# Patient Record
Sex: Male | Born: 1955
Health system: Southern US, Community
[De-identification: ages and names within clinical notes are randomized; demographics above are authoritative.]

## PROBLEM LIST (undated history)

## (undated) DIAGNOSIS — Z87442 Personal history of urinary calculi: Secondary | ICD-10-CM

## (undated) DIAGNOSIS — Z860100 Personal history of colon polyps, unspecified: Secondary | ICD-10-CM

## (undated) DIAGNOSIS — K219 Gastro-esophageal reflux disease without esophagitis: Secondary | ICD-10-CM

## (undated) DIAGNOSIS — F419 Anxiety disorder, unspecified: Secondary | ICD-10-CM

## (undated) DIAGNOSIS — N200 Calculus of kidney: Secondary | ICD-10-CM

## (undated) DIAGNOSIS — E785 Hyperlipidemia, unspecified: Secondary | ICD-10-CM

## (undated) DIAGNOSIS — C801 Malignant (primary) neoplasm, unspecified: Secondary | ICD-10-CM

## (undated) DIAGNOSIS — Z85828 Personal history of other malignant neoplasm of skin: Secondary | ICD-10-CM

## (undated) DIAGNOSIS — I4510 Unspecified right bundle-branch block: Secondary | ICD-10-CM

## (undated) DIAGNOSIS — F32A Depression, unspecified: Secondary | ICD-10-CM

## (undated) DIAGNOSIS — M1990 Unspecified osteoarthritis, unspecified site: Secondary | ICD-10-CM

## (undated) HISTORY — DX: Malignant (primary) neoplasm, unspecified: C80.1

## (undated) HISTORY — DX: Hyperlipidemia, unspecified: E78.5

## (undated) HISTORY — PX: SHOULDER SURGERY: SHX246

## (undated) HISTORY — PX: COLONOSCOPY: SHX174

## (undated) HISTORY — PX: CARDIOVASCULAR STRESS TEST: SHX262

## (undated) HISTORY — DX: Calculus of kidney: N20.0

## (undated) HISTORY — DX: Depression, unspecified: F32.A

## (undated) HISTORY — DX: Personal history of other malignant neoplasm of skin: Z85.828

## (undated) HISTORY — PX: TONSILLECTOMY AND ADENOIDECTOMY: SUR1326

## (undated) HISTORY — DX: Personal history of colonic polyps: Z86.010

## (undated) HISTORY — DX: Gastro-esophageal reflux disease without esophagitis: K21.9

## (undated) HISTORY — DX: Personal history of colon polyps, unspecified: Z86.0100

## (undated) HISTORY — PX: UPPER GASTROINTESTINAL ENDOSCOPY: SHX188

---

## 1998-10-05 ENCOUNTER — Encounter (HOSPITAL_COMMUNITY): Admission: RE | Admit: 1998-10-05 | Discharge: 1999-01-03 | Payer: Self-pay

## 2005-02-28 ENCOUNTER — Ambulatory Visit: Payer: Self-pay | Admitting: Internal Medicine

## 2005-06-27 ENCOUNTER — Ambulatory Visit: Payer: Self-pay | Admitting: Internal Medicine

## 2005-07-04 ENCOUNTER — Ambulatory Visit: Payer: Self-pay | Admitting: Internal Medicine

## 2005-07-11 ENCOUNTER — Ambulatory Visit: Payer: Self-pay | Admitting: Internal Medicine

## 2005-07-25 ENCOUNTER — Ambulatory Visit: Payer: Self-pay | Admitting: Internal Medicine

## 2005-09-07 ENCOUNTER — Ambulatory Visit: Payer: Self-pay | Admitting: Internal Medicine

## 2005-09-22 ENCOUNTER — Ambulatory Visit: Payer: Self-pay | Admitting: Internal Medicine

## 2005-11-17 ENCOUNTER — Ambulatory Visit: Payer: Self-pay | Admitting: Internal Medicine

## 2005-12-01 ENCOUNTER — Ambulatory Visit: Payer: Self-pay | Admitting: Internal Medicine

## 2006-01-16 ENCOUNTER — Ambulatory Visit: Payer: Self-pay | Admitting: Internal Medicine

## 2006-02-23 ENCOUNTER — Ambulatory Visit: Payer: Self-pay | Admitting: Internal Medicine

## 2006-06-06 ENCOUNTER — Ambulatory Visit: Payer: Self-pay | Admitting: Internal Medicine

## 2006-07-02 ENCOUNTER — Ambulatory Visit: Payer: Self-pay | Admitting: Internal Medicine

## 2006-08-14 DIAGNOSIS — E785 Hyperlipidemia, unspecified: Secondary | ICD-10-CM | POA: Insufficient documentation

## 2006-08-14 DIAGNOSIS — R1013 Epigastric pain: Secondary | ICD-10-CM

## 2006-08-14 DIAGNOSIS — K3189 Other diseases of stomach and duodenum: Secondary | ICD-10-CM

## 2006-10-05 ENCOUNTER — Ambulatory Visit: Payer: Self-pay | Admitting: Internal Medicine

## 2007-03-19 ENCOUNTER — Telehealth (INDEPENDENT_AMBULATORY_CARE_PROVIDER_SITE_OTHER): Payer: Self-pay | Admitting: *Deleted

## 2007-04-01 ENCOUNTER — Ambulatory Visit: Payer: Self-pay | Admitting: Internal Medicine

## 2007-04-02 ENCOUNTER — Encounter (INDEPENDENT_AMBULATORY_CARE_PROVIDER_SITE_OTHER): Payer: Self-pay | Admitting: *Deleted

## 2007-04-02 LAB — CONVERTED CEMR LAB
ALT: 21 units/L (ref 0–53)
Triglycerides: 58 mg/dL (ref 0–149)

## 2007-04-09 ENCOUNTER — Ambulatory Visit: Payer: Self-pay | Admitting: Internal Medicine

## 2007-04-09 LAB — CONVERTED CEMR LAB: HDL goal, serum: 40 mg/dL

## 2007-08-27 ENCOUNTER — Encounter: Payer: Self-pay | Admitting: Internal Medicine

## 2007-09-03 ENCOUNTER — Ambulatory Visit: Payer: Self-pay | Admitting: Internal Medicine

## 2007-09-03 DIAGNOSIS — R9431 Abnormal electrocardiogram [ECG] [EKG]: Secondary | ICD-10-CM

## 2007-09-04 ENCOUNTER — Telehealth: Payer: Self-pay | Admitting: Internal Medicine

## 2007-09-05 ENCOUNTER — Encounter (INDEPENDENT_AMBULATORY_CARE_PROVIDER_SITE_OTHER): Payer: Self-pay | Admitting: *Deleted

## 2007-09-06 ENCOUNTER — Encounter: Payer: Self-pay | Admitting: Internal Medicine

## 2007-09-06 ENCOUNTER — Ambulatory Visit: Payer: Self-pay

## 2007-09-09 ENCOUNTER — Telehealth (INDEPENDENT_AMBULATORY_CARE_PROVIDER_SITE_OTHER): Payer: Self-pay | Admitting: *Deleted

## 2007-09-10 ENCOUNTER — Encounter (INDEPENDENT_AMBULATORY_CARE_PROVIDER_SITE_OTHER): Payer: Self-pay | Admitting: *Deleted

## 2007-09-11 ENCOUNTER — Ambulatory Visit: Payer: Self-pay | Admitting: Gastroenterology

## 2007-09-11 ENCOUNTER — Ambulatory Visit: Payer: Self-pay | Admitting: Internal Medicine

## 2007-09-11 ENCOUNTER — Encounter (INDEPENDENT_AMBULATORY_CARE_PROVIDER_SITE_OTHER): Payer: Self-pay | Admitting: *Deleted

## 2007-09-12 ENCOUNTER — Ambulatory Visit: Payer: Self-pay | Admitting: Cardiovascular Disease

## 2007-09-17 ENCOUNTER — Encounter: Payer: Self-pay | Admitting: Internal Medicine

## 2007-09-17 ENCOUNTER — Ambulatory Visit: Payer: Self-pay | Admitting: Internal Medicine

## 2007-09-23 ENCOUNTER — Encounter: Payer: Self-pay | Admitting: Cardiovascular Disease

## 2007-09-23 ENCOUNTER — Ambulatory Visit: Payer: Self-pay

## 2007-10-08 ENCOUNTER — Ambulatory Visit: Payer: Self-pay | Admitting: Cardiovascular Disease

## 2007-10-23 ENCOUNTER — Ambulatory Visit: Payer: Self-pay | Admitting: Internal Medicine

## 2007-10-23 DIAGNOSIS — Z87442 Personal history of urinary calculi: Secondary | ICD-10-CM | POA: Insufficient documentation

## 2007-10-23 LAB — CONVERTED CEMR LAB
Nitrite: NEGATIVE
Protein, U semiquant: NEGATIVE
Urobilinogen, UA: NEGATIVE

## 2007-10-24 ENCOUNTER — Encounter: Payer: Self-pay | Admitting: Internal Medicine

## 2007-10-28 ENCOUNTER — Encounter (INDEPENDENT_AMBULATORY_CARE_PROVIDER_SITE_OTHER): Payer: Self-pay | Admitting: *Deleted

## 2007-11-21 ENCOUNTER — Ambulatory Visit: Payer: Self-pay | Admitting: Internal Medicine

## 2007-11-21 LAB — CONVERTED CEMR LAB
ALT: 27 units/L (ref 0–53)
AST: 24 units/L (ref 0–37)
LDL Cholesterol: 87 mg/dL (ref 0–99)
Total CHOL/HDL Ratio: 2.5
VLDL: 10 mg/dL (ref 0–40)

## 2007-11-26 ENCOUNTER — Encounter: Payer: Self-pay | Admitting: Internal Medicine

## 2007-11-28 ENCOUNTER — Encounter (INDEPENDENT_AMBULATORY_CARE_PROVIDER_SITE_OTHER): Payer: Self-pay | Admitting: *Deleted

## 2007-12-09 HISTORY — PX: EXTRACORPOREAL SHOCK WAVE LITHOTRIPSY: SHX1557

## 2007-12-10 ENCOUNTER — Emergency Department (HOSPITAL_COMMUNITY): Admission: EM | Admit: 2007-12-10 | Discharge: 2007-12-10 | Payer: Self-pay | Admitting: Emergency Medicine

## 2007-12-10 ENCOUNTER — Telehealth: Payer: Self-pay | Admitting: Internal Medicine

## 2007-12-12 ENCOUNTER — Encounter: Payer: Self-pay | Admitting: Internal Medicine

## 2007-12-13 ENCOUNTER — Telehealth: Payer: Self-pay | Admitting: Internal Medicine

## 2007-12-13 ENCOUNTER — Telehealth (INDEPENDENT_AMBULATORY_CARE_PROVIDER_SITE_OTHER): Payer: Self-pay | Admitting: *Deleted

## 2007-12-16 ENCOUNTER — Ambulatory Visit (HOSPITAL_COMMUNITY): Admission: RE | Admit: 2007-12-16 | Discharge: 2007-12-16 | Payer: Self-pay | Admitting: Urology

## 2007-12-17 ENCOUNTER — Telehealth (INDEPENDENT_AMBULATORY_CARE_PROVIDER_SITE_OTHER): Payer: Self-pay | Admitting: *Deleted

## 2007-12-17 ENCOUNTER — Encounter: Payer: Self-pay | Admitting: Internal Medicine

## 2007-12-17 DIAGNOSIS — K7689 Other specified diseases of liver: Secondary | ICD-10-CM

## 2007-12-18 ENCOUNTER — Telehealth: Payer: Self-pay | Admitting: Internal Medicine

## 2007-12-19 ENCOUNTER — Encounter: Payer: Self-pay | Admitting: Internal Medicine

## 2007-12-20 ENCOUNTER — Telehealth: Payer: Self-pay | Admitting: Internal Medicine

## 2007-12-24 ENCOUNTER — Telehealth (INDEPENDENT_AMBULATORY_CARE_PROVIDER_SITE_OTHER): Payer: Self-pay | Admitting: *Deleted

## 2008-01-06 ENCOUNTER — Ambulatory Visit: Payer: Self-pay | Admitting: Cardiovascular Disease

## 2008-01-14 DIAGNOSIS — I4510 Unspecified right bundle-branch block: Secondary | ICD-10-CM | POA: Insufficient documentation

## 2008-01-14 DIAGNOSIS — K219 Gastro-esophageal reflux disease without esophagitis: Secondary | ICD-10-CM

## 2008-01-15 ENCOUNTER — Ambulatory Visit: Payer: Self-pay | Admitting: Internal Medicine

## 2008-01-29 ENCOUNTER — Telehealth (INDEPENDENT_AMBULATORY_CARE_PROVIDER_SITE_OTHER): Payer: Self-pay | Admitting: *Deleted

## 2008-02-20 ENCOUNTER — Telehealth: Payer: Self-pay | Admitting: Internal Medicine

## 2008-02-21 ENCOUNTER — Encounter: Payer: Self-pay | Admitting: Internal Medicine

## 2008-03-27 ENCOUNTER — Ambulatory Visit: Payer: Self-pay | Admitting: *Deleted

## 2008-03-27 LAB — CONVERTED CEMR LAB
Bilirubin Urine: NEGATIVE
Glucose, Urine, Semiquant: NEGATIVE
Nitrite: NEGATIVE
Protein, U semiquant: NEGATIVE
Specific Gravity, Urine: 1.015
pH: 6

## 2008-03-28 ENCOUNTER — Encounter (INDEPENDENT_AMBULATORY_CARE_PROVIDER_SITE_OTHER): Payer: Self-pay | Admitting: *Deleted

## 2008-04-04 ENCOUNTER — Ambulatory Visit (HOSPITAL_COMMUNITY): Admission: EM | Admit: 2008-04-04 | Discharge: 2008-04-04 | Payer: Self-pay | Admitting: Emergency Medicine

## 2008-04-04 HISTORY — PX: OTHER SURGICAL HISTORY: SHX169

## 2008-04-06 ENCOUNTER — Telehealth (INDEPENDENT_AMBULATORY_CARE_PROVIDER_SITE_OTHER): Payer: Self-pay | Admitting: *Deleted

## 2008-06-26 ENCOUNTER — Ambulatory Visit: Payer: Self-pay | Admitting: Internal Medicine

## 2008-06-30 ENCOUNTER — Telehealth (INDEPENDENT_AMBULATORY_CARE_PROVIDER_SITE_OTHER): Payer: Self-pay | Admitting: *Deleted

## 2008-07-13 ENCOUNTER — Encounter: Payer: Self-pay | Admitting: Cardiovascular Disease

## 2008-07-13 ENCOUNTER — Ambulatory Visit: Payer: Self-pay | Admitting: Cardiovascular Disease

## 2008-07-22 ENCOUNTER — Telehealth (INDEPENDENT_AMBULATORY_CARE_PROVIDER_SITE_OTHER): Payer: Self-pay | Admitting: *Deleted

## 2008-08-28 ENCOUNTER — Ambulatory Visit: Payer: Self-pay | Admitting: Internal Medicine

## 2008-08-28 DIAGNOSIS — Z86010 Personal history of colonic polyps: Secondary | ICD-10-CM

## 2008-08-28 LAB — CONVERTED CEMR LAB: LDL Goal: 100 mg/dL

## 2008-09-13 LAB — CONVERTED CEMR LAB
Alkaline Phosphatase: 58 units/L (ref 39–117)
Bilirubin, Direct: 0.1 mg/dL (ref 0.0–0.3)
Eosinophils Absolute: 0.1 10*3/uL (ref 0.0–0.7)
GFR calc Af Amer: 101 mL/min
GFR calc non Af Amer: 83 mL/min
HCT: 46.4 % (ref 39.0–52.0)
HDL: 70.7 mg/dL (ref >39.0)
Monocytes Absolute: 0.5 10*3/uL (ref 0.1–1.0)
Monocytes Relative: 8 % (ref 3.0–12.0)
Platelets: 192 10*3/uL (ref 150–400)
Potassium: 4.7 meq/L (ref 3.5–5.1)
RDW: 12.6 % (ref 11.5–14.6)
Sodium: 141 meq/L (ref 135–145)
Total CHOL/HDL Ratio: 2.4
Triglycerides: 57 mg/dL (ref 0–149)
VLDL: 11 mg/dL (ref 0–40)

## 2008-09-14 ENCOUNTER — Encounter (INDEPENDENT_AMBULATORY_CARE_PROVIDER_SITE_OTHER): Payer: Self-pay | Admitting: *Deleted

## 2008-11-08 IMAGING — CT CT PELVIS W/O CM
2 of 4 series · 17 of 46 positions shown, 19 images · non-contrast
Comparison: 12/10/2007

CT ABDOMEN

CLINICAL DATA: Left flank pain.  History of renal calculi.

CT ABDOMEN AND PELVIS WITHOUT CONTRAST
TECHNIQUE: Multidetector CT imaging of the abdomen and pelvis was
performed following the standard
protocol without intravenous contrast.

[Series 2: stone_wo 5.0 b40f st · axial · 0.71mm/px · z∈[-486,-74]mm · 14 of 113 slices shown, 16 images]
[im 5/113  soft-tissue]
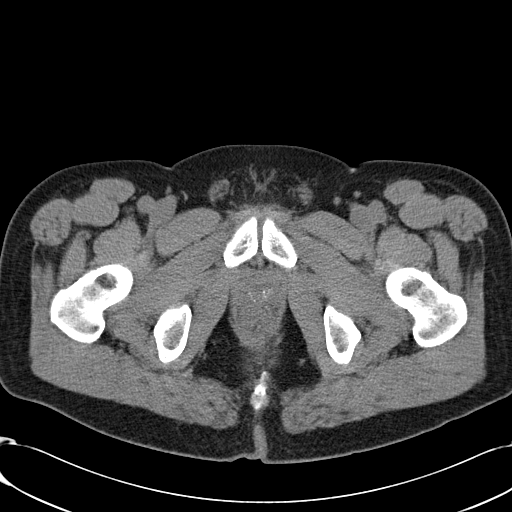
[im 5/113  bone]
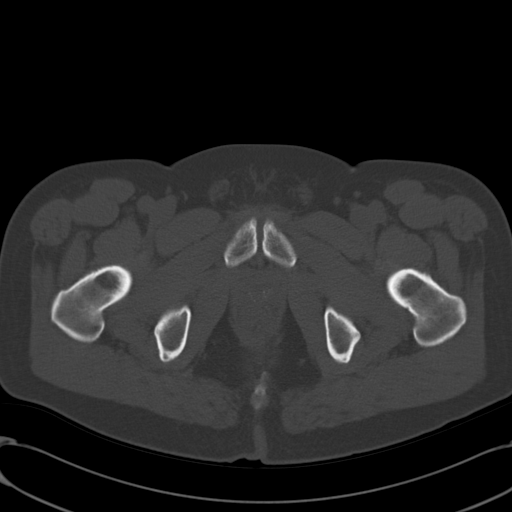
[im 15/113  soft-tissue]
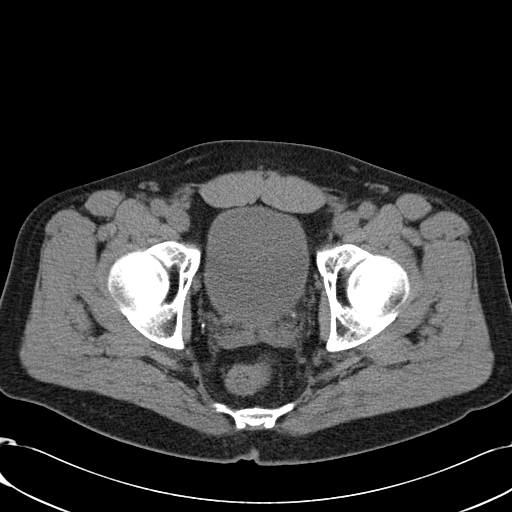
[im 24/113  soft-tissue]
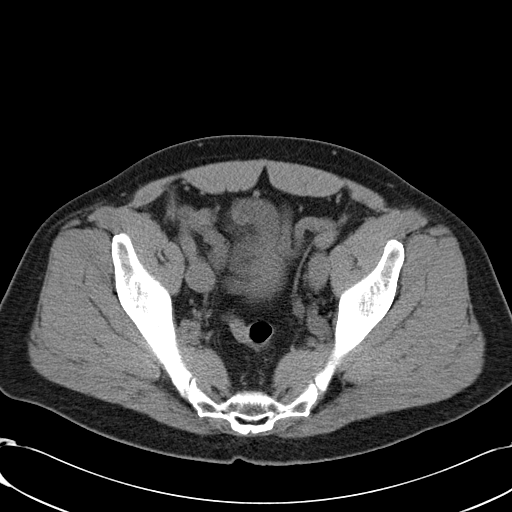
[im 29/113  soft-tissue]
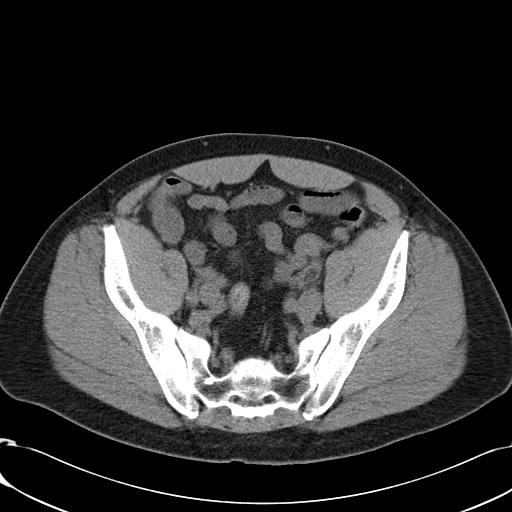
[im 38/113  soft-tissue]
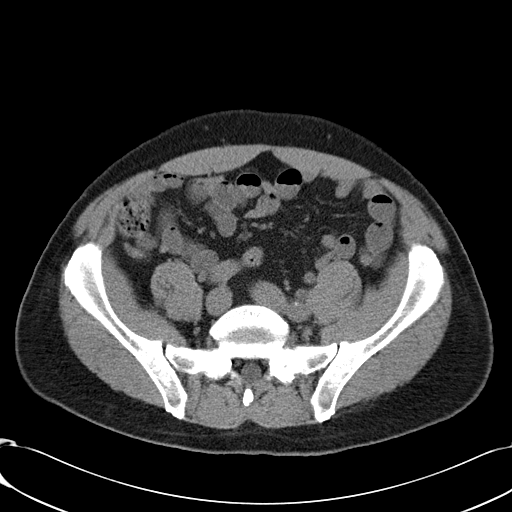
[im 47/113  soft-tissue]
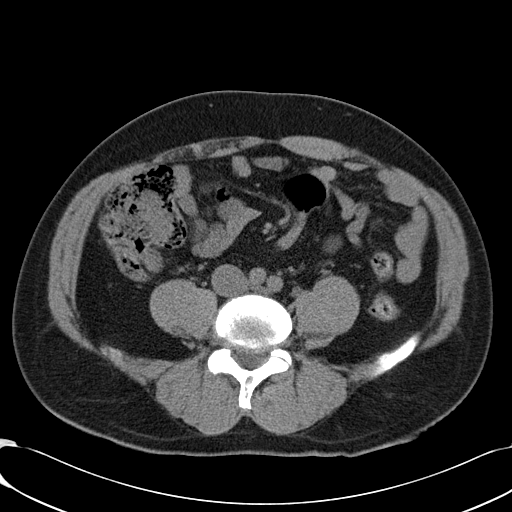
[im 52/113  soft-tissue]
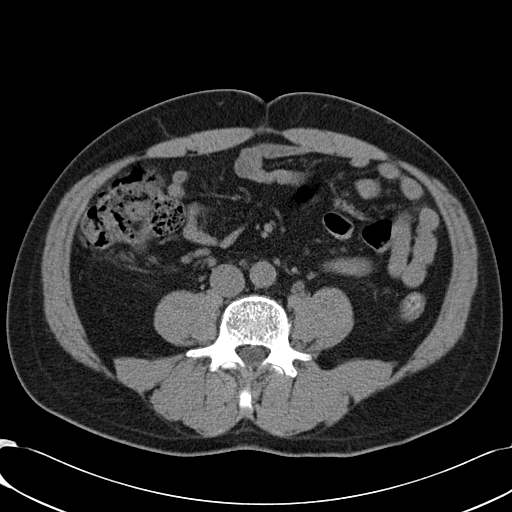
[im 61/113  soft-tissue]
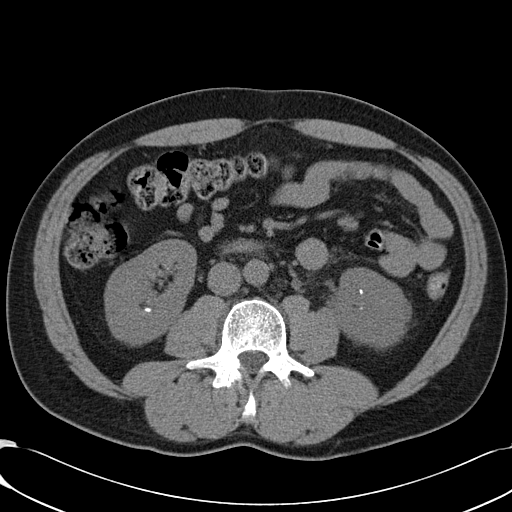
[im 66/113  soft-tissue]
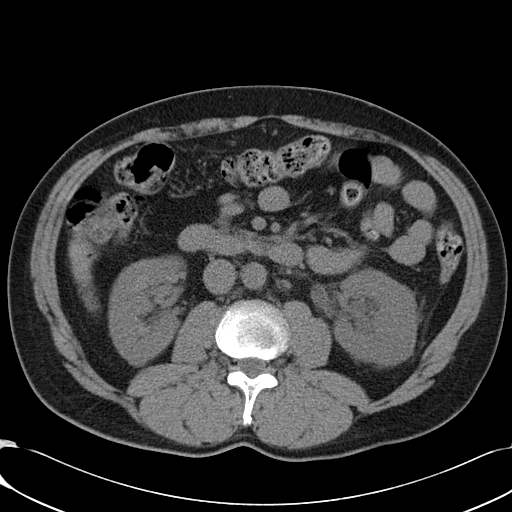
[im 66/113  bone]
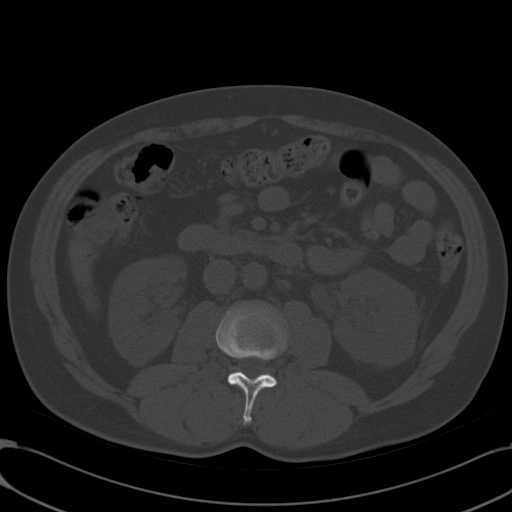
[im 75/113  soft-tissue]
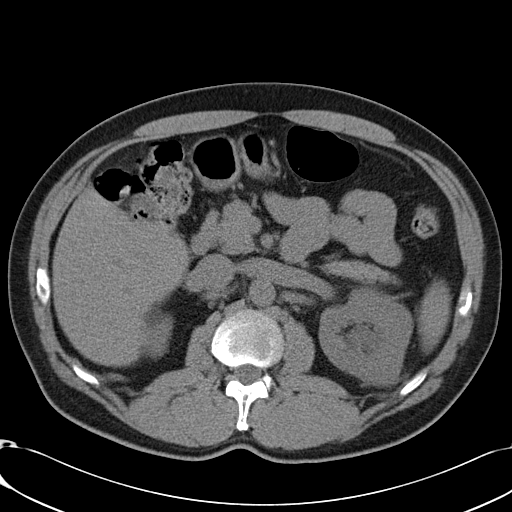
[im 85/113  soft-tissue]
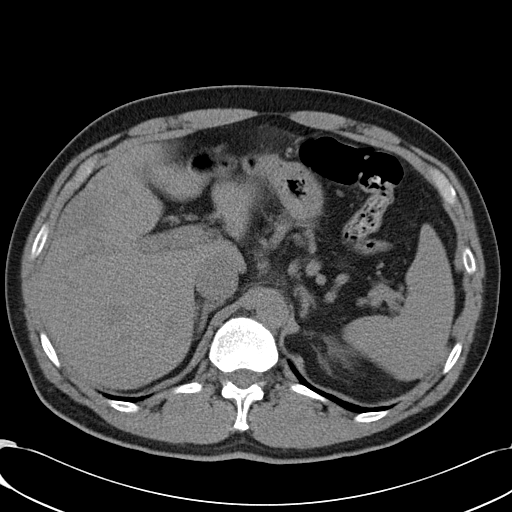
[im 89/113  soft-tissue]
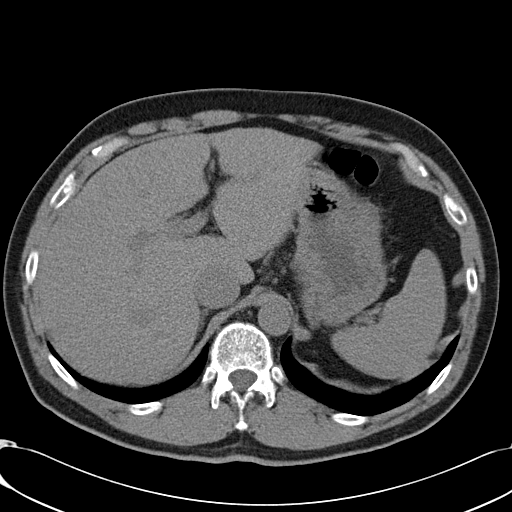
[im 99/113  soft-tissue]
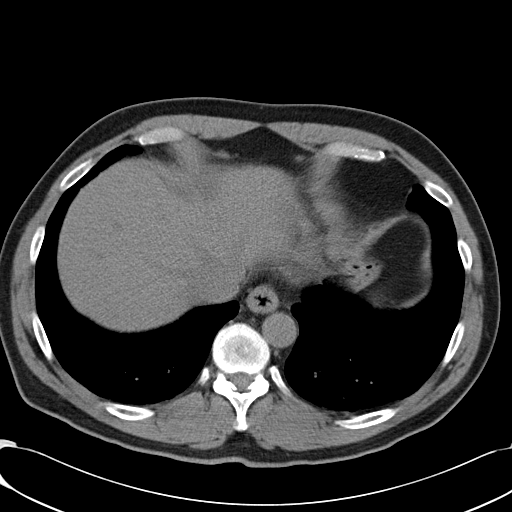
[im 108/113  soft-tissue]
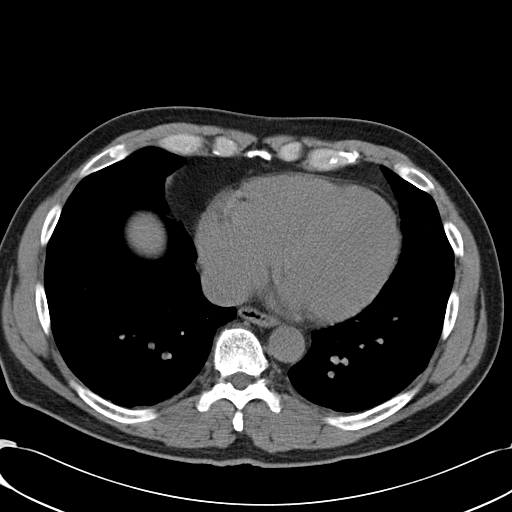

[Series 602: coronal abdomen · coronal · 0.92mm/px · 3 of 120 slices shown]
[im 40/120  soft-tissue]
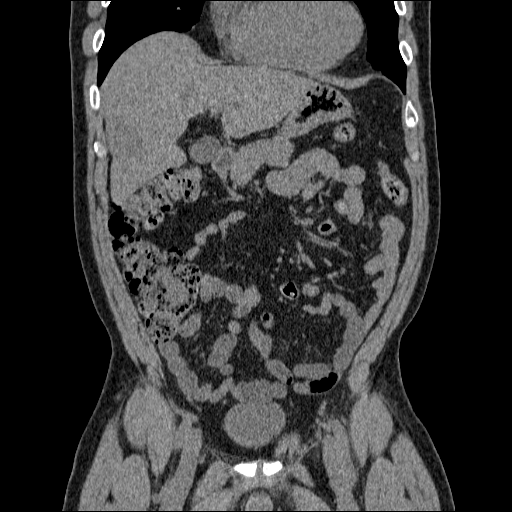
[im 53/120  soft-tissue]
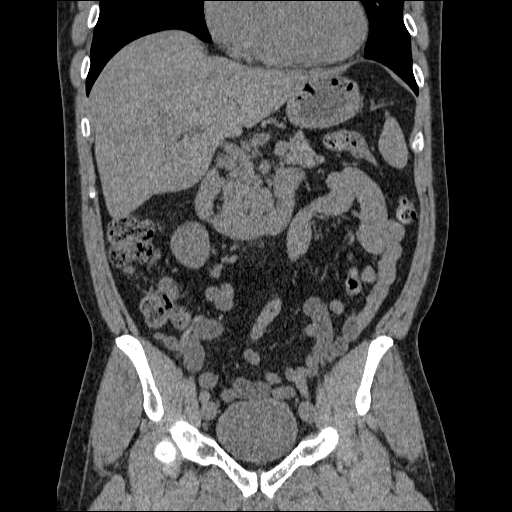
[im 67/120  soft-tissue]
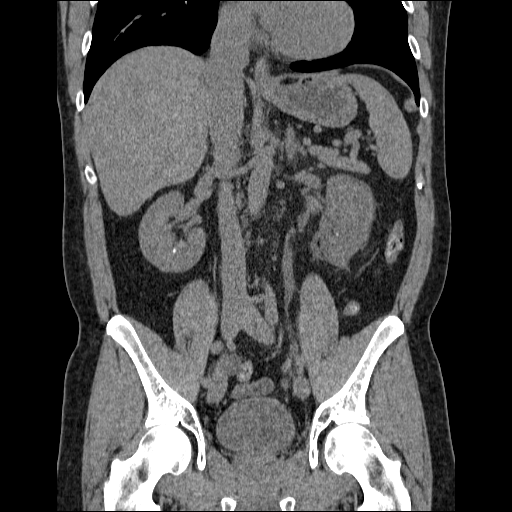

[17 of 46 positions shown; findings below may reference images not displayed]

FINDINGS: There is left hydronephrosis with two tiny stones in the
lower pole of the left kidney with some slight left perinephric
stranding.  Two small stones are in the lower pole of the right
kidney, unchanged.  The left ureter is dilated into the pelvis.

There is a 10 mm lesion in the anterior aspect of the left lobe of
the liver and there is a 3.6 cm low density lesion in the anterior
aspect of the right lobe of the liver.  These are unchanged since
the prior exam. They remain indeterminate in etiology.  Ultrasound
may characterize these lesions.

The spleen, pancreas, and adrenal glands are normal.  No
adenopathy.
IMPRESSION: 1. Bilateral renal calculi.  Left hydronephrosis with dilatation of
the ureter into the pelvis.
2.  Indeterminate liver lesions, stable since the prior exam.
Ultrasound on an elective basis may help to further characterize.

CT PELVIS
FINDINGS: There is a 3.2 mm stone at the left ureteral vesicle
junction obstructing the distal left ureter.  This calcification is
visible on the scout radiograph.

No other significant abnormality of the pelvis.  No diverticular
disease.  Calcifications seen in the prostate gland.
IMPRESSION: 3.2 mm stone obstructing the distal left ureter at the level of the
left ureteral vesicle junction.  The stone is visible on the scout
image.

## 2008-11-08 IMAGING — CR DG CHEST 1V PORT
1 series · 1 of 1 positions shown · non-contrast
Comparison: 04/04/2008,

CLINICAL DATA: 51-year-old male left flank pain

PORTABLE CHEST - 1 VIEW

[view not recorded]
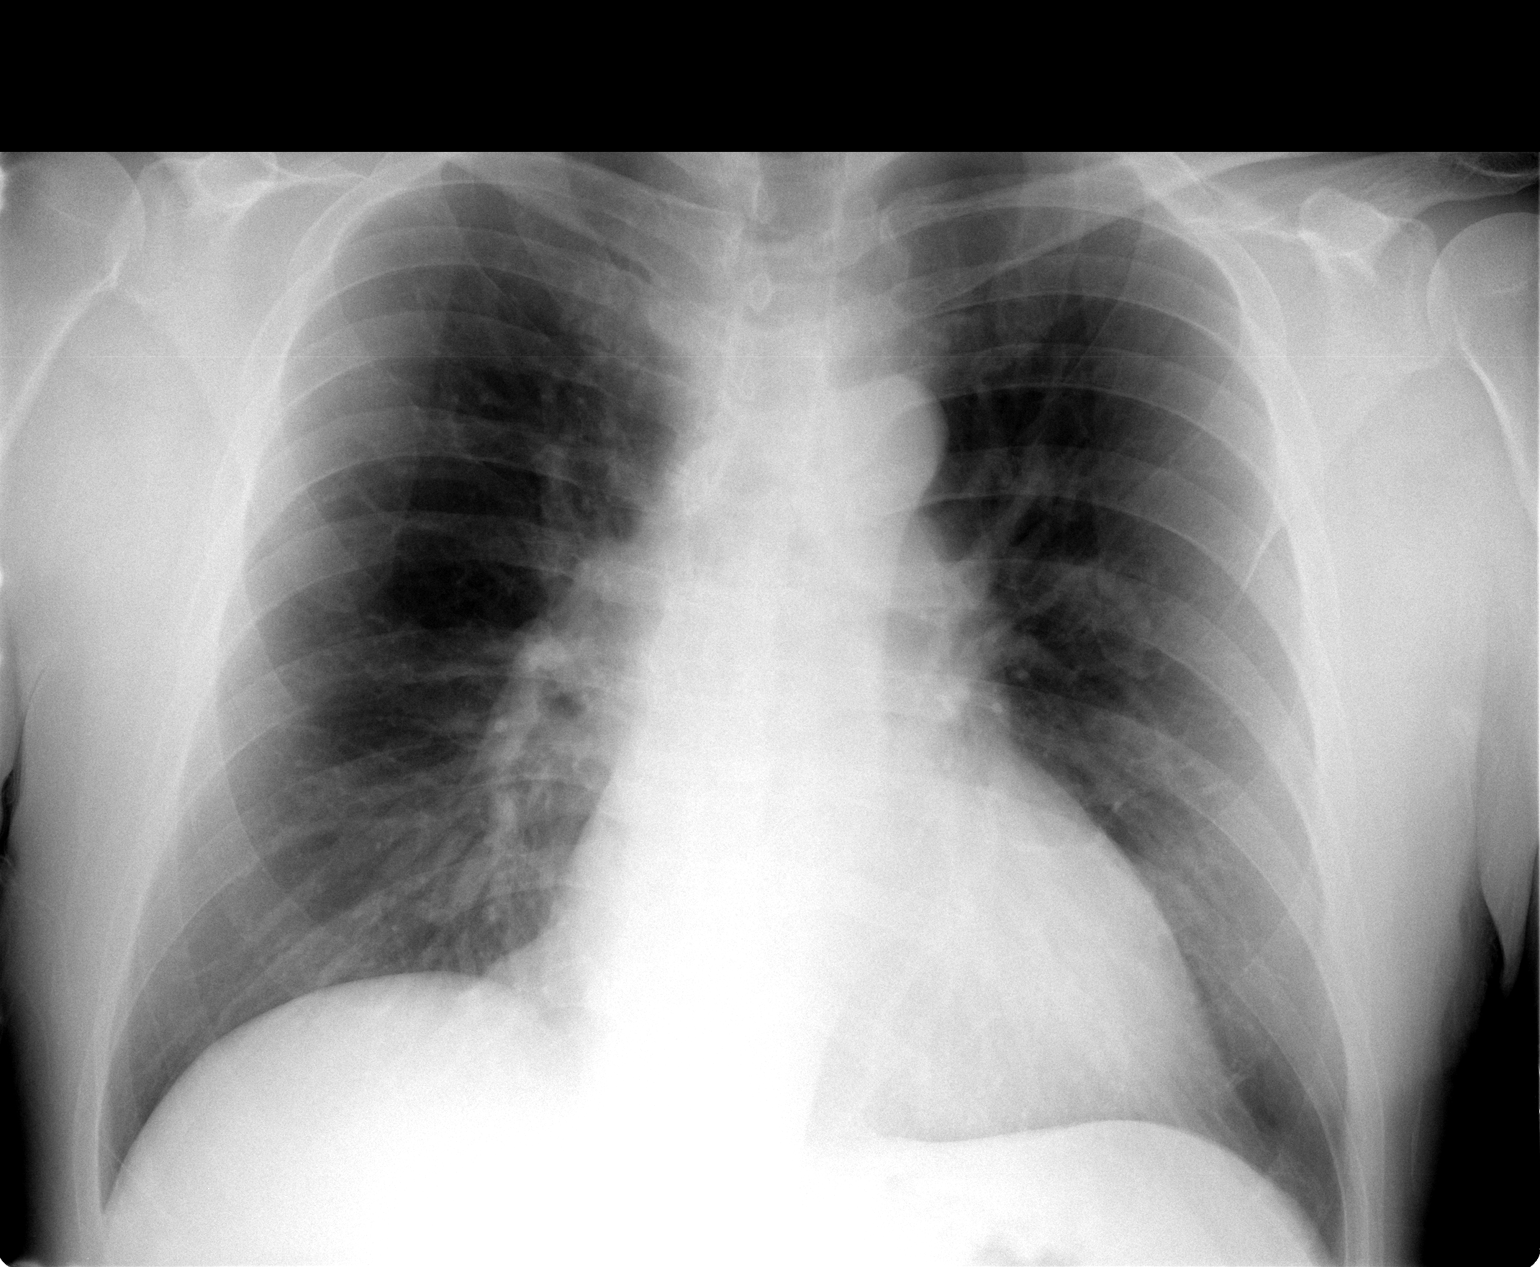

[1 of 1 positions shown; findings below may reference images not displayed]

FINDINGS: Prominent heart size and vascular markings related to
portable AP technique.  No focal pneumonia, edema, effusion or
pneumothorax.  Trachea is midline.
IMPRESSION: No acute finding.

## 2009-01-26 ENCOUNTER — Telehealth (INDEPENDENT_AMBULATORY_CARE_PROVIDER_SITE_OTHER): Payer: Self-pay | Admitting: *Deleted

## 2009-01-27 ENCOUNTER — Telehealth (INDEPENDENT_AMBULATORY_CARE_PROVIDER_SITE_OTHER): Payer: Self-pay | Admitting: *Deleted

## 2009-02-26 ENCOUNTER — Encounter (INDEPENDENT_AMBULATORY_CARE_PROVIDER_SITE_OTHER): Payer: Self-pay | Admitting: *Deleted

## 2009-04-18 ENCOUNTER — Emergency Department (HOSPITAL_BASED_OUTPATIENT_CLINIC_OR_DEPARTMENT_OTHER): Admission: EM | Admit: 2009-04-18 | Discharge: 2009-04-18 | Payer: Self-pay | Admitting: Emergency Medicine

## 2009-08-19 ENCOUNTER — Ambulatory Visit: Payer: Self-pay | Admitting: Internal Medicine

## 2009-08-30 ENCOUNTER — Telehealth: Payer: Self-pay | Admitting: Internal Medicine

## 2009-11-26 ENCOUNTER — Ambulatory Visit: Payer: Self-pay | Admitting: Internal Medicine

## 2009-11-29 LAB — CONVERTED CEMR LAB
Basophils Relative: 0.5 % (ref 0.0–3.0)
Eosinophils Relative: 3.9 % (ref 0.0–5.0)
HCT: 43.3 % (ref 39.0–52.0)
Hemoglobin: 14.5 g/dL (ref 13.0–17.0)
Lymphs Abs: 1.5 10*3/uL (ref 0.7–4.0)
MCV: 90.4 fL (ref 78.0–100.0)
Monocytes Absolute: 0.8 10*3/uL (ref 0.1–1.0)
RBC: 4.78 M/uL (ref 4.22–5.81)
WBC: 6.9 10*3/uL (ref 4.5–10.5)

## 2010-05-05 ENCOUNTER — Ambulatory Visit: Payer: Self-pay | Admitting: Internal Medicine

## 2010-05-05 ENCOUNTER — Encounter: Payer: Self-pay | Admitting: Internal Medicine

## 2010-05-05 DIAGNOSIS — R7309 Other abnormal glucose: Secondary | ICD-10-CM

## 2010-05-05 DIAGNOSIS — M545 Low back pain: Secondary | ICD-10-CM | POA: Insufficient documentation

## 2010-05-05 LAB — CONVERTED CEMR LAB
Bilirubin Urine: NEGATIVE
Protein, U semiquant: NEGATIVE
Urobilinogen, UA: 0.2
WBC Urine, dipstick: NEGATIVE

## 2010-05-06 ENCOUNTER — Telehealth (INDEPENDENT_AMBULATORY_CARE_PROVIDER_SITE_OTHER): Payer: Self-pay | Admitting: *Deleted

## 2010-05-06 LAB — CONVERTED CEMR LAB: Hgb A1c MFr Bld: 5.4 % (ref 4.6–6.5)

## 2010-05-11 ENCOUNTER — Ambulatory Visit: Payer: Self-pay | Admitting: Internal Medicine

## 2010-05-17 ENCOUNTER — Telehealth (INDEPENDENT_AMBULATORY_CARE_PROVIDER_SITE_OTHER): Payer: Self-pay | Admitting: *Deleted

## 2010-05-25 ENCOUNTER — Encounter (INDEPENDENT_AMBULATORY_CARE_PROVIDER_SITE_OTHER): Payer: Self-pay | Admitting: *Deleted

## 2010-06-16 ENCOUNTER — Ambulatory Visit: Payer: Self-pay | Admitting: Internal Medicine

## 2010-06-17 ENCOUNTER — Encounter (INDEPENDENT_AMBULATORY_CARE_PROVIDER_SITE_OTHER): Payer: Self-pay

## 2010-06-19 LAB — CONVERTED CEMR LAB
ALT: 38 units/L (ref 0–53)
Albumin: 4.2 g/dL (ref 3.5–5.2)
Basophils Relative: 0.7 % (ref 0.0–3.0)
Eosinophils Relative: 5.2 % — ABNORMAL HIGH (ref 0.0–5.0)
GFR calc non Af Amer: 82.43 mL/min (ref >60.00)
Glucose, Bld: 100 mg/dL — ABNORMAL HIGH (ref 70–99)
HCT: 44.7 % (ref 39.0–52.0)
HDL: 50.3 mg/dL (ref >39.00)
Hemoglobin: 15.3 g/dL (ref 13.0–17.0)
Lymphs Abs: 1.7 10*3/uL (ref 0.7–4.0)
Monocytes Relative: 11.2 % (ref 3.0–12.0)
Neutro Abs: 3.7 10*3/uL (ref 1.4–7.7)
Potassium: 4.4 meq/L (ref 3.5–5.1)
RBC: 4.92 M/uL (ref 4.22–5.81)
Sodium: 137 meq/L (ref 135–145)
Total Bilirubin: 0.5 mg/dL (ref 0.3–1.2)
VLDL: 18 mg/dL (ref 0.0–40.0)
WBC: 6.5 10*3/uL (ref 4.5–10.5)

## 2010-06-21 ENCOUNTER — Ambulatory Visit: Payer: Self-pay | Admitting: Internal Medicine

## 2010-06-23 ENCOUNTER — Ambulatory Visit: Payer: Self-pay | Admitting: Internal Medicine

## 2010-06-23 ENCOUNTER — Encounter: Payer: Self-pay | Admitting: Internal Medicine

## 2010-07-06 ENCOUNTER — Ambulatory Visit: Payer: Self-pay | Admitting: Internal Medicine

## 2010-07-06 ENCOUNTER — Telehealth: Payer: Self-pay | Admitting: Internal Medicine

## 2010-08-04 ENCOUNTER — Telehealth: Payer: Self-pay | Admitting: Internal Medicine

## 2010-08-05 ENCOUNTER — Other Ambulatory Visit: Payer: Self-pay | Admitting: Internal Medicine

## 2010-08-05 ENCOUNTER — Ambulatory Visit
Admission: RE | Admit: 2010-08-05 | Discharge: 2010-08-05 | Payer: Self-pay | Source: Home / Self Care | Attending: Internal Medicine | Admitting: Internal Medicine

## 2010-08-05 ENCOUNTER — Ambulatory Visit: Admit: 2010-08-05 | Payer: Self-pay | Admitting: Internal Medicine

## 2010-08-05 LAB — CARDIAC PANEL
CK-MB: 3.3 ng/mL (ref 0.3–4.0)
Relative Index: 1.4 calc (ref 0.0–2.5)

## 2010-08-07 LAB — CONVERTED CEMR LAB
Basophils Relative: 1.2 % — ABNORMAL HIGH (ref 0.0–1.0)
Eosinophils Absolute: 0.1 10*3/uL (ref 0.0–0.6)
Eosinophils Relative: 2.5 % (ref 0.0–5.0)
Hemoglobin: 14.2 g/dL (ref 13.0–17.0)
Lymphocytes Relative: 31.8 % (ref 12.0–46.0)
MCV: 91.9 fL (ref 78.0–100.0)
Monocytes Absolute: 0.5 10*3/uL (ref 0.2–0.7)
Neutro Abs: 3.2 10*3/uL (ref 1.4–7.7)
WBC: 5.7 10*3/uL (ref 4.5–10.5)

## 2010-08-09 NOTE — Assessment & Plan Note (Signed)
Summary: pain in back//hx of kidney stones//lch   Vital Signs:  Patient profile:   55 year old male Weight:      205 pounds Temp:     98.5 degrees F oral Pulse rate:   80 / minute Resp:     17 per minute BP sitting:   118 / 78  (left arm)  Vitals Entered By: Jeremy Johann CMA (Nov 26, 2009 12:44 PM) CC: mid back painx1week, Back pain Comments REVIEWED MED LIST, PATIENT AGREED DOSE AND INSTRUCTION CORRECT    Primary Care Provider:  Chrissie Noa Hopper,MD  CC:  mid back painx1week and Back pain.  History of Present Illness: A friend of his wife stated her husband's back pain was a "serious blood disorder"; therefore , his wife made him an appt. He  presents with Back pain for 1&1/2 weeks w/o definite trigger.  The patient denies fever, chills, weakness, loss of sensation, fecal incontinence, urinary incontinence, urinary retention, dysuria, rest pain, inability to work, and inability to care for self.  The pain is located in the right thoracic back, "the size of a quarter".  The pain began at home and suddenly.  The pain is made worse by flexion & rolling over in bed.  The pain is made better by inactivity.  Risk factors for serious underlying conditions include age >= 50 years.  PMH of renal calculi; "this is nothing like that"  Allergies: 1)  ! Codeine  Review of Systems General:  Denies sweats and weight loss. ENT:  Denies nosebleeds. Resp:  Denies chest pain with inspiration, coughing up blood, and shortness of breath. GI:  Denies abdominal pain, bloody stools, dark tarry stools, and indigestion. GU:  Denies discharge and hematuria. Derm:  Denies lesion(s) and rash. Neuro:  Denies brief paralysis, numbness, tingling, and weakness. Heme:  Denies abnormal bruising and bleeding.  Physical Exam  General:  in no acute distress; alert,appropriate and cooperative throughout examination Eyes:  No corneal or conjunctival inflammation noted.No icterus Chest Wall:  No pain to  compression Lungs:  Normal respiratory effort, chest expands symmetrically. Lungs are clear to auscultation, no crackles or wheezes. Heart:  Normal rate and regular rhythm. S1 and S2 normal without gallop, murmur, click, rub. Abdomen:  Bowel sounds positive,abdomen soft and non-tender without masses, organomegaly or hernias noted. Msk:  No deformity or scoliosis noted of thoracic or lumbar spine.   Sat w/o help. No pain to percussion over spine Extremities:  No clubbing, cyanosis, edema. Neg SLR . Neg Homan's Neurologic:  strength normal in all extremities and DTRs symmetrical and normal.   Skin:  Intact without suspicious lesions or rashes Cervical Nodes:  No lymphadenopathy noted   Impression & Recommendations:  Problem # 1:  BACK PAIN, THORACIC REGION, RIGHT (ICD-724.1)  positional in T6 distribution His updated medication list for this problem includes:    Aspirin Ec 81 Mg Tbec (Aspirin) .Marland Kitchen... Take 1 tablet by mouth every morning    Cyclobenzaprine Hcl 5 Mg Tabs (Cyclobenzaprine hcl) .Marland Kitchen... 1-2 at bedtime as needed back pain  Orders: T-Thoracic Spine 2 Views [phone]) Venipuncture (45409) TLB-CBC Platelet - w/Differential (85025-CBCD)  Complete Medication List: 1)  Aspirin Ec 81 Mg Tbec (Aspirin) .... Take 1 tablet by mouth every morning 2)  Lorazepam 1 Mg Tabs (Lorazepam) .... Take 1/2-1 tablet by mouth three times a day 3)  Omeprazole 20 Mg Cpdr (Omeprazole) .... Take one tablet daily 4)  Budeprion Xl 300 Mg Tb24 (Bupropion hcl) .... Take one tablet daily 5)  Fish Oil 1000 Mg Caps (Omega-3 fatty acids) .... Take 1 capsule by mouth once a day 6)  Crestor 20 Mg Tabs (Rosuvastatin calcium) .Marland Kitchen.. 1 qd 7)  Fluticasone Propionate 50 Mcg/act Susp (Fluticasone propionate) .Marland Kitchen.. 1 spray two times a day as needed 8)  Cyclobenzaprine Hcl 5 Mg Tabs (Cyclobenzaprine hcl) .Marland Kitchen.. 1-2 at bedtime as needed back pain  Patient Instructions: 1)  Aleve 1-2 every 8-12 hrs as needed wiith  food. Prescriptions: CYCLOBENZAPRINE HCL 5 MG TABS (CYCLOBENZAPRINE HCL) 1-2 at bedtime as needed back pain  #20 x 0   Entered and Authorized by:   Marga Melnick MD   Signed by:   Marga Melnick MD on 11/26/2009   Method used:   Faxed to ...       [facility] [address] [phone]* (retail)       [address]       [place], [place]  [address]       Ph: [phone]       Fax: [phone]   RxID:   [id number] CYCLOBENZAPRINE HCL 5 MG TABS (CYCLOBENZAPRINE HCL) 1-2 at bedtime as needed back pain  #20 x 0   Entered and Authorized by:   Marga Melnick MD   Signed by:   Marga Melnick MD on 11/26/2009   Method used:   Faxed to ...       [facility] [phone]* (retail)       [address]       [place], [place]  [address]       Ph: [phone]       Fax: [phone]   RxID:   [id number]

## 2010-08-09 NOTE — Progress Notes (Signed)
Summary: Refill Request  Phone Note Refill Request Call back at [phone] Message from:  Pharmacy  Refills Requested: Medication #1:  CRESTOR 20 MG  TABS 1 qd. [facility] [phone]   Method Requested: Fax to Mail Away Pharmacy Initial call taken by: Shonna Chock CMA,  May 17, 2010 4:52 PM  Follow-up for Phone Call        Paper Request faxed back Follow-up by: Shonna Chock CMA,  May 17, 2010 4:55 PM    Prescriptions: CRESTOR 20 MG  TABS (ROSUVASTATIN CALCIUM) 1 qd  #90 x 0   Entered by:   Shonna Chock CMA   Authorized by:   Marga Melnick MD   Signed by:   Shonna Chock CMA on 05/17/2010   Method used:   Historical   RxID:   [id number]

## 2010-08-09 NOTE — Miscellaneous (Signed)
Summary: Orders Update   Clinical Lists Changes  Orders: Added new Service order of Venipuncture (96045) - Signed Added new Test order of TLB-A1C / Hgb A1C (Glycohemoglobin) (83036-A1C) - Signed

## 2010-08-09 NOTE — Progress Notes (Signed)
Summary: need lab orders for 12/8  Phone Note Call from Patient Call back at Work Phone [phone]   Caller: Patient Summary of Call: set up CPX for patient for 06-23-2010 in afternoon---set up fasting labs for 12/8---what orders and codes do you want?? Initial call taken by: Jerolyn Shin,  May 06, 2010 4:17 PM  Follow-up for Phone Call        Lipid,Hep,BMP,CBCD,TSH,PSA,Stool Cards V70.0/272.4/995.20 Follow-up by: Shonna Chock CMA,  May 06, 2010 4:31 PM  Additional Follow-up for Phone Call Additional follow up Details #1::        added lab codes and orders.Jerolyn Shin  05-13-10 9:30 AM

## 2010-08-09 NOTE — Progress Notes (Signed)
Summary: Refill request  Phone Note Refill Request Message from:  Pharmacy  Refills Requested: Medication #1:  CRESTOR 20 MG  TABS 1 qd. [facility] [phone]   Method Requested: Fax to Mail Away Pharmacy Initial call taken by: Shonna Chock CMA,  May 06, 2010 9:27 AM  Follow-up for Phone Call        Patient needs to schedule Lipid/Hep 272.4/995.20.  ** If he would like additional labs he should schedule CPX and a full lab panel can be ordered** Follow-up by: Shonna Chock CMA,  May 06, 2010 9:30 AM    Prescriptions: CRESTOR 20 MG  TABS (ROSUVASTATIN CALCIUM) 1 qd  #90 x 0   Entered by:   Shonna Chock CMA   Authorized by:   Marga Melnick MD   Signed by:   Shonna Chock CMA on 05/06/2010   Method used:   Print then Give to Patient   RxID:   [id number]

## 2010-08-09 NOTE — Assessment & Plan Note (Signed)
Summary: SINUS INFECTION?/KDC   Vital Signs:  Patient profile:   55 year old male Weight:      206.6 pounds BMI:     29.33 Temp:     98.3 degrees F oral Pulse rate:   68 / minute Resp:     15 per minute BP sitting:   134 / 90  (left arm) Cuff size:   large  Vitals Entered By: Shonna Chock (August 19, 2009 2:45 PM) CC: Sinus Infection: Congestion,sore throat, and tooth pain Comments REVIEWED MED LIST, PATIENT AGREED DOSE AND INSTRUCTION CORRECT    Primary Care Provider:  Chrissie Noa Leanah Kolander,MD  CC:  Sinus Infection: Congestion, sore throat, and and tooth pain.  History of Present Illness: Recurrent URI symptons since 07/10/2009; initially better with OTC decongestant & Neti pot . As of 10 days ago recurrence as nasal congestion. ST as of 02/07;dental pain last night.Facial pain & frontal headache ; purulence.   Allergies: 1)  ! Codeine  Review of Systems General:  Denies chills, fever, and sweats. ENT:  Complains of nasal congestion, sinus pressure, and sore throat; denies ear discharge and earache. Resp:  Denies coughing up blood, shortness of breath, sputum productive, and wheezing. Allergy:  Denies itching eyes and sneezing.  Physical Exam  General:  well-nourished,in no acute distress; alert,appropriate and cooperative throughout examination Ears:  External ear exam shows no significant lesions or deformities.  Otoscopic examination reveals clear canals, tympanic membranes are intact bilaterally without bulging, retraction, inflammation or discharge. Hearing is grossly normal bilaterally.Minor wax Nose:  External nasal examination shows no deformity or inflammation. Nasal mucosa are pink and moist without lesions or exudates. Mouth:  Oral mucosa and oropharynx without lesions or exudates.  Teeth in good repair. Mild  pharyngeal erythema.   Lungs:  Normal respiratory effort, chest expands symmetrically. Lungs are clear to auscultation, no crackles or wheezes. Cervical Nodes:   No lymphadenopathy noted Axillary Nodes:  No palpable lymphadenopathy   Impression & Recommendations:  Problem # 1:  SINUSITIS- ACUTE-NOS (ICD-461.9)  His updated medication list for this problem includes:    Cefuroxime Axetil 500 Mg Tabs (Cefuroxime axetil) .Marland Kitchen... 1 two times a day    Fluticasone Propionate 50 Mcg/act Susp (Fluticasone propionate) .Marland Kitchen... 1 spray two times a day  Complete Medication List: 1)  Aspirin Ec 81 Mg Tbec (Aspirin) .... Take 1 tablet by mouth every morning 2)  Lorazepam 1 Mg Tabs (Lorazepam) .... Take 1/2-1 tablet by mouth three times a day 3)  Omeprazole 20 Mg Cpdr (Omeprazole) .... Take one tablet daily 4)  Budeprion Xl 300 Mg Tb24 (Bupropion hcl) .... Take one tablet daily 5)  Fish Oil 1000 Mg Caps (Omega-3 fatty acids) .... Take 1 capsule by mouth once a day 6)  Crestor 20 Mg Tabs (Rosuvastatin calcium) .Marland Kitchen.. 1 qd 7)  Cefuroxime Axetil 500 Mg Tabs (Cefuroxime axetil) .Marland Kitchen.. 1 two times a day 8)  Fluticasone Propionate 50 Mcg/act Susp (Fluticasone propionate) .Marland Kitchen.. 1 spray two times a day  Patient Instructions: 1)  Neti pot once daily - two times a day until  sinuses clear. 2)  Drink as much fluid as you can tolerate for the next few days. Prescriptions: FLUTICASONE PROPIONATE 50 MCG/ACT SUSP (FLUTICASONE PROPIONATE) 1 spray two times a day  #1 x 11   Entered and Authorized by:   Marga Melnick MD   Signed by:   Marga Melnick MD on 08/19/2009   Method used:   Faxed to .Marland KitchenMarland Kitchen  [facility] [address] [phone]* (retail)       [address]       [place], [place]  [address]       Ph: [phone]       Fax: [phone]   RxID:   [id number] CEFUROXIME AXETIL 500 MG TABS (CEFUROXIME AXETIL) 1 two times a day  #20 x 0   Entered and Authorized by:   Marga Melnick MD   Signed by:   Marga Melnick MD on 08/19/2009   Method used:   Faxed to ...       [facility] [address] #[id number]* (retail)       [address]  [address]       [place], [place]  [address]       Ph: [phone]       Fax: [phone]   RxID:   [id number]

## 2010-08-09 NOTE — Letter (Signed)
Summary: Pre Visit Letter Revised  [facility]  [address] [place], [place] [address]   Phone: [phone]  Fax: [phone]        05/25/2010 MRN: [id number]    [name] [name] [address] [place], [place]  [address]             Procedure Date:  07/06/10   Welcome to the Gastroenterology Division at [facility].    You are scheduled to see a nurse for your pre-procedure visit on 06/21/10 at 8:00 A.M. on the 3rd floor at [facility], [address]  We ask that you try to arrive at our office 15 minutes prior to your appointment time to allow for check-in.  Please take a minute to review the attached form.  If you answer "Yes" to one or more of the questions on the first page, we ask that you call the person listed at your earliest opportunity.  If you answer "No" to all of the questions, please complete the rest of the form and bring it to your appointment.    Your nurse visit will consist of discussing your medical and surgical history, your immediate family medical history, and your medications.   If you are unable to list all of your medications on the form, please bring the medication bottles to your appointment and we will list them.  We will need to be aware of both prescribed and over the counter drugs.  We will need to know exact dosage information as well.    Please be prepared to read and sign documents such as consent forms, a financial agreement, and acknowledgement forms.  If necessary, and with your consent, a friend or relative is welcome to sit-in on the nurse visit with you.  Please bring your insurance card so that we may make a copy of it.  If your insurance requires a referral to see a specialist, please bring your referral form from your primary care physician.  No co-pay is required for this nurse visit.     If you cannot keep your appointment, please call [phone] to cancel or reschedule prior to your appointment date.   This allows [place] the opportunity to schedule an appointment for another patient in need of care.    Thank you for choosing [facility] for your medical needs.  We appreciate the opportunity to care for you.  Please visit [place] at our website  to learn more about our practice.  Sincerely, The Gastroenterology Division

## 2010-08-09 NOTE — Assessment & Plan Note (Signed)
Summary: Lower Back Pain   Vital Signs:  Patient profile:   55 year old male Weight:      205.4 pounds BMI:     29.16 Temp:     98.2 degrees F oral Pulse rate:   60 / minute Resp:     12 per minute BP sitting:   116 / 64  (left arm) Cuff size:   large  Vitals Entered By: Shonna Chock CMA (May 05, 2010 8:15 AM) CC: Lower back pain, Back pain, Type 2 diabetes mellitus follow-up   Primary Care Provider:  Chrissie Noa Hopper,MD  CC:  Lower back pain, Back pain, and Type 2 diabetes mellitus follow-up.  History of Present Illness: Back Pain      This is a 55 year old man who presents with  "shooting " back pain  lasting seconds over several months.  The patient denies fever, chills, weakness, loss of sensation, fecal incontinence, urinary incontinence, urinary retention, and dysuria.  The pain is located in the right low back.  The pain began gradually w/o injury.  The pain does not radiate.  The pain is made worse by  waist flexion & lifting thigh .  The pain has not been treated.  Risk factors  of consideration  include duration of pain > 1 month and age >= 50 years. He is concerned about possible Diabetes & kidney issues. Hisfather had DM.        The patient reports weight gain, but denies polyuria, polydipsia, blurred vision, and numbness of extremities.  The patient denies the following symptoms: neuropathic pain, orthostatic symptoms, poor wound healing, intermittent claudication, vision loss, and foot ulcer.  Since the last visit the patient reports fair  dietary compliance and exercising regularly(3X/week).  Since the last visit, the patient reports having had eye care by an Ophthalmologist( no retinopathy)  Current Medications (verified): 1)  Aspirin Ec 81 Mg Tbec (Aspirin) .... Take 1 Tablet By Mouth Every Morning 2)  Lorazepam 1 Mg Tabs (Lorazepam) .... Take 1/2-1 Tablet By Mouth Three Times A Day 3)  Omeprazole 20 Mg Cpdr (Omeprazole) .... Take One Tablet Daily 4)  Budeprion Xl  300 Mg Tb24 (Bupropion Hcl) .... Take One Tablet Daily 5)  Fish Oil 1000 Mg Caps (Omega-3 Fatty Acids) .... Take 1 Capsule By Mouth Once A Day 6)  Crestor 20 Mg  Tabs (Rosuvastatin Calcium) .Marland Kitchen.. 1 Qd  Allergies: 1)  ! Codeine  Physical Exam  General:  well-nourished;alert,appropriate and cooperative throughout examination Heart:  regular rhythm, no murmur, no gallop, no rub, no JVD, and bradycardia.   Abdomen:  Bowel sounds positive,abdomen soft and non-tender without masses, organomegaly or hernias noted. Pulses:  R and L carotid,radial,dorsalis pedis and posterior tibial pulses are full and equal bilaterally Extremities:  No clubbing, cyanosis, edema, or deformity noted with normal full range of motion of all joints.  Neg SLR. Good nail health Neurologic:  alert & oriented X3, cranial nerves II-XII intact, sensation intact to light touch, heel / toe gait normal, and DTRs symmetrical and normal.   Skin:  Intact without suspicious lesions or rashes Psych:  memory intact for recent and remote and normally interactive.     Impression & Recommendations:  Problem # 1:  LOW BACK PAIN SYNDROME (ICD-724.2)  The following medications were removed from the medication list:    Cyclobenzaprine Hcl 5 Mg Tabs (Cyclobenzaprine hcl) .Marland Kitchen... 1-2 at bedtime as needed back pain His updated medication list for this problem includes:  Aspirin Ec 81 Mg Tbec (Aspirin) .Marland Kitchen... Take 1 tablet by mouth every morning  Orders: T-Lumbar Spine Comp w/Bend View [phone])  Problem # 2:  HYPERGLYCEMIA, FASTING (ICD-790.29)  Complete Medication List: 1)  Aspirin Ec 81 Mg Tbec (Aspirin) .... Take 1 tablet by mouth every morning 2)  Lorazepam 1 Mg Tabs (Lorazepam) .... Take 1/2-1 tablet by mouth three times a day 3)  Omeprazole 20 Mg Cpdr (Omeprazole) .... Take one tablet daily 4)  Budeprion Xl 300 Mg Tb24 (Bupropion hcl) .... Take one tablet daily 5)  Fish Oil 1000 Mg Caps (Omega-3 fatty acids) .... Take 1 capsule  by mouth once a day 6)  Crestor 20 Mg Tabs (Rosuvastatin calcium) .Marland Kitchen.. 1 qd  Other Orders: UA Dipstick w/o Micro (manual) (45409)  Patient Instructions: 1)  Keep Diary of back pain & possible triggers; Physical Therapy or Chiropractry if no better.   Orders Added: 1)  UA Dipstick w/o Micro (manual) [81002] 2)  Est. Patient Level IV [81191] 3)  T-Lumbar Spine Comp w/Bend View [72114TC]    Laboratory Results   Urine Tests    Routine Urinalysis   Color: yellow Appearance: Clear Glucose: negative   (Normal Range: Negative) Bilirubin: negative   (Normal Range: Negative) Ketone: negative   (Normal Range: Negative) Spec. Gravity: 1.010   (Normal Range: 1.003-1.035) Blood: negative   (Normal Range: Negative) pH: 6.0   (Normal Range: 5.0-8.0) Protein: negative   (Normal Range: Negative) Urobilinogen: 0.2   (Normal Range: 0-1) Nitrite: negative   (Normal Range: Negative) Leukocyte Esterace: negative   (Normal Range: Negative)

## 2010-08-09 NOTE — Progress Notes (Signed)
Summary: Rosita Fire pt to ED  Phone Note Call from Patient Call back at Home Phone [phone]   Caller: Spouse Summary of Call: patient wifeElease Hashimoto)  is stating that patient is vomitting and cant not stop. Patient wife stated is started about 5 am this morning. Patient also has a headache. No fever. Patient feels to bad to come in for an appt. Requesting a med from pharmacy. [facility] [address]. Patient wife said she has these symptons a week ago. Initial call taken by: Barb Merino,  August 30, 2009 8:30 AM  Follow-up for Phone Call        SPOKE WITH PT UNABLE TO EAT OR DRINK ANYTHING SINCE 5 THIS AM .  PT ADVISE ED FOR POSSIBLE DEHYDRATION. PT OK...................Marland KitchenFelecia Deloach CMA  August 30, 2009 8:51 AM   Additional Follow-up for Phone Call Additional follow up Details #1::        appropriate response Additional Follow-up by: Marga Melnick MD,  August 30, 2009 4:03 PM

## 2010-08-11 NOTE — Procedures (Signed)
Summary: Colonoscopy  Patient: [name] [name] Note: All result statuses are Final unless otherwise noted.  Tests: (1) Colonoscopy (COL)   COL Colonoscopy           DONE     [facility]     [address]     [place], [place]  [address]           COLONOSCOPY PROCEDURE REPORT           PATIENT:  [name], [name]  MR#:  [id number]     BIRTHDATE:  [birth year], 54 yrs. old  GENDER:  male     ENDOSCOPIST:  Wilhemina Bonito. Eda Keys, MD     REF. BY:  Surveillance Program Recall,     PROCEDURE DATE:  07/06/2010     PROCEDURE:  Surveillance Colonoscopy     ASA CLASS:  Class II     INDICATIONS:  history of polyps, surveillance and high-risk     screening ; index exam 07-2005 w/ small adenomatous appearring     polyp not retrieved     MEDICATIONS:   Fentanyl 100 mcg IV, Versed 12 mg IV, Benadryl 50     mg IV           DESCRIPTION OF PROCEDURE:   After the risks benefits and     alternatives of the procedure were thoroughly explained, informed     consent was obtained.  Digital rectal exam was performed and     revealed no abnormalities.   The LB 180AL [id number] endoscope was     introduced through the anus and advanced to the cecum, which was     identified by both the appendix and ileocecal valve, without     limitations.Time to cecum = 2:19 min.  The quality of the prep was     good, using MoviPrep.  The instrument was then slowly withdrawn     (time = 12:58 min) as the colon was fully examined.     <<PROCEDUREIMAGES>>           FINDINGS:  A normal appearing cecum, ileocecal valve, and     appendiceal orifice were identified. The ascending, hepatic     flexure, transverse, splenic flexure, descending, sigmoid colon,     and rectum appeared unremarkable.  No polyps or cancers were seen.     Retroflexed views in the rectum revealed internal hemorrhoids.     The scope was then withdrawn from the patient and the procedure     completed.           COMPLICATIONS:  None     ENDOSCOPIC  IMPRESSION:     1) Normal colon     2) No polyps or cancers     3) Small Internal hemorrhoids           RECOMMENDATIONS:     1) Continue current colorectal screening recommendations for     "routine risk" patients with a repeat colonoscopy in 10 years.           ______________________________     Wilhemina Bonito. Eda Keys, MD           CC:  Pecola Lawless, MD; The Patient           n.     eSIGNED:   Wilhemina Bonito. Eda Keys at 07/06/2010 02:05 PM           Dwan Bolt, [id number]  Note: An exclamation mark (!) indicates a result that was not  dispersed into the flowsheet. Document Creation Date: 07/06/2010 2:06 PM _______________________________________________________________________  (1) Order result status: Final Collection or observation date-time: 07/06/2010 13:58 Requested date-time:  Receipt date-time:  Reported date-time:  Referring Physician:   Ordering Physician: Fransico Setters [phone]) Specimen Source:  Source: Launa Grill Order Number: [id number] Lab site:   Appended Document: Colonoscopy    Clinical Lists Changes  Observations: Added new observation of COLONNXTDUE: 06/2020 (07/06/2010 14:36)

## 2010-08-11 NOTE — Miscellaneous (Signed)
Summary: Lec previsit  Clinical Lists Changes  Medications: Added new medication of MOVIPREP 100 GM  SOLR (PEG-KCL-NACL-NASULF-NA ASC-C) As per prep instructions. - Signed Rx of MOVIPREP 100 GM  SOLR (PEG-KCL-NACL-NASULF-NA ASC-C) As per prep instructions.;  #1 x 0;  Signed;  Entered by: Ulis Rias RN;  Authorized by: Hilarie Fredrickson MD;  Method used: Electronically to [id number] [address] [place], [place], [place]  [address], Ph: [phone], Fax: [phone] Observations: Added new observation of ALLERGY REV: Done (06/21/2010 7:59)    Prescriptions: MOVIPREP 100 GM  SOLR (PEG-KCL-NACL-NASULF-NA ASC-C) As per prep instructions.  #1 x 0   Entered by:   Ulis Rias RN   Authorized by:   Hilarie Fredrickson MD   Signed by:   Ulis Rias RN on 06/21/2010   Method used:   Electronically to        [id number] (retail)       [address]       [place], [place]  [address]       Ph: [phone]       Fax: [phone]   RxID:   [id number]

## 2010-08-11 NOTE — Progress Notes (Signed)
Summary: ? reaction to med  Phone Note Refill Request Call back at [phone]  [phone] wife  Message from:  wife  Refills Requested: Medication #1:  LIPITOR 20 MG TABS 1 once daily. Pt c/o muscle pain in leg and lower back that have increase since starting med. Pt notes that he has recently started a diet  along with exercising.  Pt was recently change to generic and muscle pain have still continued. Pls advise..........Marland KitchenFelecia Deloach CMA  August 04, 2010 12:48 PM    Follow-up for Phone Call        CPK , muscle enzyme needed (729.1) Follow-up by: Marga Melnick MD,  August 04, 2010 12:52 PM  Additional Follow-up for Phone Call Additional follow up Details #1::        Patient notifed and will go to [facility] tomorrow for test. Additional Follow-up by: Lucious Groves CMA,  August 04, 2010 2:22 PM

## 2010-08-11 NOTE — Letter (Signed)
Summary: [facility] Instructions  [facility]  [address] [place], [place] [address]   Phone: [phone]  Fax: [phone]       [name] [name]    1956/05/03    MRN: [id number]        Procedure Day Dorna Bloom:  Wednesday 07/06/2010     Arrival Time:  12:30 pm      Procedure Time:  1:30 pm     Location of Procedure:                    _x _  [facility] (4th Floor)                        PREPARATION FOR COLONOSCOPY WITH MOVIPREP   Starting 5 days prior to your procedure Friday 12/23 do not eat nuts, seeds, popcorn, corn, beans, peas,  salads, or any raw vegetables.  Do not take any fiber supplements (e.g. Metamucil, Citrucel, and Benefiber).  THE DAY BEFORE YOUR PROCEDURE         DATE: Tuesday 12/27  1.  Drink clear liquids the entire day-NO SOLID FOOD  2.  Do not drink anything colored red or purple.  Avoid juices with pulp.  No orange juice.  3.  Drink at least 64 oz. (8 glasses) of fluid/clear liquids during the day to prevent dehydration and help the prep work efficiently.  CLEAR LIQUIDS INCLUDE: Water Jello Ice Popsicles Tea (sugar ok, no milk/cream) Powdered fruit flavored drinks Coffee (sugar ok, no milk/cream) Gatorade Juice: apple, white grape, white cranberry  Lemonade Clear bullion, consomm, broth Carbonated beverages (any kind) Strained chicken noodle soup Hard Candy                             4.  In the morning, mix first dose of MoviPrep solution:    Empty 1 Pouch A and 1 Pouch B into the disposable container    Add lukewarm drinking water to the top line of the container. Mix to dissolve    Refrigerate (mixed solution should be used within 24 hrs)  5.  Begin drinking the prep at 5:00 p.m. The MoviPrep container is divided by 4 marks.   Every 15 minutes drink the solution down to the next mark (approximately 8 oz) until the full liter is complete.   6.  Follow completed prep with 16 oz of clear liquid of your choice  (Nothing red or purple).  Continue to drink clear liquids until bedtime.  7.  Before going to bed, mix second dose of MoviPrep solution:    Empty 1 Pouch A and 1 Pouch B into the disposable container    Add lukewarm drinking water to the top line of the container. Mix to dissolve    Refrigerate  THE DAY OF YOUR PROCEDURE      DATE: Wednesday 12/28  Beginning at 8:30 a.m. (5 hours before procedure):         1. Every 15 minutes, drink the solution down to the next mark (approx 8 oz) until the full liter is complete.  2. Follow completed prep with 16 oz. of clear liquid of your choice.    3. You may drink clear liquids until 11:30 am (2 HOURS BEFORE PROCEDURE).   MEDICATION INSTRUCTIONS  Unless otherwise instructed, you should take regular prescription medications with a small sip of water   as early as possible the  morning of your procedure.         OTHER INSTRUCTIONS  You will need a responsible adult at least 56 years of age to accompany you and drive you home.   This person must remain in the waiting room during your procedure.  Wear loose fitting clothing that is easily removed.  Leave jewelry and other valuables at home.  However, you may wish to bring a book to read or  an iPod/MP3 player to listen to music as you wait for your procedure to start.  Remove all body piercing jewelry and leave at home.  Total time from sign-in until discharge is approximately 2-3 hours.  You should go home directly after your procedure and rest.  You can resume normal activities the  day after your procedure.  The day of your procedure you should not:   Drive   Make legal decisions   Operate machinery   Drink alcohol   Return to work  You will receive specific instructions about eating, activities and medications before you leave.    The above instructions have been reviewed and explained to me by   Ulis Rias RN  June 21, 2010 8:13 AM    I fully understand  and can verbalize these instructions _____________________________ Date _________

## 2010-08-11 NOTE — Assessment & Plan Note (Signed)
Summary: CPX    Vital Signs:  Patient profile:   55 year old male Height:      70.5 inches Weight:      204 pounds BMI:     28.96 Temp:     98.6 degrees F oral Pulse rate:   72 / minute Resp:     14 per minute BP sitting:   120 / 82  (left arm) Cuff size:   large  Vitals Entered By: Shonna Chock CMA (June 23, 2010 1:20 PM) CC: CPX and discuss labs , Heartburn, Lipid Management   Primary Care Provider:  Oren Bracket  CC:  CPX and discuss labs , Heartburn, and Lipid Management.  History of Present Illness:       [name] [name] is here for a physical; he is asymptomatic.       GERD : The patient reports  weight gain of 5-7#, but denies acid reflux, sour taste in mouth, epigastric pain, and trouble swallowing.  The patient denies the following alarm features: melena, dysphagia, hematemesis, and vomiting.  Prior evaluation has included EGD  which was negative.  The patient has found the following treatments to be effective: a PPI.  Symptoms recur if off meds > 3 days.       Hyperlipidemia:   The patient denies muscle aches, GI upset, abdominal pain, flushing, itching, constipation, diarrhea, and fatigue.  The patient denies the following symptoms: chest pain/pressure, exercise intolerance, dypsnea, palpitations, syncope, and pedal edema.  Compliance with medications (by patient report) has been near 100%.  Dietary compliance has been fair.  The patient reports exercising 3 X per week as running & weights.  Adjunctive measures currently used by the patient include fish oil supplements.    Lipid Management History:      Positive NCEP/ATP III risk factors include male age 21 years old or older.  Negative NCEP/ATP III risk factors include non-diabetic, no family history for ischemic heart disease, non-tobacco-user status, non-hypertensive, no ASHD (atherosclerotic heart disease), no prior stroke/TIA, no peripheral vascular disease, and no history of aortic aneurysm.     Allergies: 1)  !  Codeine  Past History:  Past Medical History: Hyperlipidemia: NMR Lipoprofile 2007: LDL 149(2024/1209), HDL 61, TG 138. LDL goal = < 100. Framingham Study LDL goal = < 160. Dyspepsia/ GERD, Ranitidine X 3 months w/o control Nephrolithiasis,PMH  of with microscopic hematuria Depression, PMH of  RBBB Atypical chest pain:  normal myovue 09/06/2007 GERD Colonic polyps,PMH  of, Dr Yancey Flemings  Past Surgical History: Colon polypectomy 07/2005;  Dr Marina Goodell ( scheduled 07/06/2010); EGD : negative Tonsillectomy Lithotripsy X1; Surgical retrieval of stone, Dr Karalee Height  Family History: Father: DM,CAD, pericarditis, MI X 2 in  51s, CABG Mother: HTN,lipids, skin cancer , Alsheimer's,breast cancer Siblings: none; MGM: MI in 70s;;MGF:  MI in 57s  Social History: Married Occupation: Psychologist, occupational Patient has never smoked.  Alcohol Use - yes-socially: 2 glasses of wine / night Daily Caffeine Use 2 drinks per day 4 kids:   daughter , Vet .  Regular exercise-yes  Review of Systems  The patient denies anorexia, fever, vision loss, decreased hearing, hoarseness, prolonged cough, hemoptysis, hematuria, suspicious skin lesions, unusual weight change, abnormal bleeding, enlarged lymph nodes, and angioedema.    Physical Exam  General:  well-nourished;alert,appropriate and cooperative throughout examination Head:  Normocephalic and atraumatic without obvious abnormalities. No apparent alopecia  Eyes:  No corneal or conjunctival inflammation noted. Perrla. Funduscopic exam benign, without hemorrhages, exudates or papilledema.  Ears:  External ear exam shows no significant lesions or deformities.  Otoscopic examination reveals clear canals, tympanic membranes are intact bilaterally without bulging, retraction, inflammation or discharge. Hearing is grossly normal bilaterally. Nose:  External nasal examination shows no deformity or inflammation. Nasal mucosa are pink and moist without lesions or  exudates. Mouth:  Oral mucosa and oropharynx without lesions or exudates.  Teeth in good repair. Neck:  No deformities, masses, or tenderness noted. Lungs:  Normal respiratory effort, chest expands symmetrically. Lungs are clear to auscultation, no crackles or wheezes. Heart:  Normal rate and regular rhythm. S1 and S2 normal without gallop, murmur, click, rub .S4  Abdomen:  Bowel sounds positive,abdomen soft and non-tender without masses, organomegaly or hernias noted. Rectal:  Colonoscopy 07/06/2010 Genitalia:  Testes bilaterally descended without nodularity, tenderness . No scrotal masses or lesions. No penis lesions or urethral discharge. L varicocele with minor granuloma.   Prostate:  PSA 0.67 Msk:  No deformity or scoliosis noted of thoracic or lumbar spine.   Pulses:  R and L carotid,radial,dorsalis pedis and posterior tibial pulses are full and equal bilaterally Extremities:  No clubbing, cyanosis, edema, or deformity noted with normal full range of motion of all joints.   Neurologic:  alert & oriented X3 and DTRs symmetrical and normal.   Skin:  Intact without suspicious lesions or rashes Cervical Nodes:  No lymphadenopathy noted Axillary Nodes:  No palpable lymphadenopathy Psych:  memory intact for recent and remote, normally interactive, and good eye contact.     Impression & Recommendations:  Problem # 1:  ROUTINE GENERAL MEDICAL EXAM@HEALTH  CARE FACL (ICD-V70.0)  Orders: EKG w/ Interpretation (93000)  Problem # 2:  GERD (ICD-530.81) controlled His updated medication list for this problem includes:    Omeprazole 20 Mg Cpdr (Omeprazole) .Marland Kitchen... Take one tablet daily  Problem # 3:  HYPERLIPIDEMIA (ICD-272.4)  The following medications were removed from the medication list:    Crestor 20 Mg Tabs (Rosuvastatin calcium) .Marland Kitchen... 1 qd His updated medication list for this problem includes:    Lipitor 20 Mg Tabs (Atorvastatin calcium) .Marland Kitchen... 1 once daily  Complete Medication  List: 1)  Aspirin Ec 81 Mg Tbec (Aspirin) .... Take 1 tablet by mouth every morning 2)  Lorazepam 1 Mg Tabs (Lorazepam) .... Take 1/2-1 tablet by mouth three times a day 3)  Omeprazole 20 Mg Cpdr (Omeprazole) .... Take one tablet daily 4)  Budeprion Xl 300 Mg Tb24 (Bupropion hcl) .... Take one tablet daily 5)  Fish Oil 1000 Mg Caps (Omega-3 fatty acids) .... Take 1 capsule by mouth once a day 6)  Moviprep 100 Gm Solr (Peg-kcl-nacl-nasulf-na asc-c) .... As per prep instructions. 7)  Multivitamins Tabs (Multiple vitamin) .Marland Kitchen.. 1 by mouth once daily 8)  Lipitor 20 Mg Tabs (Atorvastatin calcium) .Marland Kitchen.. 1 once daily  Other Orders: Influenza Vaccine NON MCR (04540)  Lipid Assessment/Plan:      Based on NCEP/ATP III, the patient's risk factor category is "2 or more risk factors and a calculated 10 year CAD risk of > 20%".  The patient's lipid goals are as follows: Total cholesterol goal is 200; LDL cholesterol goal is 100; HDL cholesterol goal is 40; Triglyceride goal is 150.  His LDL cholesterol goal has been met.    Patient Instructions: 1)  Take an  81 mg COATED Aspirin every day with b'fast. 2)  Please schedule a follow-up  fasting Lab appointment after 10 weeks of Lipitor for: 3)  Hepatic Panel , ICD-9:995.20 4)  Lipid Panel , ICD-9:272.4, V17.3. Prescriptions: LIPITOR 20 MG TABS (ATORVASTATIN CALCIUM) 1 once daily  #90 x 3   Entered and Authorized by:   Marga Melnick MD   Signed by:   Marga Melnick MD on 06/23/2010   Method used:   Print then Give to Patient   RxID:   [id number] OMEPRAZOLE 20 MG CPDR (OMEPRAZOLE) take one tablet daily  #90 x 3   Entered and Authorized by:   Marga Melnick MD   Signed by:   Marga Melnick MD on 06/23/2010   Method used:   Print then Give to Patient   RxID:   [id number]    Orders Added: 1)  Est. Patient 40-64 years [99396] 2)  EKG w/ Interpretation [93000] 3)  Influenza Vaccine NON MCR [00028]   Immunizations  Administered:  Influenza Vaccine # 1:    Vaccine Type: Fluvax Non-MCR    Site: right deltoid    Mfr: Sanofi Pasteur    Dose: 0.5 ml    Route: IM    Given by: Shonna Chock CMA    Exp. Date: 01/07/2012    Lot #: [id number]    VIS given: 02/01/10 version given June 23, 2010.   Immunizations Administered:  Influenza Vaccine # 1:    Vaccine Type: Fluvax Non-MCR    Site: right deltoid    Mfr: Sanofi Pasteur    Dose: 0.5 ml    Route: IM    Given by: Shonna Chock CMA    Exp. Date: 01/07/2012    Lot #: [id number]    VIS given: 02/01/10 version given June 23, 2010.

## 2010-08-11 NOTE — Progress Notes (Signed)
Summary: Prep   Phone Note Call from Patient Call back at Home Phone [phone]   Caller: patients wife Call For: Dr. Marina Goodell Reason for Call: Talk to Nurse Summary of Call: Pt is still not able to keep liquids down, wife is wanting to know what to do next Initial call taken by: [place] Johnson,  July 06, 2010 10:41 AM  Follow-up for Phone Call        pt. arrived for procedure. when nurse spoke with pt. earlier he was informed that a enema can be given. Dr. Marina Goodell made aware that pt. unable to keep part of prep down and v.o. given to give the enema. Follow-up by: Greer Ee RN,  July 06, 2010 1:33 PM

## 2010-08-11 NOTE — Progress Notes (Signed)
Summary: Triage   Phone Note Call from Patient Call back at Home Phone [phone]   Caller: Patient Call For: Dr. Marina Goodell Reason for Call: Talk to Nurse Summary of Call: Finished the Moviprep last night, was able to drink 2 glasses this morning and began to vomit. Initial call taken by: Karna Christmas,  July 06, 2010 9:48 AM  Follow-up for Phone Call        After taking last dose of prep this am. Vomitted back up.  Stools are brown liquid.  Incouraged to drink p;enty of clear liquids and come in as scheduled. Dr Marina Goodell aware.     Follow-up by: Laverna Peace RN,  July 06, 2010 10:05 AM

## 2010-11-22 NOTE — Consult Note (Signed)
NAME:  [name] [name], [name] [name] NO.:  [id number]   MEDICAL RECORD NO.:  [id number]          PATIENT TYPE:  EMS   LOCATION:  ED                           FACILITY:  [facility]   PHYSICIAN:  Sigmund I. Patsi Sears, M.D.DATE OF BIRTH:  22-Nov-1955   DATE OF CONSULTATION:  04/04/2008  DATE OF DISCHARGE:                                 CONSULTATION   SUBJECTIVE:  This is a 55 year old married male, woke with acute left  flank pain with pain level of 10/10 this morning associated with nausea,  vomiting.  No gross hematuria.  The patient is post lithotripsy in June  2009, with recovery of stone with evaluation showing calcium oxalate  crystals.  The patient has now been seen in the emergency room, given IV  Toradol, Dilaudid x2, and Zofran.  He continues to have pain, however.  CT shows a 4-mm left ureterovesical junction stone with mild  hydronephrosis.  This stone was visible on KUB.  He has bilateral  nephrolithiasis.  His past history is significant for elevated  cholesterol, GERD, and depression.  Tobacco is none.  Alcohol is  occasionally.   FAMILY HISTORY:  Noncontributory.   SOCIAL HISTORY:  The patient is married and lives at home.   MEDICATIONS:  1. Wellbutrin 300 mg a day.  2. Prilosec 20 mg a day.  3. Crestor 20 mg per day.  4. Hydrocodone.  5. Lorazepam 1 mg.  6. Aspirin 81 mg a day.   PHYSICAL EXAMINATION:  GENERAL:  Well-developed, well-nourished male in  moderate distress.  VITAL SIGNS:  Blood pressure is 117/58, temperature is 97.8, pulse of  62, respiratory rate 18.  NECK:  Supple, nontender.  CHEST:  Clear to P&A.  ABDOMEN:  Soft, decreasing bowel sounds without organomegaly, without  masses.  GENITOURINARY:  Normal penis.  Normal urethra.  Normal glans.  Testicles  are descended bilaterally.  RECTAL:  Normal sphincter tone.  Prostate 3+, rubbery benign prostate.  No masses.  No blood.  EXTREMITIES:  No cyanosis.  No edema.   The laboratories show white  blood cell count 12,000, hemoglobin 13.3,  hematocrit 40.1.  Urinalysis is essentially negative.   IMPRESSION:  I have discussed the CT results with the patient and his  wife extensively and reviewed the CT result as well with Radiology.  I  presented the patient with multiple options including home medications  with pain medication, to follow up with Dr. Vonita Moss next week.  The  patient could theoretically have lithotripsy next week.  Because he has  been on aspirin and because he received Toradol in the emergency room,  he is not a candidate for lithotripsy on Monday.  In addition, the  patient could have a stent placed and then have lithotripsy on delayed  basis as well.  The patient is awake, alert, and desired to go ahead and  have operative intervention for extraction of the  stone today.  He will have general anesthesia and be allowed to be  discharged following surgery.  He may or may not have to have a double-J  stent placed.  The patient  understands this, and we will proceed to the  operating room this afternoon.      Sigmund I. Patsi Sears, M.D.  Electronically Signed     SIT/MEDQ  D:  04/04/2008  T:  04/05/2008  Job:  [id number]   cc:   Maretta Bees. Vonita Moss, M.D.  Fax: [phone]   Dr. Alwyn Ren

## 2010-11-22 NOTE — Op Note (Signed)
NAME:  LESS, WOOLSEY NO.:  [id number]   MEDICAL RECORD NO.:  [id number]          PATIENT TYPE:  EMS   LOCATION:  ED                           FACILITY:  [facility]   PHYSICIAN:  Sigmund I. Patsi Sears, M.D.DATE OF BIRTH:  03-07-1956   DATE OF PROCEDURE:  04/04/2008  DATE OF DISCHARGE:  04/04/2008                               OPERATIVE REPORT   PREOPERATIVE DIAGNOSIS:  Nonprogressive left lower ureteral calculus  with recurrent colic.   POSTOPERATIVE DIAGNOSIS:  Nonprogressive left lower ureteral calculus  with recurrent colic.   OPERATIONS:  Cystourethroscopy, left retrograde pyelogram with  interpretation, ureteroscopy, laser fractionation of stone, basket  extraction of laser fragments, left double-J catheter (6-French x 24  cm).   SURGEON:  Sigmund I. Patsi Sears, M.D.   ANESTHESIA:  General LMA.   PREPARATION:  After appropriate preanesthesia, the patient is brought to  the operating room, placed on the operating room in dorsal supine  position where general LMA anesthesia was induced.  He was then replaced  in dorsal lithotomy position where the pubis was prepped with Betadine  solution and draped in usual fashion.   REVIEW OF HISTORY:  This 55 year old male awoke with acute left flank  pain, measuring 10/10, with nausea, vomiting this morning.  He has a  history of lithotripsy of calcium oxalate stones in June 2009.  He was  treated with IV Toradol, pain medication and nausea medication, but  continues to have renal colic.  He was found to have a 4 mm left  ureterovesical junction stone with hydronephrosis.  He is now for basket  extraction of stone.   PROCEDURE:  Cystourethroscopy revealed a normal-appearing bladder.  Ureteral orifices were very small but in normal position.  The left  ureteral orifice was catheterized, retrograde pyelogram revealed a 4-mm  stone at the ureterovesical junction.  There was mild hydronephrosis  above the stone.  The  guidewire was passed into the kidney, and  ureteroscopy was accomplished, which showed a large amount of edema  around the stone, causing constriction of the ureter just distal to the  stone at the left ureterovesical junction.  The scope was used to dilate  the strictured area, the stone was identified.  Using a laser fiber on  low power, the stone was powdered, and removed with a basket.  Because  of manipulation in ureter, it was elected to leave a double-J catheter.  A 6-French x 24-cm catheter was placed in the renal pelvis and coiled in  the bladder as well.  The patient tolerated the procedure well.  He was  given IV Toradol prior to awakening.  The patient was then awakened, and  taken to the recovery room in good condition.      Sigmund I. Patsi Sears, M.D.  Electronically Signed     SIT/MEDQ  D:  04/04/2008  T:  04/06/2008  Job:  [id number]

## 2010-11-22 NOTE — Assessment & Plan Note (Signed)
[facility]                         GASTROENTEROLOGY OFFICE NOTE   [name] [name], [name] [name]                     MRN:          [id number]  DATE:09/11/2007                            DOB:          05/09/1956    REFERRING PHYSICIAN:  Titus Dubin. Alwyn Ren, MD,FACP,FCCP.   PROBLEM:  Heartburn.   HISTORY:  [name] [name] is a pleasant 55 year old white male known to Dr.  Marina Goodell from screening colonoscopy done in January, 2007.  This was  positive for colon polyps and internal hemorrhoids.  There is no path on  the polyp, as it was not retrieved.   This time, [name] [name] has been having problems with heartburn and  indigestion over the past six months to one year.  He had initially  tried over-the-counter acid blockers, which were helpful, but every time  that he stopped them, his symptoms would recur.  Eventually, he was seen  by Dr. Alwyn Ren and says had a couple of courses of what he believes was  generic Prilosec.  Again, medication helped but after each course, his  symptoms would recur within 2-3 weeks.  He has no complaints of  dysphagia or odynophagia.  He had a particularly bad episode of burning  mid chest pain which occurred this past weekend while on the treadmill  at the gym.  He was seen by Dr. Alwyn Ren and then referred for stress  test, which he had this last Friday.  He is to see Dr. Eden Emms tomorrow  to review that.  He says he has not had any further significant episodes  of chest discomfort but has had a little bit of tightness today and also  feels that he has been a little bit more short of breath over the past  few days than usual.  He has not resumed his exercise regimen.  He has  also been placed back on Prilosec over the past several days and says  that he is not having any current heartburn or indigestion.  He denies  any problems with nausea.  His appetite has been fine.  His weight has  been stable.  His bowel habits have been regular for him,  without melena  or hematochezia.   Patient has not been on any regular NSAIDs.  He does take baby aspirin 1  daily.   CURRENT MEDICATIONS:  Wellbutrin, Lorazepam, Prilosec 20 mg daily,  aspirin 81 mg daily, fish oil supplement.  Patient was not certain of  the dosages of his Wellbutrin and Lorazepam.   Laboratory studies done on September 03, 2007 showed a white count of  5.7, hemoglobin 14.2, hematocrit 31.8, platelets 220.   PAST MEDICAL HISTORY:  Positive for hyperlipidemia.  Anxiety x4 years.  Depression x8 years, on medication.  He has not had any prior surgeries.   FAMILY HISTORY:  Pertinent for heart disease in his father.  Diabetes in  his father, grandmother, and a cousin, and breast cancer in his mother.  There is no family history of colon cancer or polyps that he is aware  of.   SOCIAL HISTORY:  Patient is married.  He has  four children.  He is  employed in [employer].  He is a nonsmoker.  He drinks at least one glass  of wine daily.   REVIEW OF SYSTEMS:  Positive for intermittent cough.  No sputum  production.  He has had some minimal problems with fatigue and shortness  of breath, as mentioned above.  Some stress urinary incontinence and  intermittent leg cramps.  Otherwise, as outlined in the HPI.   PHYSICAL EXAMINATION:  A well-developed white male in no acute distress.  Height is 5 feet 11.  Weight is 191.  Blood pressure 106/70, pulse 60.  HEENT:  Normocephalic and atraumatic.  EOMI.  PERRLA.  Sclerae are  anicteric.  NECK:  Supple.  CARDIOVASCULAR:  Regular rate and rhythm with S1 and S2.  No murmurs,  rubs or gallops.  PULMONARY:  Clear to A&P.  ABDOMEN:  Soft and nontender.  There was no mass or hepatosplenomegaly.  Bowel sounds were active.  RECTAL:  Not done today.   IMPRESSION:  70. A 55 year old male with chronic gastroesophageal reflux disease,      rule out Barrett's.  2. Chest pain, rule out angina versus gastroesophageal reflux disease       related symptoms.  3. Anxiety.   PLAN:  1. Patient has a follow-up appointment with Dr. Eden Emms later this      week.  As long as he has clearance from a cardiac standpoint, will      plan to proceed with upper endoscopy with Dr. Marina Goodell next week.  2. Continue Prilosec 20 mg p.o. q.a.m.  3. Discussed the antireflux regimen, and he was given educational      materials on chronic GERD and antireflux diet.      Mike Gip, PA-C  Electronically Signed      Barbette Hair. Arlyce Dice, MD,FACG  Electronically Signed   AE/MedQ  DD: 09/11/2007  DT: 09/11/2007  Job #: [id number]   cc:   Titus Dubin. Alwyn Ren, MD,FACP,FCCP  Wilhemina Bonito. Marina Goodell, MD

## 2010-11-22 NOTE — Assessment & Plan Note (Signed)
[name] [name]                            CARDIOLOGY OFFICE NOTE   [name] [name] [name] [name]                     MRN:          [id number]  DATE:10/08/2007                            DOB:          September 24, 1955    [name] [name] returns today for follow up.  I have seen him for  hypercholesterolemia and chest pain.   He had a normal Myoview but had persistent symptoms.  He had an EGD on  September 17, 2007 by Dr. Marina Goodell.  He does have GERD.  His symptoms seemed to  have improved on Omeprazole.   I had a long discussion with [name] [name].  Since his Myoview was normal and  his symptoms were improving on Omeprazole, I told him I would not  proceed further at this time with invasive cath or CT.   He seems comfortable with this plan.   The patient has been otherwise compliant with his medications.  He had a  2-D echocardiogram which was essentially normal with some mild bowing of  the mitral valve and trivial MR.  I went over this with him, as well.   REVIEW OF SYSTEMS:  Otherwise negative.   CURRENT MEDICATIONS:  1. Wellbutrin.  2. Lorazepam.  3. Prilosec 20 mg  4. A baby aspirin.  5. Fish oil.  6. Vytorin 10/20.   PHYSICAL EXAMINATION:  GENERAL:  A healthy-appearing middle-aged white  male in no distress.  VITAL SIGNS:  Blood pressure is 115/70, pulse 70 and regular, weight  193, afebrile, respiratory rate 14.  HEENT:  Unremarkable.  NECK:  Carotids are normal without bruit.  No lymphadenopathy,  thyromegaly or JVP elevation.  LUNGS:  Clear with good diaphragmatic motion.  No wheezing.  HEART:  S1-S2, normal heart sounds.  PMI normal.  ABDOMEN:  Benign.  Bowel sounds positive.  No AAA, no tenderness, no  epigastric pain, no hepatosplenomegaly, no hepatojugular reflux.  EXTREMITIES:  Pulses intact, no edema.  NEUROLOGIC:  Nonfocal.  SKIN:  Warm and dry.  MUSCULOSKELETAL:  No muscular weakness.   Myoview, 2-D echocardiogram and upper EGD reviewed.    IMPRESSION:  1. Somewhat atypical chest pain, nonischemic Myoview.  EGD showing      gastroesophageal reflux disease.  Continue baby aspirin and  risk      factor modification.  2. Gastroesophageal reflux disease.  Continue Prilosec or H2 blocker      per follow-up per Dr. Marina Goodell.  Avoid spicy food and late-night      meals.  3. Hypercholesterolemia.  Continue Vytorin.  Lipid and liver profile      in 3 months.  4. Anxiety.  Continue p.r.n. lorazepam and Wellbutrin.   FOLLOWUP:  Follow up with primary care M.D.  I will see him back in 3  months to reassess his symptoms.  He knows to call me if he were to have  any more chest pain that is more typical of angina and not responsive to  his H2 blocker.     [name]. Eden Emms, MD, [name]  Electronically Signed    PCN/MedQ  DD: 10/08/2007  DT: 10/08/2007  Job #:  [id number] 

## 2010-11-22 NOTE — Assessment & Plan Note (Signed)
[facility]                                 ON-CALL NOTE   [name] [name], [name] [name]                     MRN:          [id number]  DATE:09/28/2007                            DOB:          May 18, 1956    This is a patient of Dr. Alwyn Ren.  Call was at 1:00 in the morning on  March 21.   Alexia Freestone the wife called [phone].  The patient states that this evening as  a result of watching TV, he would see double objects on the TV at a  distance view; however, up close, he does not see double.  Wife states  that they have had a very long day, went to a funeral, had to drive a  long period of time and are tired, but there is no headache, dizziness,  associated symptoms and no underlying diabetes, hypertension, heart  disease.  He apparently has no other associated symptoms and does not  see double up close.   Recommended she observe for these symptoms and if progressive to go to  the emergency room, otherwise, to see Dr. Alwyn Ren on Monday, or she can  call back.     Neta Mends. Panosh, MD  Electronically Signed    WKP/MedQ  DD: 09/28/2007  DT: 09/28/2007  Job #: [id number]

## 2010-11-22 NOTE — Assessment & Plan Note (Signed)
[facility]                            CARDIOLOGY OFFICE NOTE   [name] [name], [name] [name] [name] [name]                     MRN:          [id number]  DATE:09/12/2007                            DOB:          1956/02/27    REASON FOR VISIT:  [name] [name] is a 55 year old patient referred by Dr.  Alwyn Ren for chest pain.  The patient has been having pain over the last  few weeks.  It is atypical for angina.  It is epigastric and substernal.  It is relieved with over-the-counter H2 blockers.  When he stops his H2  blockers, the pain gets worse.  It can be aggravated by wine, which he  has recently started drinking more in the last 6 months.   The patient has been under a lot of stress.  He works for [employer] which  just got bought by [employer].  He has a daughter who is about to get  married.   He did have a particularly bad episode while skiing at [facility].  At  the end of a ski run, he felt diaphoretic and had substernal pain.  Dr.  Alwyn Ren ordered a stress test on him on September 06, 2007.  I reviewed  the study and the images times 10 minutes, the EF was normal at 53%, and  the Myoview images were totally normal with no ischemia or infarction.  I showed the patient a heart, model and went over the study with him.   He has not had any recurrent episodes in the last week.  He is back on  antacids and feels better.   He does have an EGD scheduled with Dr. Marina Goodell coming up.  When he has  these episodes, there is no dizziness. There are no palpitations.  He  does not have a history of presyncope PE or history of aortic disease.   REVIEW OF SYSTEMS:  Otherwise, negative.   MEDICATIONS:  1. Wellbutrin 300 a day.  2. Lorazepam 1 mg a day.  3. Prilosec 20 mg of baby aspirin.  4. Vytorin 10/20.   ALLERGIES:  HE DENIES ANY ALLERGIES.   FAMILY HISTORY:  Noncontributory.  The patient has indicated he works  for [employer].  He is happily married.  He has four children and  they are  all doing fairly well.  On of his daughters is about to get married.  The patient exercises on a regular basis at the health club and in  general can exercise without significant chest pain.  He does not smoke.  He does drink wine.   PAST MEDICAL HISTORY:  1. Hyperlipidemia.  2. Right bundle branch block which is new from 2006.  I went over this      with him as well and explained to him that this is likely benign.  3. Previous tonsillectomy.   PHYSICAL EXAMINATION:  GENERAL Remarkable for a healthy-appearing,  middle-aged white male in no distress.  VITAL SIGNS:  Blood pressure is equal to 120/76, pulse 80 and regular,  weight is 206, afebrile.  HEENT:  Unremarkable.  NECK:  Carotids normal without  bruit, no lymphadenopathy, thyromegaly or  JVP elevation.  LUNGS:  Clear with good diaphragmatic motion.  No wheezing.  HEART:  S1-S2 normal heart sounds PMI normal.  ABDOMEN:  Benign.  Bowel sounds positive.  No AAA, no tenderness, no  bruit, no hepatosplenomegaly, no hepatojugular reflux.  Distal pulse  intact, no edema.  NEUROLOGICAL:  Nonfocal.  SKIN:  Warm and dry.  No muscular weakness.   STUDIES:  EKG shows right bundle branch block.   IMPRESSION:  1. Epigastric chest pain, likely secondary to reflux and GERD.  Follow-      up EGD with Dr. Marina Goodell.  Continue Prilosec or consider addition of      Protonix prescription strength, cut down on wine intake.  2. Continued chest pain despite a normal Myoview.  Check 2-D      echocardiogram to rule out hypertrophic cardiomyopathy or other      structural heart disease.  3. Right bundle branch block, benign, needs no further workup.  4. Anxiety.  Continue p.r.n. Valium.  5. Hypercholesterolemia.  Continue Vytorin 10/20, lipid and liver      profile in 6 months.  6. I will see the patient back after his echo if he has continued      symptoms despite normal Myoview and there are no findings on EGD.      I may pursue further  workup such as a cardiac CT or invasive cath.     [name]. Eden Emms, MD, [facility]  Electronically Signed    PCN/MedQ  DD: 09/12/2007  DT: 09/12/2007  Job #: [id number]

## 2010-11-22 NOTE — Assessment & Plan Note (Signed)
[facility]                            CARDIOLOGY OFFICE NOTE   [name] [name], [name] [name] [name] [name]                     MRN:          [id number]  DATE:01/06/2008                            DOB:          30-Apr-1956    [name] [name] returns today for followup.  I have seen him for chest pain  and hypercholesterolemia.   His pain is improved on omeprazole.  He is active.  He is not having  exertional chest pain and we both agreed that his symptoms seem to be  more related to reflux.  He has seen Dr. Marina Goodell, tells me he has had an  EGD which showed GERD.   I told him that continue H2 blocker therapy was in order and for the  time being since his stress test and echo were normal, that we would not  proceed with CT or an invasive cath.  Risk factors include  hypercholesterolemia.  He is on fish oil and on Vytorin.  I reviewed lab  work that was done on September 06, 2007.   At that time, his triglycerides were 58 and his LDL was 68.   I told him that these levels were excellent.   REVIEW OF SYSTEMS:  Otherwise remarkable for no significant palpitations  or shortness of breath.  No lower extremity edema.   CURRENT MEDICATIONS:  1. Wellbutrin 300 a day.  2. Lorazepam 1 mg a day.  3. Prilosec 20 a day.  4. Baby aspirin a day.  5. Fish oil.  6. Vytorin 10/20.   PHYSICAL EXAMINATION:  GENERAL:  Remarkable for healthy-appearing middle-  aged white male in no distress.  VITAL SIGNS:  Blood pressure 127/77, pulse 74 and regular, weight 197,  respiratory rate 14, and afebrile.  HEENT:  Unremarkable.  NECK:  Carotids are normal without bruit.  No lymphadenopathy,  thyromegaly, or JVP elevation.  No bruit.  LUNGS:  Clear.  Good diaphragmatic motion.  No wheezing.  S1 and S2.  Normal heart sounds.  PMI normal.  ABDOMEN:  Benign.  Bowel sounds positive.  No AAA, no tenderness, no  bruit, no hepatosplenomegaly or hepatojugular reflux.  EXTREMITIES:  Distal pulses are  intact.  No edema.  NEUROLOGIC:  Nonfocal.  PSYCHIATRIC:  Affect appropriate.  SKIN:  Warm and dry.  MUSCULOSKELETAL:  No muscular weakness.   He has a history of a right bundle branch block from outside EKG on  February 2009.   We have not repeated it here.   IMPRESSION:  1. Atypical chest pain, likely related to reflux.  Continue to      observe.  Negative Myoview.  No indication for cath.  2. Gastroesophageal reflux disease with reflux.  Continue omeprazole.      Follow up with Dr. Marina Goodell.  3. Hyperlipidemia.  Continue fish oil and Vytorin.  LFTs in 6 months.  4. Right bundle branch block, likely benign.  Followup EKG in a year.      No evidence of high-grade heart block or syncope.   I will see the patient in 6 months.     [name]. Eden Emms,  MD, [facility]  Electronically Signed    PCN/MedQ  DD: 01/06/2008  DT: 01/06/2008  Job #: [id number]

## 2011-04-06 LAB — POCT I-STAT, CHEM 8
Calcium, Ion: 1.24
Chloride: 102
HCT: 41
Hemoglobin: 13.9
Potassium: 4

## 2011-04-06 LAB — URINE MICROSCOPIC-ADD ON

## 2011-04-06 LAB — URINALYSIS, ROUTINE W REFLEX MICROSCOPIC
Glucose, UA: NEGATIVE
Specific Gravity, Urine: 1.02

## 2011-04-10 LAB — CBC
HCT: 40.1
Platelets: 173
RDW: 12.8
WBC: 12 — ABNORMAL HIGH

## 2011-04-10 LAB — DIFFERENTIAL
Basophils Relative: 0
Lymphocytes Relative: 6 — ABNORMAL LOW
Monocytes Absolute: 0.8
Monocytes Relative: 6
Neutro Abs: 10.4 — ABNORMAL HIGH

## 2011-04-10 LAB — URINALYSIS, ROUTINE W REFLEX MICROSCOPIC
Bilirubin Urine: NEGATIVE
Ketones, ur: NEGATIVE
Nitrite: NEGATIVE
Specific Gravity, Urine: 1.026
Urobilinogen, UA: 0.2
pH: 7.5

## 2011-04-10 LAB — BASIC METABOLIC PANEL
BUN: 20
Calcium: 8.8
GFR calc non Af Amer: 54 — ABNORMAL LOW
Glucose, Bld: 196 — ABNORMAL HIGH
Potassium: 4.4

## 2011-06-22 ENCOUNTER — Other Ambulatory Visit: Payer: Self-pay | Admitting: Internal Medicine

## 2011-06-22 NOTE — Telephone Encounter (Signed)
Patient needs to schedule a CPX and fasting labs  

## 2011-08-03 ENCOUNTER — Other Ambulatory Visit: Payer: Self-pay | Admitting: Internal Medicine

## 2011-10-02 ENCOUNTER — Other Ambulatory Visit: Payer: Self-pay | Admitting: Internal Medicine

## 2011-10-16 ENCOUNTER — Ambulatory Visit (INDEPENDENT_AMBULATORY_CARE_PROVIDER_SITE_OTHER): Payer: BC Managed Care – PPO | Admitting: Internal Medicine

## 2011-10-16 ENCOUNTER — Encounter: Payer: Self-pay | Admitting: Internal Medicine

## 2011-10-16 VITALS — BP 118/70 | HR 80 | Temp 97.9°F | Resp 12 | Ht 70.5 in | Wt 198.6 lb

## 2011-10-16 DIAGNOSIS — Z87442 Personal history of urinary calculi: Secondary | ICD-10-CM

## 2011-10-16 DIAGNOSIS — I4510 Unspecified right bundle-branch block: Secondary | ICD-10-CM

## 2011-10-16 DIAGNOSIS — E785 Hyperlipidemia, unspecified: Secondary | ICD-10-CM

## 2011-10-16 DIAGNOSIS — Z23 Encounter for immunization: Secondary | ICD-10-CM

## 2011-10-16 DIAGNOSIS — R9431 Abnormal electrocardiogram [ECG] [EKG]: Secondary | ICD-10-CM

## 2011-10-16 DIAGNOSIS — Z0000 Encounter for general adult medical examination without abnormal findings: Secondary | ICD-10-CM

## 2011-10-16 LAB — HEPATIC FUNCTION PANEL
Bilirubin, Direct: 0.1 mg/dL (ref 0.0–0.3)
Total Bilirubin: 0.7 mg/dL (ref 0.3–1.2)

## 2011-10-16 LAB — CBC WITH DIFFERENTIAL/PLATELET
Eosinophils Absolute: 0.2 10*3/uL (ref 0.0–0.7)
MCHC: 33.2 g/dL (ref 30.0–36.0)
MCV: 91.5 fl (ref 78.0–100.0)
Monocytes Absolute: 0.8 10*3/uL (ref 0.1–1.0)
Neutrophils Relative %: 70.4 % (ref 43.0–77.0)
Platelets: 234 10*3/uL (ref 150.0–400.0)

## 2011-10-16 LAB — LIPID PANEL
HDL: 63.2 mg/dL (ref >39.00)
Total CHOL/HDL Ratio: 3
Triglycerides: 50 mg/dL (ref 0.0–149.0)

## 2011-10-16 LAB — BASIC METABOLIC PANEL
BUN: 22 mg/dL (ref 6–23)
CO2: 27 mEq/L (ref 19–32)
Chloride: 102 mEq/L (ref 96–112)
Creatinine, Ser: 1 mg/dL (ref 0.4–1.5)

## 2011-10-16 LAB — TSH: TSH: 0.99 u[IU]/mL (ref 0.35–5.50)

## 2011-10-16 MED ORDER — TETANUS-DIPHTH-ACELL PERTUSSIS 5-2.5-18.5 LF-MCG/0.5 IM SUSP: 0.5000 mL | Freq: Once | INTRAMUSCULAR | Status: DC

## 2011-10-16 NOTE — Progress Notes (Signed)
  Subjective:    Patient ID: [name] [name], male    DOB: 03/31/1956, 56 y.o.   MRN: [id number]  HPI [name] [name]  is here for a physical; he denies acute issues except for minor respiratory tract infection which is resolving.     Review of Systems Patient reports no  vision/ hearing changes,anorexia, weight change, fever ,adenopathy, persistant / recurrent hoarseness, swallowing issues, chest pain,palpitations, edema,persistant / recurrent cough, hemoptysis, dyspnea(rest, exertional, paroxysmal nocturnal), gastrointestinal  bleeding (melena, rectal bleeding), abdominal pain, excessive heart burn, GU symptoms( dysuria, hematuria, pyuria, voiding/incontinence  issues) syncope, focal weakness, memory loss,numbness & tingling, skin/hair/nail changes, active depression, anxiety, abnormal bruising/bleeding,or  musculoskeletal symptoms/signs.  He denies fever, frontal headaches, facial pain, or purulent secretions.     Objective:   Physical Exam Gen.: Healthy and well-nourished in appearance. Alert, appropriate and cooperative throughout exam. Head: Normocephalic without obvious abnormalities; head shaven. Minor L frontal osteoma Eyes: No corneal or conjunctival inflammation noted. Pupils equal round reactive to light and accommodation. Fundal exam is benign without hemorrhages, exudate, papilledema. Extraocular motion intact. Vision grossly normal. Ears: External  ear exam reveals no significant lesions or deformities. Canals clear .TMs normal. Hearing is grossly normal bilaterally. Nose: External nasal exam reveals no deformity or inflammation. Nasal mucosa are pink and moist. No lesions or exudates noted.   Mouth: Oral mucosa and oropharynx reveal no lesions or exudates. Teeth in good repair. Neck: No deformities, masses, or tenderness noted. Range of motion &  Thyroid normal Lungs: Normal respiratory effort; chest expands symmetrically. Lungs are clear to auscultation without rales, wheezes, or  increased work of breathing. Heart: Normal rate and rhythm. Normal S1 and S2. No gallop, click, or rub. No  murmur. Abdomen: Bowel sounds normal; abdomen soft and nontender. No masses, organomegaly or hernias noted. Genitalia/DRE: Minor varicocele in the left.Prostate is normal without enlargement, asymmetry, nodularity, or induration.  Musculoskeletal/extremities: No deformity or scoliosis noted of  the thoracic or lumbar spine. No clubbing, cyanosis, edema, or significant deformity noted.Flexion R 5th digit. Range of motion  normal .Tone & strength  normal.Joints normal. Nail health  good. Vascular: Carotid, radial artery, dorsalis pedis and  posterior tibial pulses are full and equal. No bruits present. Neurologic: Alert and oriented x3. Deep tendon reflexes symmetrical and normal.          Skin: Intact without suspicious lesions or rashes. Lymph: No cervical, axillary, or inguinal lymphadenopathy present. Psych: Mood and affect are normal. Normally interactive                                                                                         Assessment & Plan:  #1 comprehensive physical exam; no acute findings #2 see Problem List with Assessments & Recommendations Plan: see Orders

## 2011-10-16 NOTE — Patient Instructions (Signed)
Preventive Health Care: Exercise at least 30-45 minutes a day,  3-4 days a week.  Eat a low-fat diet with lots of fruits and vegetables, up to 7-9 servings per day.  Consume less than 40 grams of sugar per day from foods & drinks with High Fructose Corn Sugar as #1,2,3 or # 4 on label. Please try to go on My Chart within the next 24 hours to allow me to release the results directly to you.  

## 2011-10-16 NOTE — Assessment & Plan Note (Signed)
EKG is essentially unchanged compared to 06/23/10. Right bundle branch block persists. There may be very subtle decrease in T. voltage in leads 2, aVF, and the 3-6. I recommend Cardiology reevaluation.

## 2011-10-17 ENCOUNTER — Encounter: Payer: Self-pay | Admitting: Internal Medicine

## 2011-10-17 NOTE — Telephone Encounter (Signed)
Dr.Hopper please advise 

## 2011-10-18 ENCOUNTER — Telehealth: Payer: Self-pay

## 2011-10-18 NOTE — Telephone Encounter (Signed)
Left message to call office

## 2011-10-18 NOTE — Telephone Encounter (Signed)
Message was left on Triage Voicemail: Patient not feeling well and would like something called in   I called patient to get more details: Patient stated he has a head/chest cold, patient with yellow discharge. Patient denies fever. Patient is using Neti Pot and mucinex. Pharmacy [facility] ([address]) [facility]  Dr.Hopper please advise

## 2011-10-18 NOTE — Telephone Encounter (Signed)
Amoxicillin 500 mg 3 times a day #30.   Plain Mucinex for thick secretions ;force NON dairy fluids for next 48 hrs. Use a Neti pot daily as needed for sinus congestion

## 2011-10-19 ENCOUNTER — Encounter: Payer: Self-pay | Admitting: Internal Medicine

## 2011-10-23 ENCOUNTER — Ambulatory Visit (INDEPENDENT_AMBULATORY_CARE_PROVIDER_SITE_OTHER): Payer: BC Managed Care – PPO | Admitting: Physician Assistant

## 2011-10-23 ENCOUNTER — Encounter: Payer: Self-pay | Admitting: Physician Assistant

## 2011-10-23 VITALS — BP 112/72 | HR 74 | Ht 71.0 in | Wt 196.8 lb

## 2011-10-23 DIAGNOSIS — I4510 Unspecified right bundle-branch block: Secondary | ICD-10-CM

## 2011-10-23 DIAGNOSIS — E785 Hyperlipidemia, unspecified: Secondary | ICD-10-CM

## 2011-10-23 NOTE — Patient Instructions (Signed)
Your physician recommends that you schedule a follow-up appointment as needed with Dr. Nishan. 

## 2011-10-23 NOTE — Progress Notes (Signed)
HPI:  This is a 56 year old white male patient who was referred to [place] today by Dr. Alwyn Ren for questionable change in EKG. The patient has a history of a right bundle branch block. He has no prior cardiac history and denies all cardiac complaints. He saw Dr. Eden Emms years ago when he was initially was found to have a right bundle branch block and no further testing was done.  He is quite active and runs 2-3 miles 3 days a week and lifts weights. He does have hyperlipidemia that's treated. He denies diabetes mellitus or hypertension. He denies chest pain, palpitations, dyspnea, dyspnea on exertion, dizziness, or presyncope.  Allergies  Allergen Reactions  . Codeine     REACTION: nausea    Current Outpatient Prescriptions on File Prior to Visit  Medication Sig Dispense Refill  . aspirin 81 MG tablet Take 81 mg by mouth daily.      Marland Kitchen atorvastatin (LIPITOR) 20 MG tablet TAKE 1 TABLET ONCE DAILY.  PLEASE MAKE AN APPOINTMENT WITH YOUR DOCTOR  90 tablet  0  . buPROPion (WELLBUTRIN XL) 300 MG 24 hr tablet Take 300 mg by mouth daily.      Marland Kitchen LORazepam (ATIVAN) 1 MG tablet Take 1 mg by mouth. 1/2-1 by mouth three times daily as needed      . Multiple Vitamin (MULTIVITAMIN) tablet Take 1 tablet by mouth daily.      . Omega-3 Fatty Acids (FISH OIL) 1000 MG CAPS Take by mouth 2 (two) times daily.      Marland Kitchen omeprazole (PRILOSEC) 20 MG capsule TAKE 1 CAPSULE DAILY  90 capsule  0   Current Facility-Administered Medications on File Prior to Visit  Medication Dose Route Frequency Provider Last Rate Last Dose  . TDaP (BOOSTRIX) injection 0.5 mL  0.5 mL Intramuscular Once Pecola Lawless, MD        Past Medical History  Diagnosis Date  . Hyperlipidemia   . GERD (gastroesophageal reflux disease)   . Chest pain 2009    negative nuclear stress test    Past Surgical History  Procedure Date  . Lithotripsy      X 1  . Renal calculus     Cystoscopy , Dr Marcello Fennel  . Tonsillectomy and adenoidectomy   .  Colonoscopy with polypectomy 2007    Dr Marina Goodell , negative 06/2010  . Upper gastrointestinal endoscopy     negative    Family History  Problem Relation Age of Onset  . Hypertension Mother   . Cancer Mother     breast & skin  . Heart disease Father     MI in 63s, CBAG  . Diabetes Father   . Heart disease Maternal Grandmother     MI in 61s  . Heart disease Maternal Grandfather     Mi in 76s  . Dementia Mother     History   Social History  . Marital Status: Married    Spouse Name: N/A    Number of Children: N/A  . Years of Education: N/A   Occupational History  . Not on file.   Social History Main Topics  . Smoking status: Never Smoker   . Smokeless tobacco: Not on file  . Alcohol Use: 8.4 oz/week    14 Glasses of wine per week  . Drug Use: No  . Sexually Active: Not on file   Other Topics Concern  . Not on file   Social History Narrative  . No narrative on file  ROS: see history of present illness otherwise negative   PHYSICAL EXAM: Well-nournished, in no acute distress. Neck: No JVD, HJR, Bruit, or thyroid enlargement Lungs: No tachypnea, clear without wheezing, rales, or rhonchi Cardiovascular: RRR, PMI not displaced, heart sounds normal, no murmurs, gallops, bruit, thrill, or heave. Abdomen: BS normal. Soft without organomegaly, masses, lesions or tenderness. Extremities: without cyanosis, clubbing or edema. Good distal pulses bilateral SKin: Warm, no lesions or rashes  Musculoskeletal: No deformities Neuro: no focal signs  BP 112/72  Pulse 74  Ht 5\' 11"  (1.803 m)  Wt 196 lb 12.8 oz (89.268 kg)  BMI 27.45 kg/m2   EKG:EKG from 10/16/11 was reviewed showed normal sinus rhythm with right bundle branch block and PACs this is unchanged from EKG on 06/23/10

## 2011-10-23 NOTE — Assessment & Plan Note (Signed)
treated

## 2011-10-23 NOTE — Assessment & Plan Note (Addendum)
Stable without change. EKG's reviewed with Dr. Patty Sermons who agrees.

## 2011-10-24 NOTE — Progress Notes (Signed)
[name] [name], I could not pull up the 2011 Centricity EKG. I compared the 10/16/11 EKG to 09/03/07. Comparing these 2 EKGs there have been significant loss of T. voltage particularly in the lateral V. Leads but also in II & aVF.

## 2011-10-25 MED ORDER — AMOXICILLIN 500 MG PO TABS: 500.0000 mg | ORAL_TABLET | Freq: Three times a day (TID) | ORAL | Status: AC

## 2011-10-25 NOTE — Telephone Encounter (Signed)
Left message on voicemail informing patient rx sent in and if no better after taking ABX he will need a follow-up appointment

## 2011-11-13 ENCOUNTER — Telehealth: Payer: Self-pay | Admitting: Internal Medicine

## 2011-11-13 MED ORDER — OMEPRAZOLE 20 MG PO CPDR: DELAYED_RELEASE_CAPSULE | ORAL | Status: DC

## 2011-11-13 NOTE — Telephone Encounter (Signed)
RX sent

## 2011-11-13 NOTE — Telephone Encounter (Signed)
Refill: Omeprazole cap 20mg . Take 1 capsule daily.

## 2011-12-19 ENCOUNTER — Telehealth: Payer: Self-pay | Admitting: Internal Medicine

## 2011-12-19 NOTE — Telephone Encounter (Signed)
Pt called triage line stating that it is recommended by his daughter's doctor (who is expecting a baby next month) that he get a tdap. Pt would like to know if he has had one, and if not, when he can come get one and how much it will cost. Call back # [phone]

## 2011-12-19 NOTE — Telephone Encounter (Signed)
Discussed with pt

## 2011-12-22 ENCOUNTER — Encounter: Payer: Self-pay | Admitting: Internal Medicine

## 2011-12-28 ENCOUNTER — Telehealth: Payer: Self-pay | Admitting: *Deleted

## 2011-12-28 NOTE — Telephone Encounter (Signed)
3 packs of 20 mg samples

## 2011-12-28 NOTE — Telephone Encounter (Signed)
Dr.Hopper please advise what dose of samples to place at the front for patient

## 2011-12-28 NOTE — Telephone Encounter (Signed)
Pt left vm on triage line to advise that he has been "conversating" with MD Alwyn Ren through My Chart, (no messages in My Chart noted) pt stated that MD Alwyn Ren told him to come in the office today or tomorrow to pick up samples for Crestor 20mg  that will last him for 2 weeks and to also schedule some labs to be drawn when the pt comes back from vacation and pt wanted to let MD Alwyn Ren know he will not be in the office today but come in tomorrow and wanted to eliminate any confusion at that time please advise

## 2011-12-29 NOTE — Telephone Encounter (Signed)
Samples placed at the front, patient aware

## 2012-01-12 ENCOUNTER — Other Ambulatory Visit (INDEPENDENT_AMBULATORY_CARE_PROVIDER_SITE_OTHER): Payer: BC Managed Care – PPO

## 2012-01-12 DIAGNOSIS — E785 Hyperlipidemia, unspecified: Secondary | ICD-10-CM

## 2012-01-12 DIAGNOSIS — T887XXA Unspecified adverse effect of drug or medicament, initial encounter: Secondary | ICD-10-CM

## 2012-01-12 LAB — LIPID PANEL
Cholesterol: 178 mg/dL (ref 0–200)
LDL Cholesterol: 97 mg/dL (ref 0–99)
VLDL: 18.2 mg/dL (ref 0.0–40.0)

## 2012-01-12 NOTE — Progress Notes (Signed)
Labs only

## 2012-01-18 ENCOUNTER — Encounter: Payer: Self-pay | Admitting: Internal Medicine

## 2012-01-18 ENCOUNTER — Ambulatory Visit (INDEPENDENT_AMBULATORY_CARE_PROVIDER_SITE_OTHER): Payer: BC Managed Care – PPO | Admitting: Internal Medicine

## 2012-01-18 VITALS — BP 114/82 | HR 76 | Wt 203.4 lb

## 2012-01-18 DIAGNOSIS — E785 Hyperlipidemia, unspecified: Secondary | ICD-10-CM

## 2012-01-18 MED ORDER — ATORVASTATIN CALCIUM 40 MG PO TABS: 40.0000 mg | ORAL_TABLET | Freq: Every day | ORAL | Status: DC

## 2012-01-18 NOTE — Progress Notes (Signed)
  Subjective:    Patient ID: [name] [name], male    DOB: January 17, 1956, 56 y.o.   MRN: [id number]  HPI Dyslipidemia assessment: Prior Advanced Lipid Testing: NMR  Lipoprofile in 2007 documented genetic risks and LDL goal of less than 100, ideally less than 70   Family history of premature CAD/ MI: no but MI in 22s among father, both maternal GPs.  Nutrition: modified  heart healthy.  Exercise: 60-75 min 3X/ week . Diabetes : A1c 5.4 % .  HTN: no. Smoking history  : never .   Weight : stable.  Lab results reviewed :On Crestor 20 mg daily his LDL is 97; HDL is 62.8. On atorvastatin 20 mg his LDL was 130.7. This represents approximate third decrease in long-term risk. There has been a mild increase in his ALT to 54. AST is normal    Review of Systems Constitutional:no fever, chills, sweats,significant  weight change, fatigue, sleep issues Cardiovascular:no chest pain, syncope, diaphoresis, claudication GI:no change in bowels, anorexia Derm: no skin, hair, or nail changes Neurologic:no tremor, numbness and tingling Psych:no significant anxiety, depression Endocrine:no hoarseness, temperature intolerance PMH: no cardiac, endocrinologic history                                                         Objective:   Physical Exam He appears healthy and well-nourished; he is in no acute distress  No carotid bruits are present.  Heart rhythm and rate are normal with no significant murmurs or gallops.  Chest is clear with no increased work of breathing  There is no evidence of aortic aneurysm or renal artery bruits  He has no clubbing or edema.   Pedal pulses are intact   No ischemic skin changes are present         Assessment & Plan:

## 2012-01-18 NOTE — Patient Instructions (Addendum)
Please  schedule fasting Labs in 10 weeks : CK,Lipids, hepatic panel. PLEASE BRING THESE INSTRUCTIONS TO FOLLOW UP  LAB APPOINTMENT.This will guarantee correct labs are drawn, eliminating need for repeat blood sampling ( needle sticks ! ). Diagnoses /Codes: 272.4, 995.20 Please try to go on My Chart within the next 24 hours to allow me to release the results directly to you.

## 2012-01-18 NOTE — Assessment & Plan Note (Signed)
The Crestor 20 mg will be changed to atorvastatin 40 with repeat labs after 10 weeks as cost is significantly better.

## 2012-05-07 ENCOUNTER — Other Ambulatory Visit (INDEPENDENT_AMBULATORY_CARE_PROVIDER_SITE_OTHER): Payer: BC Managed Care – PPO

## 2012-05-07 ENCOUNTER — Ambulatory Visit (INDEPENDENT_AMBULATORY_CARE_PROVIDER_SITE_OTHER): Payer: BC Managed Care – PPO

## 2012-05-07 DIAGNOSIS — T887XXA Unspecified adverse effect of drug or medicament, initial encounter: Secondary | ICD-10-CM

## 2012-05-07 DIAGNOSIS — Z23 Encounter for immunization: Secondary | ICD-10-CM

## 2012-05-07 DIAGNOSIS — E785 Hyperlipidemia, unspecified: Secondary | ICD-10-CM

## 2012-05-07 LAB — HEPATIC FUNCTION PANEL
Alkaline Phosphatase: 52 U/L (ref 39–117)
Bilirubin, Direct: 0.1 mg/dL (ref 0.0–0.3)
Total Bilirubin: 0.8 mg/dL (ref 0.3–1.2)
Total Protein: 6.7 g/dL (ref 6.0–8.3)

## 2012-05-07 LAB — CK: Total CK: 76 U/L (ref 7–232)

## 2012-05-07 LAB — LIPID PANEL
Cholesterol: 166 mg/dL (ref 0–200)
LDL Cholesterol: 89 mg/dL (ref 0–99)

## 2012-05-08 ENCOUNTER — Encounter: Payer: Self-pay | Admitting: Internal Medicine

## 2012-05-09 ENCOUNTER — Encounter: Payer: Self-pay | Admitting: Internal Medicine

## 2012-08-12 ENCOUNTER — Other Ambulatory Visit: Payer: Self-pay | Admitting: Internal Medicine

## 2012-08-12 MED ORDER — OMEPRAZOLE 20 MG PO CPDR: DELAYED_RELEASE_CAPSULE | ORAL | Status: DC

## 2012-08-12 NOTE — Telephone Encounter (Signed)
REFILL Omeprazole 20 MG TAKE 1 CAPSULE DAILY Requesting 90-day supply last not listed last wrt 5.6.13

## 2012-08-12 NOTE — Telephone Encounter (Signed)
RX sent

## 2012-10-05 ENCOUNTER — Encounter (HOSPITAL_BASED_OUTPATIENT_CLINIC_OR_DEPARTMENT_OTHER): Payer: Self-pay | Admitting: *Deleted

## 2012-10-05 ENCOUNTER — Emergency Department (HOSPITAL_BASED_OUTPATIENT_CLINIC_OR_DEPARTMENT_OTHER): Payer: BC Managed Care – PPO

## 2012-10-05 ENCOUNTER — Emergency Department (HOSPITAL_BASED_OUTPATIENT_CLINIC_OR_DEPARTMENT_OTHER)
Admission: EM | Admit: 2012-10-05 | Discharge: 2012-10-05 | Disposition: A | Discharge status: Home / Self Care | Payer: BC Managed Care – PPO | Attending: Emergency Medicine | Admitting: Emergency Medicine

## 2012-10-05 DIAGNOSIS — R05 Cough: Secondary | ICD-10-CM | POA: Insufficient documentation

## 2012-10-05 DIAGNOSIS — A084 Viral intestinal infection, unspecified: Secondary | ICD-10-CM

## 2012-10-05 DIAGNOSIS — K219 Gastro-esophageal reflux disease without esophagitis: Secondary | ICD-10-CM | POA: Insufficient documentation

## 2012-10-05 DIAGNOSIS — E785 Hyperlipidemia, unspecified: Secondary | ICD-10-CM | POA: Insufficient documentation

## 2012-10-05 DIAGNOSIS — F329 Major depressive disorder, single episode, unspecified: Secondary | ICD-10-CM | POA: Insufficient documentation

## 2012-10-05 DIAGNOSIS — F411 Generalized anxiety disorder: Secondary | ICD-10-CM | POA: Insufficient documentation

## 2012-10-05 DIAGNOSIS — E7800 Pure hypercholesterolemia, unspecified: Secondary | ICD-10-CM | POA: Insufficient documentation

## 2012-10-05 DIAGNOSIS — Z8679 Personal history of other diseases of the circulatory system: Secondary | ICD-10-CM | POA: Insufficient documentation

## 2012-10-05 DIAGNOSIS — R112 Nausea with vomiting, unspecified: Secondary | ICD-10-CM | POA: Insufficient documentation

## 2012-10-05 DIAGNOSIS — R197 Diarrhea, unspecified: Secondary | ICD-10-CM | POA: Insufficient documentation

## 2012-10-05 DIAGNOSIS — Z9889 Other specified postprocedural states: Secondary | ICD-10-CM | POA: Insufficient documentation

## 2012-10-05 DIAGNOSIS — Z79899 Other long term (current) drug therapy: Secondary | ICD-10-CM | POA: Insufficient documentation

## 2012-10-05 DIAGNOSIS — A088 Other specified intestinal infections: Secondary | ICD-10-CM | POA: Insufficient documentation

## 2012-10-05 DIAGNOSIS — R059 Cough, unspecified: Secondary | ICD-10-CM | POA: Insufficient documentation

## 2012-10-05 DIAGNOSIS — F3289 Other specified depressive episodes: Secondary | ICD-10-CM | POA: Insufficient documentation

## 2012-10-05 DIAGNOSIS — Z87442 Personal history of urinary calculi: Secondary | ICD-10-CM | POA: Insufficient documentation

## 2012-10-05 DIAGNOSIS — Z7982 Long term (current) use of aspirin: Secondary | ICD-10-CM | POA: Insufficient documentation

## 2012-10-05 LAB — LIPASE, BLOOD: Lipase: 22 U/L (ref 11–59)

## 2012-10-05 LAB — CBC WITH DIFFERENTIAL/PLATELET
Eosinophils Absolute: 0 10*3/uL (ref 0.0–0.7)
Eosinophils Relative: 0 % (ref 0–5)
HCT: 47.7 % (ref 39.0–52.0)
Hemoglobin: 16.7 g/dL (ref 13.0–17.0)
Lymphocytes Relative: 2 % — ABNORMAL LOW (ref 12–46)
Lymphs Abs: 0.3 10*3/uL — ABNORMAL LOW (ref 0.7–4.0)
MCH: 30.8 pg (ref 26.0–34.0)
MCV: 87.8 fL (ref 78.0–100.0)
Monocytes Absolute: 0.7 10*3/uL (ref 0.1–1.0)
Monocytes Relative: 5 % (ref 3–12)
RBC: 5.43 MIL/uL (ref 4.22–5.81)

## 2012-10-05 LAB — COMPREHENSIVE METABOLIC PANEL
BUN: 28 mg/dL — ABNORMAL HIGH (ref 6–23)
CO2: 26 mEq/L (ref 19–32)
Calcium: 9.7 mg/dL (ref 8.4–10.5)
GFR calc Af Amer: >90 mL/min (ref >90)
GFR calc non Af Amer: 82 mL/min — ABNORMAL LOW (ref >90)
Glucose, Bld: 142 mg/dL — ABNORMAL HIGH (ref 70–99)
Total Protein: 7.4 g/dL (ref 6.0–8.3)

## 2012-10-05 LAB — URINALYSIS, ROUTINE W REFLEX MICROSCOPIC
Glucose, UA: NEGATIVE mg/dL
Hgb urine dipstick: NEGATIVE
Specific Gravity, Urine: 1.03 (ref 1.005–1.030)
Urobilinogen, UA: 1 mg/dL (ref 0.0–1.0)
pH: 7.5 (ref 5.0–8.0)

## 2012-10-05 LAB — URINE MICROSCOPIC-ADD ON

## 2012-10-05 MED ORDER — SODIUM CHLORIDE 0.9 % IV SOLN
1000.0000 mL | INTRAVENOUS | Status: DC
  Administered 2012-10-05: 1000 mL via INTRAVENOUS

## 2012-10-05 MED ORDER — OXYCODONE-ACETAMINOPHEN 5-325 MG PO TABS: 1.0000 | ORAL_TABLET | ORAL | Status: DC | PRN

## 2012-10-05 MED ORDER — ONDANSETRON HCL 4 MG/2ML IJ SOLN: 4.0000 mg | Freq: Once | INTRAMUSCULAR | Status: AC

## 2012-10-05 MED ORDER — IOHEXOL 300 MG/ML  SOLN
50.0000 mL | Freq: Once | INTRAMUSCULAR | Status: AC | PRN
  Administered 2012-10-05: 50 mL via ORAL

## 2012-10-05 MED ORDER — IOHEXOL 300 MG/ML  SOLN
100.0000 mL | Freq: Once | INTRAMUSCULAR | Status: AC | PRN
  Administered 2012-10-05: 100 mL via INTRAVENOUS

## 2012-10-05 MED ORDER — ONDANSETRON HCL 4 MG/2ML IJ SOLN
4.0000 mg | Freq: Once | INTRAMUSCULAR | Status: AC
  Administered 2012-10-05: 4 mg via INTRAVENOUS
  Filled 2012-10-05: qty 2

## 2012-10-05 MED ORDER — HYDROMORPHONE HCL PF 1 MG/ML IJ SOLN
1.0000 mg | Freq: Once | INTRAMUSCULAR | Status: AC
  Administered 2012-10-05: 1 mg via INTRAVENOUS
  Filled 2012-10-05: qty 1

## 2012-10-05 NOTE — ED Notes (Signed)
Pt c/o abd pain, N/V/D onset 0200

## 2012-10-05 NOTE — ED Provider Notes (Signed)
History    This chart was scribed for Carleene Cooper III, MD scribed by Magnus Sinning. The patient was seen in room MH04/MH04 at 17:04   CSN: [id number]  Arrival date & time 10/05/12  1606   Chief Complaint  Patient presents with  . Abdominal Pain    (Consider location/radiation/quality/duration/timing/severity/associated sxs/prior treatment) Patient is a 57 y.o. male presenting with abdominal pain. The history is provided by the patient. No language interpreter was used.  Abdominal Pain Associated symptoms: cough (Dry cough), diarrhea, nausea and vomiting   Associated symptoms: no chest pain, no fever and no sore throat    [name] [name] is a 57 y.o. male who presents to the Emergency Department complaining of constant severe epigastric pain, onset over 12 hours ago with associated constant emesis and diarrhea that began later in the morning. The patient provides that the abd pain woke him from his sleep and he states the abd pain has since radiated into upper abd. He notes that for the past two weeks he has had lower back pain and right sided hip pain. The patient states he treats for high cholesterol, depression, and anxiety. He explains that he's also had surgery and lithotripsy for treatment of hx of kidney stones, which he explains he has not experienced in 2 years.   Past Medical History  Diagnosis Date  . Hyperlipidemia   . GERD (gastroesophageal reflux disease)   . Chest pain 2009    negative nuclear stress test    Past Surgical History  Procedure Laterality Date  . Lithotripsy       X 1  . Renal calculus      Cystoscopy , Dr Marcello Fennel  . Tonsillectomy and adenoidectomy    . Colonoscopy with polypectomy  2007    Dr Marina Goodell , negative 06/2010  . Upper gastrointestinal endoscopy      negative    Family History  Problem Relation Age of Onset  . Hypertension Mother   . Cancer Mother     breast & skin  . Heart disease Father     MI in 83s, CBAG  . Diabetes Father    . Heart disease Maternal Grandmother     MI in 68s  . Heart disease Maternal Grandfather     Mi in 31s  . Dementia Mother     History  Substance Use Topics  . Smoking status: Never Smoker   . Smokeless tobacco: Not on file  . Alcohol Use: 8.4 oz/week    14 Glasses of wine per week   The patient states that he has two mixed drinks a day or glasses of wine. He denies smoking.    Review of Systems  Constitutional: Negative for fever.  HENT: Negative for ear pain and sore throat.   Respiratory: Positive for cough (Dry cough).   Cardiovascular: Negative for chest pain.  Gastrointestinal: Positive for nausea, vomiting, abdominal pain and diarrhea.  Genitourinary: Negative for difficulty urinating.  Skin: Negative for rash.  Neurological: Negative for seizures and syncope.  All other systems reviewed and are negative.    Allergies  Codeine  Home Medications   Current Outpatient Rx  Name  Route  Sig  Dispense  Refill  . aspirin 81 MG tablet   Oral   Take 81 mg by mouth daily.         Marland Kitchen atorvastatin (LIPITOR) 40 MG tablet   Oral   Take 1 tablet (40 mg total) by mouth daily.   90 tablet  3   . buPROPion (WELLBUTRIN XL) 300 MG 24 hr tablet   Oral   Take 300 mg by mouth daily.         Marland Kitchen LORazepam (ATIVAN) 1 MG tablet   Oral   Take 1 mg by mouth. 1/2-1 by mouth three times daily as needed         . Multiple Vitamin (MULTIVITAMIN) tablet   Oral   Take 1 tablet by mouth daily.         . Omega-3 Fatty Acids (FISH OIL) 1000 MG CAPS   Oral   Take by mouth 2 (two) times daily.         Marland Kitchen omeprazole (PRILOSEC) 20 MG capsule      TAKE 1 CAPSULE DAILY   90 capsule   1     BP 113/58  Pulse 71  Temp(Src) 98 F (36.7 C) (Oral)  Resp 16  Ht 5' 11.5" (1.816 m)  Wt 195 lb (88.451 kg)  BMI 26.82 kg/m2  SpO2 100%  Physical Exam  Nursing note and vitals reviewed. Constitutional: He is oriented to person, place, and time. He appears well-developed and  well-nourished. No distress.  HENT:  Head: Normocephalic and atraumatic.  Mouth/Throat: Oropharynx is clear and moist.  Eyes: Conjunctivae and EOM are normal. Pupils are equal, round, and reactive to light.  Neck: Neck supple. No tracheal deviation present.  Cardiovascular: Normal rate, regular rhythm and normal heart sounds.   No murmur heard. Pulmonary/Chest: Effort normal and breath sounds normal. No respiratory distress. He has no wheezes. He has no rales.  Abdominal: Soft. Bowel sounds are normal. He exhibits no distension and no mass. There is tenderness. There is no rebound and no guarding.  Mild epigastric tenderness. No rebound, guarding, masses, or abd rigidity   Musculoskeletal: Normal range of motion. He exhibits no edema and no tenderness.  Neurological: He is alert and oriented to person, place, and time. No cranial nerve deficit or sensory deficit.  Skin: Skin is warm and dry.  Psychiatric: He has a normal mood and affect. His behavior is normal.    ED Course  Procedures (including critical care time) DIAGNOSTIC STUDIES: Oxygen Saturation is 100% on room air, normal by my interpretation.    COORDINATION OF CARE: 17:09: Physical exam performed.  19:44: Rechecked performed. Patient notes improvement. He says pain has resolved. Provided laboratory results and notified patient that no acute abnormalities were found on CT. Intent given to send report of ED visit to Dr. Titus Dubin. Wyvonnia Lora, MD. Advice given to take clear liquids. Will prescribe percocet tablets and provided recommendations to follow-up with PCP on Monday.  Labs Reviewed  URINALYSIS, ROUTINE W REFLEX MICROSCOPIC - Abnormal; Notable for the following:    Color, Urine AMBER (*)    Bilirubin Urine SMALL (*)    Ketones, ur 15 (*)    Protein, ur 30 (*)    Leukocytes, UA SMALL (*)    All other components within normal limits  CBC WITH DIFFERENTIAL - Abnormal; Notable for the following:    WBC 12.9 (*)     Neutrophils Relative 92 (*)    Neutro Abs 11.9 (*)    Lymphocytes Relative 2 (*)    Lymphs Abs 0.3 (*)    All other components within normal limits  COMPREHENSIVE METABOLIC PANEL - Abnormal; Notable for the following:    Glucose, Bld 142 (*)    BUN 28 (*)    GFR calc non Af Amer 82 (*)  All other components within normal limits  LIPASE, BLOOD  URINE MICROSCOPIC-ADD ON   Ct Abdomen Pelvis W Contrast  10/05/2012  *RADIOLOGY REPORT*  Clinical Data: Epigastric abdominal pain, nausea and vomiting.  CT ABDOMEN AND PELVIS WITH CONTRAST  Technique:  Multidetector CT imaging of the abdomen and pelvis was performed following the standard protocol during bolus administration of intravenous contrast.  Contrast: 50mL OMNIPAQUE IOHEXOL 300 MG/ML  SOLN, OMNIPAQUE IOHEXOL 300 MG/ML  SOLN  Comparison: 04/04/2008.  Findings: No significant change in small liver cysts.  A low density mass in the anterior segment of the right lobe of the liver, peripherally, has not changed significantly in size, measuring 4.6 cm in maximum diameter on image number 24.  There is a suggestion of some puddling peripheral contrast enhancement on the initial images, suggesting an hemangioma.  No delayed images were obtained through this region.  The previous CT examinations were without contrast.  Multiple small bilateral renal calculi.  The largest right renal calculus measures 5 mm in maximum diameter and the largest left renal calculus measures 5 mm in maximum diameter as well.  No ureteral calculi or hydronephrosis.  No bladder calculi.  Mildly enlarged prostate gland containing coarse calcifications. No gastrointestinal abnormalities or enlarged lymph nodes.  Normal appearing appendix.  Unremarkable spleen, pancreas, gallbladder and adrenal glands. Minimal linear scarring at the lung bases.  Mild lumbar and lower thoracic spine degenerative changes.  IMPRESSION:  1.  No acute abnormality. 2.  Multiple small, bilateral,  nonobstructing renal calculi. 3.  Stable liver cysts and possible hemangioma.  The long-term stability is compatible with a benign process.   Original Report Authenticated By: Beckie Salts, M.D.    Lab workup was essentially negative.  He is pain free after pain and antinausea meds and IV fluids.  Advised clear liquids, Percocet if needed for pain, followup with Dr. Elaina Hoops on Monday, 36 hours from now.  1. Viral gastroenteritis      I personally performed the services described in this documentation, which was scribed in my presence. The recorded information has been reviewed and is accurate.  Osvaldo Human, MD       Carleene Cooper III, MD 10/05/12 [phone]

## 2012-10-07 ENCOUNTER — Ambulatory Visit (INDEPENDENT_AMBULATORY_CARE_PROVIDER_SITE_OTHER): Payer: BC Managed Care – PPO | Admitting: Internal Medicine

## 2012-10-07 ENCOUNTER — Encounter: Payer: Self-pay | Admitting: Internal Medicine

## 2012-10-07 ENCOUNTER — Telehealth: Payer: Self-pay | Admitting: Internal Medicine

## 2012-10-07 VITALS — BP 112/78 | HR 76 | Temp 98.0°F | Wt 197.0 lb

## 2012-10-07 DIAGNOSIS — R82998 Other abnormal findings in urine: Secondary | ICD-10-CM

## 2012-10-07 DIAGNOSIS — R7309 Other abnormal glucose: Secondary | ICD-10-CM

## 2012-10-07 DIAGNOSIS — R197 Diarrhea, unspecified: Secondary | ICD-10-CM

## 2012-10-07 DIAGNOSIS — K7689 Other specified diseases of liver: Secondary | ICD-10-CM | POA: Insufficient documentation

## 2012-10-07 DIAGNOSIS — R8290 Unspecified abnormal findings in urine: Secondary | ICD-10-CM

## 2012-10-07 LAB — CBC WITH DIFFERENTIAL/PLATELET
Basophils Relative: 0.3 % (ref 0.0–3.0)
Eosinophils Relative: 1.8 % (ref 0.0–5.0)
Monocytes Relative: 10.5 % (ref 3.0–12.0)
Neutrophils Relative %: 76.8 % (ref 43.0–77.0)
Platelets: 219 10*3/uL (ref 150.0–400.0)
RBC: 5.08 Mil/uL (ref 4.22–5.81)
WBC: 8 10*3/uL (ref 4.5–10.5)

## 2012-10-07 LAB — POCT URINALYSIS DIPSTICK
Glucose, UA: NEGATIVE
Ketones, UA: NEGATIVE
Leukocytes, UA: NEGATIVE
Protein, UA: NEGATIVE
Urobilinogen, UA: 0.2

## 2012-10-07 LAB — HEMOGLOBIN A1C: Hgb A1c MFr Bld: 5.4 % (ref 4.6–6.5)

## 2012-10-07 NOTE — Telephone Encounter (Signed)
Patient here now to be seen

## 2012-10-07 NOTE — Progress Notes (Signed)
  Subjective:    Patient ID: [name] [name], male    DOB: June 08, 1956, 57 y.o.   MRN: [id number]  HPI  Emergency room record of 10/05/12 was reviewed. He was seen with abdominal pain in the context of nausea and vomiting as well as frank diarrhea which had been present for approximately 12 hours.  At the time of discharge the nausea and vomiting pain had resolved. He has continued to have frank diarrhea approximately every 2 hours since. This has not been treated except for fluids orally.  Lab abnormalities included BUN  28, GFR 82, glucose 142, white count 12,900, neutrophils 92, and urinalysis with small leukocytes. No culture was collected  Imaging revealed bilateral nonobstructing renal calculi, hepatic cyst, & hemangioma which were felt to be stable.      Review of Systems Weight loss: ? Decreased urine output: slightly  Pyuria/dysuria/hematuria:no Lightheadedness: intermittent Recent travel history:no   Sick contacts:no   Suspicious food or water: no Change in diet: no  Red Flags Fever/chills/sweats: no Bloody stools: no  Recent antibiotics: no       Objective:   Physical Exam  General appearance is one of good health and nourishment w/o distress.  Eyes: No conjunctival inflammation or scleral icterus is present.  Neck: Supple; no meningeal signs  Oral exam: Dental hygiene is good; lips and gums are healthy appearing.Tongue moist. There is no oropharyngeal erythema or exudate noted.   Heart:  Normal rate and regular rhythm. S1 and S2 normal without gallop, murmur, click, rub or other extra sounds     Lungs:Chest clear to auscultation; no wheezes, rhonchi,rales ,or rubs present.No increased work of breathing.   Abdomen: bowel sounds normal, soft and non-tender without masses, organomegaly or hernias noted.  No guarding or rebound   Skin:Warm & dry.  Intact without suspicious lesions or rashes ; no jaundice or tenting  Musculoskeletal: Negative straight leg  raising; no meningeal signs  Lymphatic: No lymphadenopathy is noted about the head, neck, axilla, or inguinal areas.            Assessment & Plan:  #1 acute diarrheal illness, persistent  #2 hyperglycemia, nonfasting  #3 nausea , vomiting and abdominal pain resolved  Plan: See orders & recommendations

## 2012-10-07 NOTE — Telephone Encounter (Signed)
[facility] [place] Fax: [phone] From: [facility] Date/ Time: 10/05/2012 3:51 PM Taken By: Baldomero Lamy, RN Caller: Patty [phone]) Facility: Not Collected Patient: Tiegan, Terpstra DOB: [birth year] Phone: [phone] Reason for Call: Caller was unable to be reached on callback - Left Message Regarding Appointment: No Appt Date: Appt Time

## 2012-10-07 NOTE — Patient Instructions (Addendum)
Stay on clear liquids for 48-72 hours or until bowels are normal.This would include  jello, sherbert (NOT ice cream), Lipton's chicken noodle soup(NOT cream based soups),Gatorade Lite, flat Ginger ale (without High Fructose Corn Syrup),dry toast or crackers, baked potato.No milk , dairy or grease until bowels are formed. Align , a Pro Biotic , daily if stools are loose. Immodium AD for frankly watery stool. Report increasing pain, fever or rectal bleeding 

## 2012-10-18 ENCOUNTER — Ambulatory Visit (INDEPENDENT_AMBULATORY_CARE_PROVIDER_SITE_OTHER): Payer: BC Managed Care – PPO | Admitting: Internal Medicine

## 2012-10-18 ENCOUNTER — Encounter: Payer: Self-pay | Admitting: Internal Medicine

## 2012-10-18 VITALS — BP 122/84 | HR 76 | Temp 98.0°F | Resp 12 | Ht 70.5 in | Wt 196.0 lb

## 2012-10-18 DIAGNOSIS — R9431 Abnormal electrocardiogram [ECG] [EKG]: Secondary | ICD-10-CM

## 2012-10-18 DIAGNOSIS — Z0000 Encounter for general adult medical examination without abnormal findings: Secondary | ICD-10-CM

## 2012-10-18 LAB — LIPID PANEL
Cholesterol: 151 mg/dL (ref 0–200)
HDL: 48.4 mg/dL (ref >39.00)
Triglycerides: 70 mg/dL (ref 0.0–149.0)
VLDL: 14 mg/dL (ref 0.0–40.0)

## 2012-10-18 NOTE — Patient Instructions (Addendum)
Preventive Health Care: Exercise at least 30-45 minutes a day,  3-4 days a week.  Eat a low-fat diet with lots of fruits and vegetables, up to 7-9 servings per day. This would eliminate the need for vitamin supplements. Consume less than 40 grams of sugar (preferably ZERO) per day from foods & drinks with High Fructose Corn Sugar as #1,2,3 or # 4 on label. Take the EKG to any emergency room or preop visits. There are nonspecific changes; as long as there is no new change these are not clinically significant . If the old EKG is not available for comparison; it may result in unnecessary hospitalization for observation with significant unnecessary expense.

## 2012-10-18 NOTE — Progress Notes (Signed)
  Subjective:    Patient ID: [name] [name], male    DOB: 1956/01/11, 57 y.o.   MRN: [id number]  HPI  He is here for a physical; he denies acute issues .     Review of Systems He is on a modified  heart healthy diet; he exercises 30-60 minutes 4-6 times per week @ gym without symptoms. Specifically he denies chest pain, palpitations, dyspnea, or claudication. Family history is negative for premature coronary disease but positive beyond 94. Advanced cholesterol testing reveals his LDL goal was less than 100, ideally < 75 .     Objective:   Physical Exam Gen.: Healthy and well-nourished in appearance. Alert, appropriate and cooperative throughout exam. Head: Normocephalic without obvious abnormalities Eyes: No corneal or conjunctival inflammation noted.  Extraocular motion intact. Vision grossly normal with lenses Ears: External  ear exam reveals no significant lesions or deformities. Canals clear .TMs normal. Hearing is grossly normal bilaterally. Nose: External nasal exam reveals no deformity or inflammation. Nasal mucosa are pink and moist. No lesions or exudates noted.   Mouth: Oral mucosa and oropharynx reveal no lesions or exudates. Teeth in good repair. Neck: No deformities, masses, or tenderness noted. Range of motion & Thyroid normal. Lungs: Normal respiratory effort; chest expands symmetrically. Lungs are clear to auscultation without rales, wheezes, or increased work of breathing. Heart: Normal rate and rhythm. Normal S1 and S2. No gallop, click, or rub. S4;no murmur. Abdomen: Bowel sounds normal; abdomen soft and nontender. No masses, organomegaly or hernias noted. Genitalia: Genitalia normal except for left varices. Prostate is upper limits of normal without asymmetry, nodularity, or induration.                                Musculoskeletal/extremities: No deformity or scoliosis noted of  the thoracic or lumbar spine.  No clubbing, cyanosis, edema, or significant extremity   deformity noted. Range of motion normal .Tone & strength  Normal. Joints normal. Nail health good. Able to lie down & sit up w/o help. Negative SLR bilaterally Vascular: Carotid, radial artery, dorsalis pedis and  posterior tibial pulses are full and equal. No bruits present. Neurologic: Alert and oriented x3. Deep tendon reflexes symmetrical and normal.         Skin: Intact without suspicious lesions or rashes. Lymph: No cervical, axillary, or inguinal lymphadenopathy present. Psych: Mood and affect are normal. Normally interactive                                                                                      Assessment & Plan:  #1 comprehensive physical exam; no acute findings  Plan: see Orders  & Recommendations

## 2012-10-21 ENCOUNTER — Encounter: Payer: Self-pay | Admitting: Internal Medicine

## 2012-10-22 ENCOUNTER — Encounter: Payer: Self-pay | Admitting: Internal Medicine

## 2012-11-07 HISTORY — PX: KNEE ARTHROSCOPY W/ MENISCAL REPAIR: SHX1877

## 2012-11-13 ENCOUNTER — Encounter: Payer: Self-pay | Admitting: Internal Medicine

## 2012-11-22 ENCOUNTER — Other Ambulatory Visit: Payer: Self-pay | Admitting: Orthopedic Surgery

## 2012-11-22 DIAGNOSIS — M25561 Pain in right knee: Secondary | ICD-10-CM

## 2012-11-29 ENCOUNTER — Ambulatory Visit
Admission: RE | Admit: 2012-11-29 | Discharge: 2012-11-29 | Disposition: A | Discharge status: Home / Self Care | Payer: BC Managed Care – PPO | Source: Ambulatory Visit | Attending: Orthopedic Surgery | Admitting: Orthopedic Surgery

## 2012-11-29 DIAGNOSIS — M25561 Pain in right knee: Secondary | ICD-10-CM

## 2012-12-16 DIAGNOSIS — Z9889 Other specified postprocedural states: Secondary | ICD-10-CM | POA: Insufficient documentation

## 2013-01-13 ENCOUNTER — Other Ambulatory Visit: Payer: Self-pay | Admitting: Internal Medicine

## 2013-02-14 ENCOUNTER — Other Ambulatory Visit: Payer: Self-pay | Admitting: *Deleted

## 2013-02-14 MED ORDER — OMEPRAZOLE 20 MG PO CPDR: DELAYED_RELEASE_CAPSULE | ORAL | Status: DC

## 2013-02-14 NOTE — Telephone Encounter (Signed)
Pt requested refilled on omeprazole 20mg  90 day.Last seen on 10/18/2012. Rx sent to [facility].  //SW//CMA

## 2013-04-10 DIAGNOSIS — F339 Major depressive disorder, recurrent, unspecified: Secondary | ICD-10-CM | POA: Insufficient documentation

## 2013-04-10 DIAGNOSIS — F411 Generalized anxiety disorder: Secondary | ICD-10-CM | POA: Insufficient documentation

## 2013-05-15 ENCOUNTER — Other Ambulatory Visit: Payer: Self-pay

## 2013-06-09 ENCOUNTER — Other Ambulatory Visit: Payer: Self-pay | Admitting: Urology

## 2013-06-16 ENCOUNTER — Encounter (HOSPITAL_BASED_OUTPATIENT_CLINIC_OR_DEPARTMENT_OTHER): Payer: Self-pay | Admitting: *Deleted

## 2013-06-16 NOTE — Progress Notes (Signed)
NPO AFTER MN WITH EXCEPTION WATER/ GATORADE/ BLACK COFFEE (NO CREAM/ MILK PRODUCTS) UNTIL 0700. ARRIVE AT 1145. NEEDS ISTAT 8. WILL TAKE LIPITOR AND PRILOSEC AM DOS W/ SIPS OF WATER AND IF NEEDED OXYCODONE.

## 2013-06-18 ENCOUNTER — Encounter (HOSPITAL_BASED_OUTPATIENT_CLINIC_OR_DEPARTMENT_OTHER)
Admission: RE | Disposition: A | Discharge status: Home / Self Care | Payer: Self-pay | Source: Ambulatory Visit | Attending: Urology

## 2013-06-18 ENCOUNTER — Encounter (HOSPITAL_BASED_OUTPATIENT_CLINIC_OR_DEPARTMENT_OTHER): Payer: BC Managed Care – PPO | Admitting: Anesthesiology

## 2013-06-18 ENCOUNTER — Ambulatory Visit (HOSPITAL_BASED_OUTPATIENT_CLINIC_OR_DEPARTMENT_OTHER)
Admission: RE | Admit: 2013-06-18 | Discharge: 2013-06-18 | Disposition: A | Discharge status: Home / Self Care | Payer: BC Managed Care – PPO | Source: Ambulatory Visit | Attending: Urology | Admitting: Urology

## 2013-06-18 ENCOUNTER — Encounter (HOSPITAL_BASED_OUTPATIENT_CLINIC_OR_DEPARTMENT_OTHER): Payer: Self-pay | Admitting: *Deleted

## 2013-06-18 ENCOUNTER — Ambulatory Visit (HOSPITAL_BASED_OUTPATIENT_CLINIC_OR_DEPARTMENT_OTHER): Payer: BC Managed Care – PPO | Admitting: Anesthesiology

## 2013-06-18 DIAGNOSIS — N200 Calculus of kidney: Secondary | ICD-10-CM | POA: Insufficient documentation

## 2013-06-18 DIAGNOSIS — K219 Gastro-esophageal reflux disease without esophagitis: Secondary | ICD-10-CM | POA: Insufficient documentation

## 2013-06-18 DIAGNOSIS — E785 Hyperlipidemia, unspecified: Secondary | ICD-10-CM | POA: Insufficient documentation

## 2013-06-18 DIAGNOSIS — N201 Calculus of ureter: Secondary | ICD-10-CM | POA: Insufficient documentation

## 2013-06-18 HISTORY — PX: CYSTOSCOPY WITH RETROGRADE PYELOGRAM, URETEROSCOPY AND STENT PLACEMENT: SHX5789

## 2013-06-18 HISTORY — DX: Unspecified osteoarthritis, unspecified site: M19.90

## 2013-06-18 HISTORY — DX: Anxiety disorder, unspecified: F41.9

## 2013-06-18 HISTORY — PX: HOLMIUM LASER APPLICATION: SHX5852

## 2013-06-18 HISTORY — DX: Personal history of urinary calculi: Z87.442

## 2013-06-18 HISTORY — DX: Unspecified right bundle-branch block: I45.10

## 2013-06-18 LAB — POCT I-STAT, CHEM 8
Chloride: 105 mEq/L (ref 96–112)
Creatinine, Ser: 1.1 mg/dL (ref 0.50–1.35)
Glucose, Bld: 96 mg/dL (ref 70–99)
Hemoglobin: 17.3 g/dL — ABNORMAL HIGH (ref 13.0–17.0)
Potassium: 4.1 mEq/L (ref 3.5–5.1)
TCO2: 24 mmol/L (ref 0–100)

## 2013-06-18 SURGERY — CYSTOURETEROSCOPY, WITH RETROGRADE PYELOGRAM AND STENT INSERTION
Anesthesia: General | Site: Ureter | Laterality: Bilateral

## 2013-06-18 MED ORDER — GENTAMICIN IN SALINE 1.6-0.9 MG/ML-% IV SOLN
80.0000 mg | INTRAVENOUS | Status: DC
  Filled 2013-06-18: qty 50

## 2013-06-18 MED ORDER — OXYCODONE-ACETAMINOPHEN 5-325 MG PO TABS
ORAL_TABLET | ORAL | Status: AC
  Filled 2013-06-18: qty 1

## 2013-06-18 MED ORDER — FENTANYL CITRATE 0.05 MG/ML IJ SOLN
INTRAMUSCULAR | Status: AC
  Filled 2013-06-18: qty 4

## 2013-06-18 MED ORDER — SULFAMETHOXAZOLE-TMP DS 800-160 MG PO TABS: 1.0000 | ORAL_TABLET | Freq: Two times a day (BID) | ORAL | Status: DC

## 2013-06-18 MED ORDER — MIDAZOLAM HCL 2 MG/2ML IJ SOLN
INTRAMUSCULAR | Status: AC
  Filled 2013-06-18: qty 2

## 2013-06-18 MED ORDER — SENNOSIDES-DOCUSATE SODIUM 8.6-50 MG PO TABS: 1.0000 | ORAL_TABLET | Freq: Two times a day (BID) | ORAL | Status: DC

## 2013-06-18 MED ORDER — MIDAZOLAM HCL 5 MG/5ML IJ SOLN
INTRAMUSCULAR | Status: DC | PRN
  Administered 2013-06-18: 2 mg via INTRAVENOUS

## 2013-06-18 MED ORDER — OXYBUTYNIN CHLORIDE 5 MG PO TABS
5.0000 mg | ORAL_TABLET | Freq: Three times a day (TID) | ORAL | Status: DC | PRN
  Administered 2013-06-18: 5 mg via ORAL
  Filled 2013-06-18: qty 1

## 2013-06-18 MED ORDER — PROMETHAZINE HCL 25 MG/ML IJ SOLN
6.2500 mg | INTRAMUSCULAR | Status: DC | PRN
  Filled 2013-06-18: qty 1

## 2013-06-18 MED ORDER — DIAZEPAM 10 MG PO TABS: 10.0000 mg | ORAL_TABLET | Freq: Once | ORAL | Status: DC

## 2013-06-18 MED ORDER — KETOROLAC TROMETHAMINE 30 MG/ML IJ SOLN
15.0000 mg | Freq: Once | INTRAMUSCULAR | Status: DC | PRN
  Filled 2013-06-18: qty 1

## 2013-06-18 MED ORDER — SODIUM CHLORIDE 0.9 % IR SOLN
Status: DC | PRN
  Administered 2013-06-18: 2000 mL

## 2013-06-18 MED ORDER — ACETAMINOPHEN 10 MG/ML IV SOLN
INTRAVENOUS | Status: DC | PRN
  Administered 2013-06-18: 1000 mg via INTRAVENOUS

## 2013-06-18 MED ORDER — ONDANSETRON HCL 4 MG/2ML IJ SOLN
INTRAMUSCULAR | Status: DC | PRN
  Administered 2013-06-18: 4 mg via INTRAVENOUS

## 2013-06-18 MED ORDER — LIDOCAINE HCL (CARDIAC) 20 MG/ML IV SOLN
INTRAVENOUS | Status: DC | PRN
  Administered 2013-06-18: 80 mg via INTRAVENOUS

## 2013-06-18 MED ORDER — OXYBUTYNIN CHLORIDE 5 MG PO TABS: 5.0000 mg | ORAL_TABLET | Freq: Three times a day (TID) | ORAL | Status: DC | PRN

## 2013-06-18 MED ORDER — FENTANYL CITRATE 0.05 MG/ML IJ SOLN
25.0000 ug | INTRAMUSCULAR | Status: DC | PRN
  Administered 2013-06-18: 25 ug via INTRAVENOUS
  Filled 2013-06-18: qty 1

## 2013-06-18 MED ORDER — DEXAMETHASONE SODIUM PHOSPHATE 4 MG/ML IJ SOLN
INTRAMUSCULAR | Status: DC | PRN
  Administered 2013-06-18: 10 mg via INTRAVENOUS

## 2013-06-18 MED ORDER — LACTATED RINGERS IV SOLN
INTRAVENOUS | Status: DC
  Administered 2013-06-18 (×4): via INTRAVENOUS
  Filled 2013-06-18: qty 1000

## 2013-06-18 MED ORDER — FENTANYL CITRATE 0.05 MG/ML IJ SOLN
INTRAMUSCULAR | Status: AC
  Filled 2013-06-18: qty 2

## 2013-06-18 MED ORDER — OXYBUTYNIN CHLORIDE 5 MG PO TABS
ORAL_TABLET | ORAL | Status: AC
  Filled 2013-06-18: qty 1

## 2013-06-18 MED ORDER — KETOROLAC TROMETHAMINE 30 MG/ML IJ SOLN
INTRAMUSCULAR | Status: DC | PRN
  Administered 2013-06-18: 30 mg via INTRAVENOUS

## 2013-06-18 MED ORDER — OXYCODONE-ACETAMINOPHEN 7.5-325 MG PO TABS: 1.0000 | ORAL_TABLET | ORAL | Status: DC | PRN

## 2013-06-18 MED ORDER — FENTANYL CITRATE 0.05 MG/ML IJ SOLN
INTRAMUSCULAR | Status: DC | PRN
  Administered 2013-06-18 (×2): 25 ug via INTRAVENOUS
  Administered 2013-06-18 (×2): 50 ug via INTRAVENOUS

## 2013-06-18 MED ORDER — GENTAMICIN SULFATE 40 MG/ML IJ SOLN
5.0000 mg/kg | Freq: Once | INTRAMUSCULAR | Status: DC
  Filled 2013-06-18: qty 11.25

## 2013-06-18 MED ORDER — IOHEXOL 350 MG/ML SOLN
INTRAVENOUS | Status: DC | PRN
  Administered 2013-06-18: 30 mL

## 2013-06-18 MED ORDER — OXYCODONE-ACETAMINOPHEN 5-325 MG PO TABS
1.0000 | ORAL_TABLET | ORAL | Status: DC | PRN
  Administered 2013-06-18: 1 via ORAL
  Filled 2013-06-18: qty 1

## 2013-06-18 MED ORDER — PROPOFOL 10 MG/ML IV BOLUS
INTRAVENOUS | Status: DC | PRN
  Administered 2013-06-18: 50 mg via INTRAVENOUS
  Administered 2013-06-18: 250 mg via INTRAVENOUS

## 2013-06-18 SURGICAL SUPPLY — 43 items
BAG DRAIN URO-CYSTO SKYTR STRL (DRAIN) ×2 IMPLANT
BAG DRN UROCATH (DRAIN) ×1
BASKET LASER NITINOL 1.9FR (BASKET) ×3 IMPLANT
BASKET STNLS GEMINI 4WIRE 3FR (BASKET) IMPLANT
BASKET ZERO TIP NITINOL 2.4FR (BASKET) IMPLANT
BRUSH URET BIOPSY 3F (UROLOGICAL SUPPLIES) IMPLANT
BSKT STON RTRVL 120 1.9FR (BASKET) ×2
BSKT STON RTRVL GEM 120X11 3FR (BASKET)
BSKT STON RTRVL ZERO TP 2.4FR (BASKET)
CANISTER SUCT LVC 12 LTR MEDI- (MISCELLANEOUS) ×2 IMPLANT
CATH INTERMIT  6FR 70CM (CATHETERS) ×2 IMPLANT
CATH URET 5FR 28IN CONE TIP (BALLOONS)
CATH URET 5FR 28IN OPEN ENDED (CATHETERS) IMPLANT
CATH URET 5FR 70CM CONE TIP (BALLOONS) IMPLANT
CLOTH BEACON ORANGE TIMEOUT ST (SAFETY) ×2 IMPLANT
DRAPE CAMERA CLOSED 9X96 (DRAPES) ×2 IMPLANT
ELECT REM PT RETURN 9FT ADLT (ELECTROSURGICAL)
ELECTRODE REM PT RTRN 9FT ADLT (ELECTROSURGICAL) IMPLANT
FIBER LASER FLEXIVA 200 (UROLOGICAL SUPPLIES) ×1 IMPLANT
FIBER LASER FLEXIVA 365 (UROLOGICAL SUPPLIES) IMPLANT
GLOVE BIO SURGEON STRL SZ7.5 (GLOVE) ×2 IMPLANT
GLOVE BIOGEL PI IND STRL 7.5 (GLOVE) IMPLANT
GLOVE BIOGEL PI INDICATOR 7.5 (GLOVE) ×2
GOWN PREVENTION PLUS LG XLONG (DISPOSABLE) ×2 IMPLANT
GOWN STRL REIN XL XLG (GOWN DISPOSABLE) ×3 IMPLANT
GUIDEWIRE 0.038 PTFE COATED (WIRE) IMPLANT
GUIDEWIRE ANG ZIPWIRE 038X150 (WIRE) ×2 IMPLANT
GUIDEWIRE STR DUAL SENSOR (WIRE) ×2 IMPLANT
IV NS 1000ML (IV SOLUTION) ×6
IV NS 1000ML BAXH (IV SOLUTION) IMPLANT
IV NS IRRIG 3000ML ARTHROMATIC (IV SOLUTION) ×2 IMPLANT
KIT BALLIN UROMAX 15FX10 (LABEL) IMPLANT
KIT BALLN UROMAX 15FX4 (MISCELLANEOUS) IMPLANT
KIT BALLN UROMAX 26 75X4 (MISCELLANEOUS)
PACK CYSTOSCOPY (CUSTOM PROCEDURE TRAY) ×2 IMPLANT
SET HIGH PRES BAL DIL (LABEL)
SHEATH ACCESS URETERAL 38CM (SHEATH) ×1 IMPLANT
SHEATH URET ACCESS 12FR/35CM (UROLOGICAL SUPPLIES) IMPLANT
SHEATH URET ACCESS 12FR/55CM (UROLOGICAL SUPPLIES) IMPLANT
STENT POLARIS LOOP 6FR X 26 CM (STENTS) ×2 IMPLANT
SYRINGE 10CC LL (SYRINGE) ×2 IMPLANT
SYRINGE IRR TOOMEY STRL 70CC (SYRINGE) IMPLANT
TUBE FEEDING 8FR 16IN STR KANG (MISCELLANEOUS) IMPLANT

## 2013-06-18 NOTE — H&P (Signed)
[name] [name] is an 57 y.o. male.    Chief Complaint: Pre-op Bilateral Ureteroscopic Stone Manipulation  HPI:   1 - Gross Hematuria - Pt with new gross painless hematuria last week, now resolved. Non-smoker. 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 Does have h/o nephrolithiasis. CT 2014 (today) with rt UPJ stone. Cysto 2014 - pending  2 - Nephrolithiasis - h/o CaOx stones treated with medical therapy, SWL, URS previously. No medical evaluation thus far. NO recent colic since 2011.  05/2013 - CT - RT 6mm UPJ + 3mm lower + Lt 3mm lower pole sotnes, mild right hydro.  PMH sig for TNA, SWL, Ureteroscopy. No CV disease. No strong blood thinners.   Today [name] [name] is seen to proceed with bilateral ureteroscopic stone manipulation for goal of stone free and complete hematuria eval. No interval fevers. Most recent UCX negative.    Past Medical History  Diagnosis Date  . Hyperlipidemia   . GERD (gastroesophageal reflux disease)   . Anxiety   . History of kidney stones   . Renal calculus, bilateral   . RBBB     NOTED IN 2006  . Arthritis   . Wears glasses     Past Surgical History  Procedure Laterality Date  . Upper gastrointestinal endoscopy  2011   GERD  . Extracorporeal shock wave lithotripsy Left JUNE 2009  . Left ureteroscopic stone extraction  04-04-2008  . Colonoscopy  2007  POLYPECTOMY/   2012  NEGATIVE  . Cardiovascular stress test  09-06-2007  DR [facility]    NORMAL NUCLEAR STUDY/  NO ISCHEMIA/  EF 53%  . Knee arthroscopy w/ meniscal repair Right MAY 2014  . Tonsillectomy and adenoidectomy  AS CHILD    Family History  Problem Relation Age of Onset  . Hypertension Mother   . Cancer Mother     breast & skin  . Dementia Mother   . Heart disease Father     MI in 75s, CBAG  . Diabetes Father   . Heart disease Maternal Grandmother     MI in 2s  . Heart disease Maternal Grandfather     Mi in 40s  . Stroke Neg Hx    Social History:  reports that he has never smoked.  He has never used smokeless tobacco. He reports that he drinks about 8.4 ounces of alcohol per week. He reports that he does not use illicit drugs.  Allergies:  Allergies  Allergen Reactions  . Codeine Nausea And Vomiting    No prescriptions prior to admission    No results found for this or any previous visit (from the past 48 hour(s)). No results found.  Review of Systems  Constitutional: Negative.  Negative for fever and chills.  HENT: Negative.   Eyes: Negative.   Respiratory: Negative.   Cardiovascular: Negative.   Gastrointestinal: Negative.   Genitourinary: Positive for hematuria.  Musculoskeletal: Negative.   Skin: Negative.   Neurological: Negative.   Endo/Heme/Allergies: Negative.   Psychiatric/Behavioral: Negative.     Height 5' 11.5" (1.816 m), weight 90.719 kg (200 lb). Physical Exam  Constitutional: He is oriented to person, place, and time. He appears well-developed and well-nourished.  HENT:  Head: Normocephalic.  Eyes: EOM are normal. Pupils are equal, round, and reactive to light.  Neck: Normal range of motion. Neck supple.  Cardiovascular: Normal rate and regular rhythm.   Respiratory: Effort normal and breath sounds normal.  GI: Soft. Bowel sounds are normal.  Genitourinary: Penis normal.  Musculoskeletal: Normal  range of motion.  Neurological: He is alert and oriented to person, place, and time.  Skin: Skin is warm and dry.  Psychiatric: He has a normal mood and affect. His behavior is normal. Judgment and thought content normal.     Assessment/Plan  1 - Gross Hematuria - Eval with labs, imaging, exam, with likely Rt UPJ stone as source. Will complete eval with cysto at time of ureteroscopic treatment.   2 - Nephrolithiasis - Bilateral stone burden, Rt intermitantly obstructing.  We rediscussed ureteroscopic stone manipulation with basketing and laser-lithotripsy in detail.  We rediscussed risks including bleeding, infection, damage to kidney /  ureter  bladder, rarely loss of kidney. We rediscussed anesthetic risks and rare but serious surgical complications including DVT, PE, MI, and mortality. We specifically readdressed that in 5-10% of cases a staged approach is required with stenting followed by re-attempt ureteroscopy if anatomy unfavorable. The patient voiced understanding and wises to proceed. I specifically rementioned that to treat both sides he would need temporary ureteral stents.   Mekaela Azizi 06/18/2013, 6:23 AM

## 2013-06-18 NOTE — Anesthesia Postprocedure Evaluation (Signed)
  Anesthesia Post-op Note  Patient: [name] [name]  Procedure(s) Performed: Procedure(s) (LRB): CYSTOSCOPY WITH RETROGRADE PYELOGRAM, URETEROSCOPY AND STENT PLACEMENT (Bilateral) HOLMIUM LASER APPLICATION (Bilateral)  Patient Location: PACU  Anesthesia Type: General  Level of Consciousness: awake and alert   Airway and Oxygen Therapy: Patient Spontanous Breathing  Post-op Pain: mild  Post-op Assessment: Post-op Vital signs reviewed, Patient's Cardiovascular Status Stable, Respiratory Function Stable, Patent Airway and No signs of Nausea or vomiting  Last Vitals:  Filed Vitals:   06/18/13 1830  BP: 89/51  Pulse: 69  Temp:   Resp: 18    Post-op Vital Signs: stable   Complications: No apparent anesthesia complications

## 2013-06-18 NOTE — Anesthesia Preprocedure Evaluation (Addendum)
Anesthesia Evaluation  Patient identified by MRN, date of birth, ID band Patient awake    Reviewed: Allergy & Precautions, H&P , NPO status , Patient's Chart, lab work & pertinent test results  Airway Mallampati: II TM Distance: >3 FB Neck ROM: Full    Dental no notable dental hx.    Pulmonary neg pulmonary ROS,  breath sounds clear to auscultation  Pulmonary exam normal       Cardiovascular Rhythm:Regular Rate:Normal  RBBB   Neuro/Psych negative neurological ROS  negative psych ROS   GI/Hepatic Neg liver ROS, GERD-  Medicated,  Endo/Other  negative endocrine ROS  Renal/GU negative Renal ROS  negative genitourinary   Musculoskeletal negative musculoskeletal ROS (+)   Abdominal   Peds negative pediatric ROS (+)  Hematology negative hematology ROS (+)   Anesthesia Other Findings   Reproductive/Obstetrics negative OB ROS                          Anesthesia Physical Anesthesia Plan  ASA: II  Anesthesia Plan: General   Post-op Pain Management:    Induction: Intravenous  Airway Management Planned: LMA  Additional Equipment:   Intra-op Plan:   Post-operative Plan:   Informed Consent: I have reviewed the patients History and Physical, chart, labs and discussed the procedure including the risks, benefits and alternatives for the proposed anesthesia with the patient or authorized representative who has indicated his/her understanding and acceptance.   Dental advisory given  Plan Discussed with: CRNA and Surgeon  Anesthesia Plan Comments:         Anesthesia Quick Evaluation

## 2013-06-18 NOTE — Anesthesia Procedure Notes (Signed)
Procedure Name: LMA Insertion Date/Time: 06/18/2013 3:17 PM Performed by: Maris Berger T Pre-anesthesia Checklist: Patient identified, Emergency Drugs available, Suction available and Patient being monitored Patient Re-evaluated:Patient Re-evaluated prior to inductionOxygen Delivery Method: Circle System Utilized Preoxygenation: Pre-oxygenation with 100% oxygen Intubation Type: IV induction Ventilation: Mask ventilation without difficulty LMA: LMA inserted LMA Size: 5.0 Number of attempts: 1 Airway Equipment and Method: bite block Placement Confirmation: positive ETCO2 Dental Injury: Teeth and Oropharynx as per pre-operative assessment

## 2013-06-18 NOTE — Brief Op Note (Signed)
06/18/2013  5:03 PM  PATIENT:  Brita Romp  57 y.o. male  PRE-OPERATIVE DIAGNOSIS:  BILATERAL RENAL STONES  POST-OPERATIVE DIAGNOSIS:  BILATERAL RENAL STONES  PROCEDURE:  Procedure(s): CYSTOSCOPY WITH RETROGRADE PYELOGRAM, URETEROSCOPY AND STENT PLACEMENT (Bilateral) HOLMIUM LASER APPLICATION (Bilateral)  SURGEON:  Surgeon(s) and Role:    * Sebastian Ache, MD - Primary  PHYSICIAN ASSISTANT:   ASSISTANTS: none   ANESTHESIA:   general  EBL:  Total I/O In: 200 [I.V.:200] Out: -   BLOOD ADMINISTERED:none  DRAINS: none   LOCAL MEDICATIONS USED:  NONE  SPECIMEN:  Source of Specimen:  Bilateral Renal Stone Fragments  DISPOSITION OF SPECIMEN:  [facility] for compositional analysis  COUNTS:  YES  TOURNIQUET:  * No tourniquets in log *  DICTATION: .Other Dictation: Dictation Number  [id number]  PLAN OF CARE: Discharge to home after PACU  PATIENT DISPOSITION:  PACU - hemodynamically stable.   Delay start of Pharmacological VTE agent (>24hrs) due to surgical blood loss or risk of bleeding: not applicable

## 2013-06-18 NOTE — Transfer of Care (Signed)
Immediate Anesthesia Transfer of Care Note  Patient: [name] [name]  Procedure(s) Performed: Procedure(s): CYSTOSCOPY WITH RETROGRADE PYELOGRAM, URETEROSCOPY AND STENT PLACEMENT (Bilateral) HOLMIUM LASER APPLICATION (Bilateral)  Patient Location: PACU  Anesthesia Type:General  Level of Consciousness: awake and oriented  Airway & Oxygen Therapy: Patient Spontanous Breathing and Patient connected to nasal cannula oxygen  Post-op Assessment: Report given to PACU RN  Post vital signs: Reviewed and stable  Complications: No apparent anesthesia complications

## 2013-06-19 ENCOUNTER — Encounter (HOSPITAL_BASED_OUTPATIENT_CLINIC_OR_DEPARTMENT_OTHER): Payer: Self-pay | Admitting: Urology

## 2013-06-20 NOTE — Op Note (Signed)
NAME:  [name], [name] NO.:  [id number]  MEDICAL RECORD NO.:  [id number]  LOCATION:                                 FACILITY:  PHYSICIAN:  Sebastian Ache, MD     DATE OF BIRTH:  08/21/1955  DATE OF PROCEDURE: 06/18/2013 DATE OF DISCHARGE:                              OPERATIVE REPORT   DIAGNOSES:  Right ureteral and bilateral renal stones, right flank pain and gross hematuria.  PROCEDURE: 1. Cystoscopy, bilateral retrograde pyelograms interpretation. 2. Bilateral ureteroscopy with laser lithotripsy. 3. Insertion of bilateral ureteral stents, 6 x 24 Polaris, no tether.  FINDINGS: 1. Unremarkable bilateral pyelograms. 2. Fusiform stone at the right UPJ, also lower pole right renal stone. 3. Left lower pole renal stone.  SPECIMENS:  Bilateral renal stones for compositional analysis.  INDICATION:  Mr. Gulla is a pleasant 57 year old gentleman with history of prior nephrolithiasis.  It was found on workup for gross painless hematuria to have a right UPJ stone with mild hydronephrosis as well as bilateral small intrarenal stones, the larger stone was right-sided and 6 mm.  Options were discussed including observation versus shockwave lithotripsy as well as dominant stone versus medical therapy versus ureteroscopy unilateral or bilateral.  He was received the latter for goal of stone free.  Informed consent was obtained and placed in medical record.  PROCEDURE IN DETAIL:  The patient being, [name] [name], was varied. Procedure being bilateral ureteroscopic stone manipulation was confirmed.  Procedure was carried out.  Time-out was performed. Intravenous antibiotics were administered.  General LMA anesthesia was introduced.  Patient placed into a low lithotomy position.  Sterile field was created by prepping the patient's penis, perineum, and proximal thighs using iodine x3.  Next, cystourethroscopy was performed using a 22-French rigid cystoscope with  12-degree offset lens. Inspection of the anterior and posterior urethra unremarkable. Inspection of the urinary bladder revealed no diverticula, calcifications, papillary lesions.  The right ureteral orifice was cannulated with a 6-French end-hole catheter and right retrograde pyelogram was obtained.  Right retrograde pyelogram, using a 7 mL of iodinated contrast, revealed single right ureter, single system, right kidney.  No filling defects or narrowing noted.  Scout images revealed several calcifications overlying the presumed renal shadow.  A 0.038 Glidewire was advanced at the level of the upper pole and set aside as a safety wire.  Next, semi-rigid ureteroscopy was performed of the distal two-thirds of the right ureter alongside a separate Sensor working wire.  An 8-French feeding tube in the urinary bladder for pressure release, this revealed no mucosal abnormalities or calcifications of the distal ureter.  Next, semi-rigid ureteroscope was exchanged for the 12/14, 38-cm ureteral access sheath using continuous fluoroscopic vision to level of proximal ureter.  An 8-French digital ureteroscope was used to perform flexible ureteroscopy.  This scope was able to navigate approximately 2 or 3 cm proximal to the sheath, but was unable to traverse the proximal most ureter due to relative narrowing, but without focal stricture.  As such, an 8-French digital ureteroscope was exchanged for the 6-French nondigital ureteroscope, which was easily navigated all the way to the renal pelvis.  Systematic inspection was then performed.  There was  a fusiform stone at the area of the right UPJ and a separate smaller stone in the lower pole.  No additional calcifications were noted.  Given the Caliber of the ureter, holmium laser energy was applied to the stone fragmenting them into pieces, 2 mm or less in diameter.  These were then grasped with an escape basket and brought out in their entirety and  set aside for compositional analysis.  Final  examination revealed complete resolution of all stone fragments larger than 1/3rd mm and no evidence of perforation.  There was excellent hemostasis.  The access sheath was removed under continuous ureteroscopic vision and no mucosal abnormalities were found.  Given the relative narrowing of the ureter as well as bilateral nature of the procedure, it was felt that ureteral stenting was warranted.  As such, a new 6 x 24 Polaris-type stent was placed using cystoscopic and fluoroscopic guidance.  Good proximal and distal deployment were noted.  Attention was then directed to the left side.  The left ureteral orifice was cannulated with a 6-French end-hole catheter and left retrograde pyelogram seen.  Left retrograde pyelogram demonstrated a single left ureter, single system left kidney.  No filling defects or narrowing noted.  A 7 mL of iodinated contrast were used.  There was a calcification overlying the presumed renal shadow in the lower pole on scout images.  A 0.038 Glidewire was advanced at the level of the upper pole, set aside as a safety wire.  Next, semi-rigid ureteroscopy was performed in the entire length of the left ureter alongside a separate Sensor working wire and feeding tube in urinary bladder for pressure release.  This revealed no mucosal abnormalities or calcifications of the ureter.  The semi-rigid scope was then exchanged for the 12/14 38-cm ureteral access sheath using continuous fluoroscopic vision.  Next, flexible ureteroscopy was performed using a 6-French nondigital ureteroscope.  This allowed panendoscopy of the left kidney.  This revealed a single dominant calcifications in lower pole consistent with known stone on the CT.  It did appear to be too large for simple basketing.  As such, holmium laser energy was applied breaking the stone into fragments, approximately 2 mm in diameter.  These were then sequentially  grasped, brought out in their entirety, set aside for compositional analysis.  Repeat endoscopy revealed complete resolution of all stone fragments larger than 1/3rd mm.  There was an excellent hemostasis.  There was no evidence of perforation.  The sheath was removed under continuous ureteroscopic vision.  No mucosal abnormalities were detected.  Finally, a new 6 x 24 Polaris stent was placed on the left side using cystoscopic and fluoroscopic guidance.  Good proximal and distal deployment was noted. Bladder was emptied per cystoscope.  Procedure was then terminated.  The patient tolerated the procedure well.  There were no immediate periprocedural complications.  The patient was taken to postanesthesia care unit in stable condition.          ______________________________ Sebastian Ache, MD     TM/MEDQ  D:  06/18/2013  T:  06/19/2013  Job:  [id number]

## 2013-07-01 ENCOUNTER — Other Ambulatory Visit: Payer: Self-pay | Admitting: Urology

## 2013-07-01 ENCOUNTER — Encounter (HOSPITAL_BASED_OUTPATIENT_CLINIC_OR_DEPARTMENT_OTHER): Payer: Self-pay | Admitting: *Deleted

## 2013-07-01 NOTE — Progress Notes (Signed)
Npo after mn with exception sips of water w/ lipitor and prilosec. Arrive at [facility] [employer]. Needs istat. Current ekg in epic and chart.

## 2013-07-06 ENCOUNTER — Encounter (HOSPITAL_BASED_OUTPATIENT_CLINIC_OR_DEPARTMENT_OTHER): Payer: Self-pay | Admitting: Anesthesiology

## 2013-07-06 NOTE — Anesthesia Preprocedure Evaluation (Signed)
Anesthesia Evaluation  Patient identified by MRN, date of birth, ID band Patient awake    Reviewed: Allergy & Precautions, H&P , NPO status , Patient's Chart, lab work & pertinent test results  Airway Mallampati: II TM Distance: >3 FB Neck ROM: Full    Dental no notable dental hx.    Pulmonary neg pulmonary ROS,  breath sounds clear to auscultation  Pulmonary exam normal       Cardiovascular Exercise Tolerance: Good + dysrhythmias Rhythm:Regular Rate:Normal  ECG reviewed: RBBB   Neuro/Psych Anxiety negative neurological ROS     GI/Hepatic Neg liver ROS, GERD-  Medicated,  Endo/Other  negative endocrine ROS  Renal/GU negative Renal ROS  negative genitourinary   Musculoskeletal negative musculoskeletal ROS (+)   Abdominal   Peds negative pediatric ROS (+)  Hematology negative hematology ROS (+)   Anesthesia Other Findings   Reproductive/Obstetrics negative OB ROS                           Anesthesia Physical Anesthesia Plan  ASA: II  Anesthesia Plan: General   Post-op Pain Management:    Induction: Intravenous  Airway Management Planned: LMA  Additional Equipment:   Intra-op Plan:   Post-operative Plan: Extubation in OR  Informed Consent: I have reviewed the patients History and Physical, chart, labs and discussed the procedure including the risks, benefits and alternatives for the proposed anesthesia with the patient or authorized representative who has indicated his/her understanding and acceptance.   Dental advisory given  Plan Discussed with: CRNA  Anesthesia Plan Comments: (LMA #5 on 06-18-13)        Anesthesia Quick Evaluation

## 2013-07-07 ENCOUNTER — Encounter (HOSPITAL_BASED_OUTPATIENT_CLINIC_OR_DEPARTMENT_OTHER)
Admission: RE | Disposition: A | Discharge status: Home / Self Care | Payer: Self-pay | Source: Ambulatory Visit | Attending: Urology

## 2013-07-07 ENCOUNTER — Encounter (HOSPITAL_BASED_OUTPATIENT_CLINIC_OR_DEPARTMENT_OTHER): Payer: BC Managed Care – PPO | Admitting: Anesthesiology

## 2013-07-07 ENCOUNTER — Encounter (HOSPITAL_BASED_OUTPATIENT_CLINIC_OR_DEPARTMENT_OTHER): Payer: Self-pay | Admitting: Anesthesiology

## 2013-07-07 ENCOUNTER — Ambulatory Visit (HOSPITAL_BASED_OUTPATIENT_CLINIC_OR_DEPARTMENT_OTHER)
Admission: RE | Admit: 2013-07-07 | Discharge: 2013-07-07 | Disposition: A | Discharge status: Home / Self Care | Payer: BC Managed Care – PPO | Source: Ambulatory Visit | Attending: Urology | Admitting: Urology

## 2013-07-07 ENCOUNTER — Ambulatory Visit (HOSPITAL_BASED_OUTPATIENT_CLINIC_OR_DEPARTMENT_OTHER): Payer: BC Managed Care – PPO | Admitting: Anesthesiology

## 2013-07-07 DIAGNOSIS — Z466 Encounter for fitting and adjustment of urinary device: Secondary | ICD-10-CM | POA: Insufficient documentation

## 2013-07-07 DIAGNOSIS — E785 Hyperlipidemia, unspecified: Secondary | ICD-10-CM | POA: Insufficient documentation

## 2013-07-07 DIAGNOSIS — K219 Gastro-esophageal reflux disease without esophagitis: Secondary | ICD-10-CM | POA: Insufficient documentation

## 2013-07-07 HISTORY — PX: CYSTOSCOPY W/ URETERAL STENT REMOVAL: SHX1430

## 2013-07-07 LAB — POCT I-STAT, CHEM 8
BUN: 15 mg/dL (ref 6–23)
Calcium, Ion: 1.25 mmol/L — ABNORMAL HIGH (ref 1.12–1.23)
Chloride: 106 mEq/L (ref 96–112)
Glucose, Bld: 101 mg/dL — ABNORMAL HIGH (ref 70–99)
HCT: 45 % (ref 39.0–52.0)
Hemoglobin: 15.3 g/dL (ref 13.0–17.0)
Potassium: 3.7 mEq/L (ref 3.5–5.1)

## 2013-07-07 SURGERY — REMOVAL, STENT, URETER, CYSTOSCOPIC
Anesthesia: General | Site: Ureter | Laterality: Bilateral

## 2013-07-07 MED ORDER — SODIUM CHLORIDE 0.9 % IR SOLN
Status: DC | PRN
  Administered 2013-07-07: 1000 mL

## 2013-07-07 MED ORDER — OXYCODONE-ACETAMINOPHEN 7.5-325 MG PO TABS: 1.0000 | ORAL_TABLET | ORAL | Status: DC | PRN

## 2013-07-07 MED ORDER — FENTANYL CITRATE 0.05 MG/ML IJ SOLN
25.0000 ug | INTRAMUSCULAR | Status: DC | PRN
  Filled 2013-07-07: qty 1

## 2013-07-07 MED ORDER — BELLADONNA ALKALOIDS-OPIUM 16.2-60 MG RE SUPP
RECTAL | Status: AC
  Filled 2013-07-07: qty 1

## 2013-07-07 MED ORDER — ONDANSETRON HCL 4 MG/2ML IJ SOLN
INTRAMUSCULAR | Status: DC | PRN
  Administered 2013-07-07: 4 mg via INTRAVENOUS

## 2013-07-07 MED ORDER — MIDAZOLAM HCL 5 MG/5ML IJ SOLN
INTRAMUSCULAR | Status: DC | PRN
  Administered 2013-07-07: 1 mg via INTRAVENOUS

## 2013-07-07 MED ORDER — MIDAZOLAM HCL 2 MG/2ML IJ SOLN
INTRAMUSCULAR | Status: AC
  Filled 2013-07-07: qty 2

## 2013-07-07 MED ORDER — PROMETHAZINE HCL 25 MG/ML IJ SOLN
6.2500 mg | INTRAMUSCULAR | Status: DC | PRN
  Filled 2013-07-07: qty 1

## 2013-07-07 MED ORDER — LIDOCAINE HCL (CARDIAC) 20 MG/ML IV SOLN
INTRAVENOUS | Status: DC | PRN
  Administered 2013-07-07: 100 mg via INTRAVENOUS

## 2013-07-07 MED ORDER — PROPOFOL 10 MG/ML IV BOLUS
INTRAVENOUS | Status: DC | PRN
  Administered 2013-07-07: 200 mg via INTRAVENOUS

## 2013-07-07 MED ORDER — GENTAMICIN SULFATE 40 MG/ML IJ SOLN
5.0000 mg/kg | Freq: Once | INTRAVENOUS | Status: AC
  Administered 2013-07-07: 450 mg via INTRAVENOUS
  Filled 2013-07-07: qty 11.25

## 2013-07-07 MED ORDER — FENTANYL CITRATE 0.05 MG/ML IJ SOLN
INTRAMUSCULAR | Status: DC | PRN
Start: 2013-07-07 — End: 2013-07-07
  Administered 2013-07-07: 50 ug via INTRAVENOUS

## 2013-07-07 MED ORDER — FENTANYL CITRATE 0.05 MG/ML IJ SOLN
INTRAMUSCULAR | Status: AC
  Filled 2013-07-07: qty 2

## 2013-07-07 MED ORDER — KETOROLAC TROMETHAMINE 30 MG/ML IJ SOLN
INTRAMUSCULAR | Status: DC | PRN
  Administered 2013-07-07: 30 mg via INTRAVENOUS

## 2013-07-07 MED ORDER — DEXAMETHASONE SODIUM PHOSPHATE 4 MG/ML IJ SOLN
INTRAMUSCULAR | Status: DC | PRN
  Administered 2013-07-07: 10 mg via INTRAVENOUS

## 2013-07-07 MED ORDER — LACTATED RINGERS IV SOLN
INTRAVENOUS | Status: DC
  Administered 2013-07-07: 07:00:00 via INTRAVENOUS
  Filled 2013-07-07: qty 1000

## 2013-07-07 SURGICAL SUPPLY — 21 items
BAG URO CATCHER STRL LF (DRAPE) ×2 IMPLANT
BASKET LASER NITINOL 1.9FR (BASKET) ×2 IMPLANT
BASKET ZERO TIP NITINOL 2.4FR (BASKET) IMPLANT
BSKT STON RTRVL 120 1.9FR (BASKET) ×1
BSKT STON RTRVL ZERO TP 2.4FR (BASKET)
CATH INTERMIT  6FR 70CM (CATHETERS) IMPLANT
CLOTH BEACON ORANGE TIMEOUT ST (SAFETY) ×2 IMPLANT
DRAPE CAMERA CLOSED 9X96 (DRAPES) ×2 IMPLANT
GLOVE BIO SURGEON STRL SZ 6.5 (GLOVE) ×1 IMPLANT
GLOVE BIO SURGEON STRL SZ7.5 (GLOVE) ×2 IMPLANT
GLOVE INDICATOR 7.0 STRL GRN (GLOVE) ×1 IMPLANT
GOWN PREVENTION PLUS XLARGE (GOWN DISPOSABLE) ×2 IMPLANT
GOWN STRL NON-REIN LRG LVL3 (GOWN DISPOSABLE) ×3 IMPLANT
GUIDEWIRE ANG ZIPWIRE 038X150 (WIRE) ×2 IMPLANT
GUIDEWIRE STR DUAL SENSOR (WIRE) ×1 IMPLANT
IV NS 1000ML (IV SOLUTION) ×2
IV NS 1000ML BAXH (IV SOLUTION) IMPLANT
IV NS IRRIG 3000ML ARTHROMATIC (IV SOLUTION) ×1 IMPLANT
PACK CYSTOSCOPY (CUSTOM PROCEDURE TRAY) ×2 IMPLANT
SYRINGE 10CC LL (SYRINGE) ×2 IMPLANT
TUBE FEEDING 8FR 16IN STR KANG (MISCELLANEOUS) IMPLANT

## 2013-07-07 NOTE — Transfer of Care (Signed)
Immediate Anesthesia Transfer of Care Note  Patient: [name] [name]  Procedure(s) Performed: Procedure(s): CYSTOSCOPY WITH STENT REMOVAL (Bilateral)  Patient Location: PACU  Anesthesia Type:General  Level of Consciousness: sedated  Airway & Oxygen Therapy: Patient Spontanous Breathing and Patient connected to nasal cannula oxygen  Post-op Assessment: Report given to PACU RN  Post vital signs: Reviewed and stable  Complications: No apparent anesthesia complications

## 2013-07-07 NOTE — H&P (Signed)
[name] [name] is an 57 y.o. male.    Chief Complaint: Pre-Op Cysto, Bilateral Ureteral Stent Removal  HPI:   1 -  Nephrolithiasis / Retained Ureteral Stents - h/o CaOx stones treated with medical therapy, SWL, URS previously. No medical evaluation thus far.    05/2013 - CT - RT 6mm UPJ + 3mm lower + Lt 3mm lower pole sotnes, mild right hydro --> Bilateral ureteroscopic stone manipulation 06/18/2013 with bilateral internal stents  PMH sig for TNA, SWL, Ureteroscopy. No CV disease. No strong blood thinners.   Today [name] [name] is seen for elective ourpatient cysto and stent removal under anesthesia. He has very terrible experience previously with office cystoscopic removal and cannot tolerate that approach. No interval fevers. Most recent UCX negative.   Past Medical History  Diagnosis Date  . Hyperlipidemia   . GERD (gastroesophageal reflux disease)   . Anxiety   . History of kidney stones   . RBBB     NOTED IN 2006  . Arthritis   . Wears glasses     Past Surgical History  Procedure Laterality Date  . Upper gastrointestinal endoscopy  2011   GERD  . Extracorporeal shock wave lithotripsy Left JUNE 2009  . Left ureteroscopic stone extraction  04-04-2008  . Colonoscopy  2007  POLYPECTOMY/   2012  NEGATIVE  . Cardiovascular stress test  09-06-2007  DR [facility]    NORMAL NUCLEAR STUDY/  NO ISCHEMIA/  EF 53%  . Knee arthroscopy w/ meniscal repair Right MAY 2014  . Tonsillectomy and adenoidectomy  AS CHILD  . Cystoscopy with retrograde pyelogram, ureteroscopy and stent placement Bilateral 06/18/2013    Procedure: CYSTOSCOPY WITH RETROGRADE PYELOGRAM, URETEROSCOPY AND STENT PLACEMENT;  Surgeon: Sebastian Ache, MD;  Location: [facility];  Service: Urology;  Laterality: Bilateral;  . Holmium laser application Bilateral 06/18/2013    Procedure: HOLMIUM LASER APPLICATION;  Surgeon: Sebastian Ache, MD;  Location: [facility];  Service: Urology;  Laterality:  Bilateral;    Family History  Problem Relation Age of Onset  . Hypertension Mother   . Cancer Mother     breast & skin  . Dementia Mother   . Heart disease Father     MI in 107s, CBAG  . Diabetes Father   . Heart disease Maternal Grandmother     MI in 64s  . Heart disease Maternal Grandfather     Mi in 10s  . Stroke Neg Hx    Social History:  reports that he has never smoked. He has never used smokeless tobacco. He reports that he drinks about 8.4 ounces of alcohol per week. He reports that he does not use illicit drugs.  Allergies:  Allergies  Allergen Reactions  . Codeine Nausea And Vomiting    No prescriptions prior to admission    No results found for this or any previous visit (from the past 48 hour(s)). No results found.  Review of Systems  Constitutional: Negative.  Negative for fever and chills.  HENT: Negative.   Eyes: Negative.   Respiratory: Negative.   Cardiovascular: Negative.   Gastrointestinal: Negative.  Negative for nausea and vomiting.  Genitourinary: Negative.   Musculoskeletal: Negative.   Skin: Negative.   Neurological: Negative.   Endo/Heme/Allergies: Negative.   Psychiatric/Behavioral: Negative.     There were no vitals taken for this visit. Physical Exam  Constitutional: He is oriented to person, place, and time. He appears well-developed and well-nourished.  HENT:  Head: Normocephalic.  Eyes: Pupils  are equal, round, and reactive to light.  Neck: Normal range of motion. Neck supple.  Cardiovascular: Normal rate and regular rhythm.   Respiratory: Effort normal and breath sounds normal.  GI: Soft. Bowel sounds are normal.  Genitourinary: Penis normal.  Musculoskeletal: Normal range of motion.  Neurological: He is alert and oriented to person, place, and time.  Skin: Skin is warm and dry.  Psychiatric: He has a normal mood and affect. His behavior is normal. Judgment and thought content normal.     Assessment/Plan  1 -   Nephrolithiasis / Retained Ureteral Stents - Proceed with cystoscopic removal of retained stents today as he is now clinically stone free and sufficient time has passed for any peri-procedural ureteral edema to have likely subsided.    We discussed risks including bleeding, infection, damage to kidney / ureter  bladder, rarely loss of kidney. We discussed anesthetic risks and rare but serious surgical complications including DVT, PE, MI, and mortality. Pt voiced understanding and wants to proceed today.   [name] [name] 07/07/2013, 6:01 AM

## 2013-07-07 NOTE — Anesthesia Postprocedure Evaluation (Signed)
  Anesthesia Post-op Note  Patient: [name] [name]  Procedure(s) Performed: Procedure(s) (LRB): CYSTOSCOPY WITH STENT REMOVAL (Bilateral)  Patient Location: PACU  Anesthesia Type: General  Level of Consciousness: awake and alert   Airway and Oxygen Therapy: Patient Spontanous Breathing  Post-op Pain: mild  Post-op Assessment: Post-op Vital signs reviewed, Patient's Cardiovascular Status Stable, Respiratory Function Stable, Patent Airway and No signs of Nausea or vomiting  Last Vitals:  Filed Vitals:   07/07/13 0830  BP: 117/72  Pulse: 75  Temp:   Resp: 19    Post-op Vital Signs: stable   Complications: No apparent anesthesia complications

## 2013-07-07 NOTE — Anesthesia Procedure Notes (Signed)
Procedure Name: LMA Insertion Date/Time: 07/07/2013 7:47 AM Performed by: Maris Berger T Pre-anesthesia Checklist: Patient identified, Emergency Drugs available, Suction available and Patient being monitored Patient Re-evaluated:Patient Re-evaluated prior to inductionOxygen Delivery Method: Circle System Utilized Preoxygenation: Pre-oxygenation with 100% oxygen Intubation Type: IV induction Ventilation: Mask ventilation without difficulty LMA: LMA inserted LMA Size: 5.0 Number of attempts: 1 Airway Equipment and Method: bite block Placement Confirmation: positive ETCO2 Dental Injury: Teeth and Oropharynx as per pre-operative assessment

## 2013-07-07 NOTE — Brief Op Note (Signed)
07/07/2013  7:57 AM  PATIENT:  Brita Romp  57 y.o. male  PRE-OPERATIVE DIAGNOSIS:  RETAINED BILATERAL URETERAL STENTS  POST-OPERATIVE DIAGNOSIS:  RETAINED BILATERAL URETERAL STENTS  PROCEDURE:  Procedure(s): CYSTOSCOPY WITH STENT REMOVAL (Bilateral)  SURGEON:  Surgeon(s) and Role:    * Sebastian Ache, MD - Primary  PHYSICIAN ASSISTANT:   ASSISTANTS: none   ANESTHESIA:   general  EBL:  Total I/O In: 100 [I.V.:100] Out: -   BLOOD ADMINISTERED:none  DRAINS: none   LOCAL MEDICATIONS USED:  NONE  SPECIMEN:  No Specimen  DISPOSITION OF SPECIMEN:  N/A  COUNTS:  YES  TOURNIQUET:  * No tourniquets in log *  DICTATION: .Other Dictation: Dictation Number [id number]  PLAN OF CARE: Discharge to home after PACU  PATIENT DISPOSITION:  PACU - hemodynamically stable.   Delay start of Pharmacological VTE agent (>24hrs) due to surgical blood loss or risk of bleeding: not applicable

## 2013-07-08 ENCOUNTER — Encounter (HOSPITAL_BASED_OUTPATIENT_CLINIC_OR_DEPARTMENT_OTHER): Payer: Self-pay | Admitting: Urology

## 2013-07-08 NOTE — Op Note (Signed)
NAME:  [name], [name] NO.:  [id number]  MEDICAL RECORD NO.:  [id number]  LOCATION:                                 FACILITY:  PHYSICIAN:  Sebastian Ache, MD     DATE OF BIRTH:  1956/04/27  DATE OF PROCEDURE: 07/07/2013 DATE OF DISCHARGE:  07/07/2013                              OPERATIVE REPORT   DIAGNOSIS:  Retained bilateral ureteral stents.  PROCEDURE:  Cystoscopy with bilateral ureteral stents removal.  ESTIMATED BLOOD LOSS:  Nil.  COMPLICATIONS:  None.  SPECIMEN:  Bilateral ureteral stents for discard.  FINDINGS: 1. Unremarkable urinary bladder. 2. Bilateral ureteral stents removed completely and successfully.  INDICATIONS:  [name] is a very pleasant 58 year old gentleman with a history of recurrent nephrolithiasis.  He underwent bilateral ureteroscopic stone manipulation on June 18, 2013 for goal of stone free, which we achieved clinically and since he had a bilateral procedure, he had ureteral stents placed perioperatively to prevent functional obstruction from ureteral edema.  We had previously discussed removal of stents in office versus in the OR.  He had previously attempt at office removal several years ago, which he tolerated extremely poorly and he adamantly wished to proceed with operative removal and he understood the additional and anesthetic risks involved and wished to proceed.  Informed consent was obtained and placed in medical record.  PROCEDURE IN DETAIL:  [name] was verified. Procedure being cystoscopy with bilateral stent removal was confirmed. Procedure was carried out.  Time-out was performed.  Intravenous antibiotics administered.  General LMA anesthesia was induced.  The patient placed into a low-lithotomy position, and sterile field was created by prepping and draping the patient's penis, perineum, proximal thighs using iodine x3.  Next, cystourethroscopy was performed using a 22-French  rigid cystoscope with 12-degree offset lens.  Inspection of urinary bladder revealed no diverticula, calcifications, papular lesions.  Distal end of bilateral stents were seen in situ.  There was very mild periureteral edema as expected.  Distal end of the right stent was grasped and brought out in its entirety set aside for discard. Next, left stent was grasped, brought out in its entirety, inspected, and set aside for discard.  Final inspection revealed excellent hemostasis.  No evidence of perforation.  No evidence of retained stent.  Bladder was emptied per cystoscope.  Procedure was then terminated.  The patient tolerated the procedure well.  There were no immediate periprocedural complications.  The patient was taken to postanesthesia care unit in stable condition.          ______________________________ Sebastian Ache, MD     TM/MEDQ  D:  07/07/2013  T:  07/08/2013  Job:  [id number]

## 2013-08-23 ENCOUNTER — Other Ambulatory Visit: Payer: Self-pay | Admitting: Internal Medicine

## 2013-08-27 NOTE — Telephone Encounter (Signed)
Rx sent to the pharmacy by e-script.//AB/CMA 

## 2013-10-23 ENCOUNTER — Ambulatory Visit (INDEPENDENT_AMBULATORY_CARE_PROVIDER_SITE_OTHER): Payer: BC Managed Care – PPO | Admitting: Internal Medicine

## 2013-10-23 ENCOUNTER — Encounter: Payer: Self-pay | Admitting: Internal Medicine

## 2013-10-23 ENCOUNTER — Other Ambulatory Visit (INDEPENDENT_AMBULATORY_CARE_PROVIDER_SITE_OTHER): Payer: BC Managed Care – PPO

## 2013-10-23 VITALS — BP 108/76 | HR 77 | Temp 98.1°F | Resp 13 | Ht 71.0 in | Wt 192.4 lb

## 2013-10-23 DIAGNOSIS — K219 Gastro-esophageal reflux disease without esophagitis: Secondary | ICD-10-CM

## 2013-10-23 DIAGNOSIS — Z0000 Encounter for general adult medical examination without abnormal findings: Secondary | ICD-10-CM

## 2013-10-23 DIAGNOSIS — E785 Hyperlipidemia, unspecified: Secondary | ICD-10-CM

## 2013-10-23 DIAGNOSIS — Z87442 Personal history of urinary calculi: Secondary | ICD-10-CM

## 2013-10-23 LAB — CBC WITH DIFFERENTIAL/PLATELET
Basophils Absolute: 0 10*3/uL (ref 0.0–0.1)
Basophils Relative: 0.6 % (ref 0.0–3.0)
EOS PCT: 4.5 % (ref 0.0–5.0)
Eosinophils Absolute: 0.2 10*3/uL (ref 0.0–0.7)
HCT: 44.8 % (ref 39.0–52.0)
HEMOGLOBIN: 15.3 g/dL (ref 13.0–17.0)
Lymphocytes Relative: 26.4 % (ref 12.0–46.0)
Lymphs Abs: 1.4 10*3/uL (ref 0.7–4.0)
MCHC: 34.1 g/dL (ref 30.0–36.0)
MCV: 88.7 fl (ref 78.0–100.0)
Monocytes Absolute: 0.6 10*3/uL (ref 0.1–1.0)
Monocytes Relative: 11.7 % (ref 3.0–12.0)
NEUTROS ABS: 3 10*3/uL (ref 1.4–7.7)
NEUTROS PCT: 56.8 % (ref 43.0–77.0)
Platelets: 242 10*3/uL (ref 150.0–400.0)
RBC: 5.05 Mil/uL (ref 4.22–5.81)
RDW: 13.3 % (ref 11.5–14.6)
WBC: 5.3 10*3/uL (ref 4.5–10.5)

## 2013-10-23 LAB — LIPID PANEL
CHOL/HDL RATIO: 3
Cholesterol: 173 mg/dL (ref 0–200)
HDL: 63.6 mg/dL (ref >39.00)
LDL Cholesterol: 99 mg/dL (ref 0–99)
Triglycerides: 53 mg/dL (ref 0.0–149.0)
VLDL: 10.6 mg/dL (ref 0.0–40.0)

## 2013-10-23 LAB — BASIC METABOLIC PANEL
BUN: 16 mg/dL (ref 6–23)
CO2: 32 meq/L (ref 19–32)
CREATININE: 1 mg/dL (ref 0.4–1.5)
Calcium: 9.8 mg/dL (ref 8.4–10.5)
Chloride: 99 mEq/L (ref 96–112)
GFR: 78.72 mL/min (ref >60.00)
Glucose, Bld: 94 mg/dL (ref 70–99)
Potassium: 4.1 mEq/L (ref 3.5–5.1)
SODIUM: 137 meq/L (ref 135–145)

## 2013-10-23 LAB — TSH: TSH: 1.24 u[IU]/mL (ref 0.35–5.50)

## 2013-10-23 LAB — HEPATIC FUNCTION PANEL
ALBUMIN: 4.2 g/dL (ref 3.5–5.2)
ALK PHOS: 65 U/L (ref 39–117)
ALT: 37 U/L (ref 0–53)
AST: 33 U/L (ref 0–37)
BILIRUBIN DIRECT: 0.1 mg/dL (ref 0.0–0.3)
Total Bilirubin: 1.2 mg/dL (ref 0.3–1.2)
Total Protein: 6.6 g/dL (ref 6.0–8.3)

## 2013-10-23 LAB — PSA: PSA: 0.97 ng/mL (ref 0.10–4.00)

## 2013-10-23 NOTE — Patient Instructions (Signed)
Your next office appointment will be determined based upon review of your pending labs. Those instructions will be transmitted to you through My Chart . 

## 2013-10-23 NOTE — Progress Notes (Signed)
   Subjective:    Patient ID: [name] [name], male    DOB: 1956/05/24, 58 y.o.   MRN: [id number]  HPI  He is here for a physical;acute issues denied. A heart healthy diet is followed; exercise encompasses 45 minutes 3  times per week as CVE without symptoms.  Family history is negative for premature coronary disease. Advanced cholesterol testing reveals  LDL goal is less than 100 ; ideally < 70 . There is medication compliance with the statin.  Low dose ASA taken    Review of Systems Specifically denied are  chest pain, palpitations, dyspnea, or claudication.  Significant abdominal symptoms, memory deficit, or myalgias not present.   He deniesunexplained weight loss, abdominal pain, significant dyspepsia, dysphagia, melena, rectal bleeding, or persistently small caliber stools.     Objective:   Physical Exam  Gen.: Healthy and well-nourished in appearance. Alert, appropriate and cooperative throughout exam. Appears younger than stated age  Head: Normocephalic without obvious abnormalities; short hair cut  Eyes: No corneal or conjunctival inflammation noted. Pupils equal round reactive to light and accommodation. Extraocular motion intact.  Ears: External  ear exam reveals no significant lesions or deformities. Canals clear .TMs normal. Hearing is grossly normal bilaterally. Nose: External nasal exam reveals no deformity or inflammation. Nasal mucosa are pink and moist. No lesions or exudates noted.   Mouth: Oral mucosa and oropharynx reveal no lesions or exudates. Teeth in good repair. Neck: No deformities, masses, or tenderness noted. Range of motion & Thyroid normal Lungs: Normal respiratory effort; chest expands symmetrically. Lungs are clear to auscultation without rales, wheezes, or increased work of breathing. Heart: Normal rate and rhythm. Normal S1 and S2. No gallop, click, or rub. No murmur. Abdomen: Bowel sounds normal; abdomen soft and nontender. No masses, organomegaly or  hernias noted. Genitalia: :Genitalia normal except for left varices. Prostate is asymmetric; R lobe > L w/o nodularity or induration                                    Musculoskeletal/extremities: No deformity or scoliosis noted of  the thoracic or lumbar spine.  No clubbing, cyanosis, edema, or significant extremity  deformity noted. Range of motion normal .Tone & strength normal. Hand joints normal  Fingernail  health good. Able to lie down & sit up w/o help. Negative SLR bilaterally Vascular: Carotid, radial artery, dorsalis pedis and  posterior tibial pulses are full and equal. No bruits present. Neurologic: Alert and oriented x3. Deep tendon reflexes symmetrical and normal.  Gait normal  Skin: Intact without suspicious lesions or rashes. Lymph: No cervical, axillary, or inguinal lymphadenopathy present. Psych: Mood and affect are normal. Normally interactive                                                                                                             Assessment & Plan:  #1 comprehensive physical exam; no acute findings  Plan: see Orders  & Recommendations

## 2013-10-23 NOTE — Progress Notes (Signed)
Pre visit review using our clinic review tool, if applicable. No additional management support is needed unless otherwise documented below in the visit note. 

## 2014-01-07 ENCOUNTER — Other Ambulatory Visit: Payer: Self-pay

## 2014-01-07 MED ORDER — ATORVASTATIN CALCIUM 40 MG PO TABS: ORAL_TABLET | ORAL | Status: DC

## 2014-02-20 ENCOUNTER — Other Ambulatory Visit: Payer: Self-pay | Admitting: Internal Medicine

## 2014-06-19 ENCOUNTER — Ambulatory Visit: Payer: BC Managed Care – PPO

## 2014-06-19 ENCOUNTER — Ambulatory Visit (INDEPENDENT_AMBULATORY_CARE_PROVIDER_SITE_OTHER): Payer: BC Managed Care – PPO | Admitting: *Deleted

## 2014-06-19 DIAGNOSIS — Z23 Encounter for immunization: Secondary | ICD-10-CM

## 2014-07-09 ENCOUNTER — Ambulatory Visit (INDEPENDENT_AMBULATORY_CARE_PROVIDER_SITE_OTHER): Payer: BC Managed Care – PPO | Admitting: Internal Medicine

## 2014-07-09 ENCOUNTER — Encounter: Payer: Self-pay | Admitting: Internal Medicine

## 2014-07-09 VITALS — BP 120/82 | HR 85 | Temp 98.3°F | Ht 71.0 in | Wt 199.5 lb

## 2014-07-09 DIAGNOSIS — J209 Acute bronchitis, unspecified: Secondary | ICD-10-CM

## 2014-07-09 DIAGNOSIS — J069 Acute upper respiratory infection, unspecified: Secondary | ICD-10-CM

## 2014-07-09 MED ORDER — AMOXICILLIN 500 MG PO CAPS: 500.0000 mg | ORAL_CAPSULE | Freq: Three times a day (TID) | ORAL | Status: DC

## 2014-07-09 MED ORDER — BENZONATATE 200 MG PO CAPS: 200.0000 mg | ORAL_CAPSULE | Freq: Three times a day (TID) | ORAL | Status: DC | PRN

## 2014-07-09 NOTE — Progress Notes (Signed)
Pre visit review using our clinic review tool, if applicable. No additional management support is needed unless otherwise documented below in the visit note. 

## 2014-07-09 NOTE — Patient Instructions (Signed)

## 2014-07-09 NOTE — Progress Notes (Signed)
   Subjective:    Patient ID: [name] [name], male    DOB: May 24, 1956, 58 y.o.   MRN: [id number]  HPI  Symptoms began 07/04/14 as sore throat and minor rhinitis. As of 12/27 he developed a productive cough which has progressed  He's had associated itchy, watery eyes, and sneezing  He continues to have frontal headache and facial/maxillary sinus area pain  He did some minor pain in the teeth.  The cough is productive of clear to yellow sputum  He been exposed to his grandson who had respiratory tract symptoms  He's been using Mucinex D with only partial response  Review of Systems He denies fever, chills, sweats  Todate he's not had purulent nasal discharge  He denies otic pain or otic discharge.  The cough is not associated with wheezing or shortness of breath.      Objective:   Physical Exam General appearance:good health ;well nourished; no acute distress or increased work of breathing is present.  No  lymphadenopathy about the head, neck, or axilla noted.   Eyes: No conjunctival inflammation or lid edema is present. There is no scleral icterus.  Ears:  External ear exam shows no significant lesions or deformities.  Otoscopic examination reveals clear canals, tympanic membranes are intact bilaterally without bulging, retraction, inflammation or discharge.  Nose:  External nasal examination shows no deformity or inflammation. Nasal mucosa are pink and moist without lesions or exudates. No septal dislocation or deviation.No obstruction to airflow.   Oral exam: Dental hygiene is good; lips and gums are healthy appearing.There is no oropharyngeal erythema or exudate noted.   Neck:  No deformities, thyromegaly, masses, or tenderness noted.   Supple with full range of motion without pain.   Heart:  Normal rate and regular rhythm. S1 and S2 normal without gallop, murmur, click, rub or other extra sounds.   Lungs:Chest clear to auscultation; no wheezes, rhonchi,rales ,or  rubs present.No increased work of breathing.    Extremities:  No cyanosis, edema, or clubbing  noted    Skin: Warm & dry w/o jaundice or tenting.        Assessment & Plan:  #1 acute bronchitis w/o bronchospasm #2 URI, acute Plan: See orders and recommendations

## 2014-07-10 HISTORY — PX: SQUAMOUS CELL CARCINOMA EXCISION: SHX2433

## 2014-09-08 HISTORY — PX: RETINAL TEAR REPAIR CRYOTHERAPY: SHX5304

## 2014-09-23 ENCOUNTER — Encounter (INDEPENDENT_AMBULATORY_CARE_PROVIDER_SITE_OTHER): Payer: BLUE CROSS/BLUE SHIELD | Admitting: Ophthalmology

## 2014-09-23 DIAGNOSIS — H2513 Age-related nuclear cataract, bilateral: Secondary | ICD-10-CM | POA: Diagnosis not present

## 2014-09-23 DIAGNOSIS — H43813 Vitreous degeneration, bilateral: Secondary | ICD-10-CM

## 2014-09-23 DIAGNOSIS — H33012 Retinal detachment with single break, left eye: Secondary | ICD-10-CM

## 2014-10-01 ENCOUNTER — Ambulatory Visit (INDEPENDENT_AMBULATORY_CARE_PROVIDER_SITE_OTHER): Payer: BLUE CROSS/BLUE SHIELD | Admitting: Ophthalmology

## 2014-10-01 DIAGNOSIS — H33302 Unspecified retinal break, left eye: Secondary | ICD-10-CM

## 2014-10-05 ENCOUNTER — Encounter: Payer: Self-pay | Admitting: Internal Medicine

## 2014-10-06 ENCOUNTER — Encounter: Payer: Self-pay | Admitting: Internal Medicine

## 2014-10-06 ENCOUNTER — Other Ambulatory Visit: Payer: Self-pay | Admitting: Internal Medicine

## 2014-10-06 DIAGNOSIS — Z0000 Encounter for general adult medical examination without abnormal findings: Secondary | ICD-10-CM

## 2014-10-08 ENCOUNTER — Other Ambulatory Visit (INDEPENDENT_AMBULATORY_CARE_PROVIDER_SITE_OTHER): Payer: BLUE CROSS/BLUE SHIELD

## 2014-10-08 DIAGNOSIS — Z0189 Encounter for other specified special examinations: Secondary | ICD-10-CM | POA: Diagnosis not present

## 2014-10-08 DIAGNOSIS — Z0000 Encounter for general adult medical examination without abnormal findings: Secondary | ICD-10-CM

## 2014-10-08 LAB — BASIC METABOLIC PANEL
BUN: 20 mg/dL (ref 6–23)
CHLORIDE: 97 meq/L (ref 96–112)
CO2: 32 mEq/L (ref 19–32)
Calcium: 10 mg/dL (ref 8.4–10.5)
Creatinine, Ser: 1.11 mg/dL (ref 0.40–1.50)
GFR: 71.97 mL/min (ref >60.00)
Glucose, Bld: 91 mg/dL (ref 70–99)
Potassium: 3.9 mEq/L (ref 3.5–5.1)
Sodium: 138 mEq/L (ref 135–145)

## 2014-10-08 LAB — LIPID PANEL
CHOLESTEROL: 183 mg/dL (ref 0–200)
HDL: 54.4 mg/dL (ref >39.00)
LDL Cholesterol: 113 mg/dL — ABNORMAL HIGH (ref 0–99)
NonHDL: 128.6
TRIGLYCERIDES: 78 mg/dL (ref 0.0–149.0)
Total CHOL/HDL Ratio: 3
VLDL: 15.6 mg/dL (ref 0.0–40.0)

## 2014-10-08 LAB — CBC WITH DIFFERENTIAL/PLATELET
BASOS ABS: 0 10*3/uL (ref 0.0–0.1)
Basophils Relative: 0.3 % (ref 0.0–3.0)
EOS PCT: 2 % (ref 0.0–5.0)
Eosinophils Absolute: 0.2 10*3/uL (ref 0.0–0.7)
HCT: 46.1 % (ref 39.0–52.0)
HEMOGLOBIN: 15.9 g/dL (ref 13.0–17.0)
LYMPHS ABS: 1.7 10*3/uL (ref 0.7–4.0)
LYMPHS PCT: 20.2 % (ref 12.0–46.0)
MCHC: 34.5 g/dL (ref 30.0–36.0)
MCV: 86.8 fl (ref 78.0–100.0)
MONOS PCT: 9.3 % (ref 3.0–12.0)
Monocytes Absolute: 0.8 10*3/uL (ref 0.1–1.0)
NEUTROS ABS: 5.9 10*3/uL (ref 1.4–7.7)
Neutrophils Relative %: 68.2 % (ref 43.0–77.0)
Platelets: 259 10*3/uL (ref 150.0–400.0)
RBC: 5.31 Mil/uL (ref 4.22–5.81)
RDW: 13.1 % (ref 11.5–15.5)
WBC: 8.6 10*3/uL (ref 4.0–10.5)

## 2014-10-08 LAB — HEPATIC FUNCTION PANEL
ALT: 33 U/L (ref 0–53)
AST: 24 U/L (ref 0–37)
Albumin: 4.3 g/dL (ref 3.5–5.2)
Alkaline Phosphatase: 70 U/L (ref 39–117)
BILIRUBIN TOTAL: 0.9 mg/dL (ref 0.2–1.2)
Bilirubin, Direct: 0.2 mg/dL (ref 0.0–0.3)
Total Protein: 6.7 g/dL (ref 6.0–8.3)

## 2014-10-08 LAB — TSH: TSH: 2.43 u[IU]/mL (ref 0.35–4.50)

## 2014-10-08 LAB — PSA: PSA: 1.01 ng/mL (ref 0.10–4.00)

## 2014-10-13 ENCOUNTER — Ambulatory Visit (INDEPENDENT_AMBULATORY_CARE_PROVIDER_SITE_OTHER): Payer: BLUE CROSS/BLUE SHIELD | Admitting: Internal Medicine

## 2014-10-13 ENCOUNTER — Encounter: Payer: Self-pay | Admitting: Internal Medicine

## 2014-10-13 VITALS — BP 124/80 | HR 75 | Temp 97.9°F | Ht 71.0 in | Wt 200.8 lb

## 2014-10-13 DIAGNOSIS — Z86010 Personal history of colonic polyps: Secondary | ICD-10-CM | POA: Diagnosis not present

## 2014-10-13 DIAGNOSIS — Z0000 Encounter for general adult medical examination without abnormal findings: Secondary | ICD-10-CM | POA: Diagnosis not present

## 2014-10-13 NOTE — Progress Notes (Signed)
Pre visit review using our clinic review tool, if applicable. No additional management support is needed unless otherwise documented below in the visit note. 

## 2014-10-13 NOTE — Patient Instructions (Signed)
Please follow a Mediaterranean type diet  (many good cook books readily available) or review Dr Nunzio Cory book Eat, [place] for best  dietary cholesterol information & options.   Cardiovascular exercise, this can be as simple a program as walking, is recommended 30-45 minutes 3-4 times per week.  If you cannot make significant changes in exercise and nutrition; Crestor  at low dose would be the best therapeutic option to reduce your long term risk if not cost prohibitive.

## 2014-10-13 NOTE — Progress Notes (Signed)
Subjective:    Patient ID: [name] [name], male    DOB: 1956-01-30, 59 y.o.   MRN: [id number]  HPI  He is here for a physical;acute issues denied.  He has been compliant with his medicines without adverse effects. He does have minor muscle pain after yard work or exercising.  Advanced cholesterol testing reveals his LDL goal is less than 100, ideally less than 100. His present value of 113 is on atorvastatin 40 mg daily. He does plan to check the cost of Crestor 20 mg daily. There is no family history of premature heart attack or stroke.  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  He was evaluated by his urologist approximately 2 years ago for gross hematuria. Evaluation was negative. He is on an agent to decrease calcium load.  At this time he denies any other genitourinary or GI symptoms.   Review of Systems Significant headaches, epistaxis, chest pain, palpitations, exertional dyspnea, claudication, paroxysmal nocturnal dyspnea, or edema absent. No GI symptoms , memory loss or significant myalgias  Dysuria, pyuria, hematuria, frequency, nocturia or polyuria are denied. Unexplained weight loss, abdominal pain, significant dyspepsia, dysphagia, melena, rectal bleeding, or persistently small caliber stools are denied. He sees Dr. Erling Cruz was prescribed his bupropion, lorazepam, and zolpidem. There are no active issues.    Objective:   Physical Exam  Gen.: Adequately nourished in appearance. Alert, appropriate and cooperative throughout exam. BMI:28.01 Appears younger than stated age  Head: Normocephalic without obvious abnormalities; head shaven. alopecia  Eyes: No corneal or conjunctival inflammation noted. Pupils equal round reactive to light and accommodation. Extraocular motion intact.  Ears: External  ear exam reveals no significant lesions or deformities. Canals clear .TMs normal. Hearing is grossly normal bilaterally. Nose:  External nasal exam reveals no deformity or inflammation. Nasal mucosa are pink and moist. No lesions or exudates noted.   Mouth: Oral mucosa and oropharynx reveal no lesions or exudates. Teeth in good repair. Neck: No deformities, masses, or tenderness noted. Range of motion &. Thyroid normal. Lungs: Normal respiratory effort; chest expands symmetrically. Lungs are clear to auscultation without rales, wheezes, or increased work of breathing. Heart: Normal rate and rhythm. Normal S1 and S2. No gallop, click, or rub. No murmur. Abdomen: Bowel sounds normal; abdomen soft and nontender. No masses, organomegaly or hernias noted. Genitalia: Genitalia normal except for left varices. Prostate is normal without enlargement, asymmetry, nodularity, or induration                               Musculoskeletal/extremities: No deformity or scoliosis noted of  the thoracic or lumbar spine. No clubbing, cyanosis, edema, or significant extremity  deformity noted.  Range of motion normal . Tone & strength normal. Hand joints normal  Fingernail  health good. Crepitus of knees  Able to lie down & sit up w/o help.  Negative SLR bilaterally Vascular: Carotid, radial artery, dorsalis pedis and  posterior tibial pulses are full and equal. No bruits present. Neurologic: Alert and oriented x3. Deep tendon reflexes symmetrical and normal.  Gait normal      Skin: Intact without suspicious lesions or rashes.Op scar of nose well healed. Lymph: No cervical, axillary, or inguinal lymphadenopathy present. Psych: Mood and affect are normal. Normally interactive  Assessment & Plan:  #1 comprehensive physical exam; no acute findings  Plan: see Orders  & Recommendations

## 2014-11-16 ENCOUNTER — Other Ambulatory Visit: Payer: Self-pay | Admitting: Internal Medicine

## 2014-12-21 ENCOUNTER — Other Ambulatory Visit: Payer: Self-pay | Admitting: Internal Medicine

## 2014-12-21 NOTE — Telephone Encounter (Signed)
Atorvastatin rx sent to pharm

## 2015-02-04 ENCOUNTER — Ambulatory Visit (INDEPENDENT_AMBULATORY_CARE_PROVIDER_SITE_OTHER): Payer: BLUE CROSS/BLUE SHIELD | Admitting: Ophthalmology

## 2015-02-04 DIAGNOSIS — H33302 Unspecified retinal break, left eye: Secondary | ICD-10-CM

## 2015-02-04 DIAGNOSIS — H43813 Vitreous degeneration, bilateral: Secondary | ICD-10-CM

## 2015-04-26 ENCOUNTER — Encounter: Payer: Self-pay | Admitting: Internal Medicine

## 2015-04-29 ENCOUNTER — Ambulatory Visit (INDEPENDENT_AMBULATORY_CARE_PROVIDER_SITE_OTHER): Payer: BLUE CROSS/BLUE SHIELD

## 2015-04-29 DIAGNOSIS — Z23 Encounter for immunization: Secondary | ICD-10-CM | POA: Diagnosis not present

## 2015-07-09 ENCOUNTER — Encounter: Payer: Self-pay | Admitting: Family Medicine

## 2015-07-09 ENCOUNTER — Ambulatory Visit (INDEPENDENT_AMBULATORY_CARE_PROVIDER_SITE_OTHER): Payer: BLUE CROSS/BLUE SHIELD | Admitting: Family Medicine

## 2015-07-09 VITALS — BP 122/60 | Temp 98.7°F | Ht 71.0 in | Wt 204.0 lb

## 2015-07-09 DIAGNOSIS — J019 Acute sinusitis, unspecified: Secondary | ICD-10-CM

## 2015-07-09 MED ORDER — AMOXICILLIN-POT CLAVULANATE 875-125 MG PO TABS
1.0000 | ORAL_TABLET | Freq: Two times a day (BID) | ORAL | Status: DC
Start: 2015-07-09 — End: 2015-11-10

## 2015-07-09 NOTE — Progress Notes (Signed)
Pre visit review using our clinic review tool, if applicable. No additional management support is needed unless otherwise documented below in the visit note. 

## 2015-07-12 ENCOUNTER — Encounter: Payer: Self-pay | Admitting: Family Medicine

## 2015-07-12 NOTE — Progress Notes (Signed)
   Subjective:    Patient ID: [name] [name], male    DOB: 11/12/1955, 60 y.o.   MRN: [id number]  HPI Here for 2 weeks of stuffy head, sinus pressure, PND, and a dry cough. No fever.   Review of Systems  Constitutional: Negative.   HENT: Positive for congestion, postnasal drip and sinus pressure. Negative for ear pain and sore throat.   Eyes: Negative.   Respiratory: Positive for cough and chest tightness. Negative for shortness of breath and wheezing.   Cardiovascular: Negative.        Objective:   Physical Exam  Constitutional: He appears well-developed and well-nourished.  HENT:  Right Ear: External ear normal.  Left Ear: External ear normal.  Nose: Nose normal.  Mouth/Throat: Oropharynx is clear and moist.  Eyes: Conjunctivae are normal.  Neck: Neck supple. No thyromegaly present.  Cardiovascular: Normal rate, regular rhythm, normal heart sounds and intact distal pulses.   Pulmonary/Chest: Effort normal and breath sounds normal. No respiratory distress. He has no wheezes. He has no rales.  Lymphadenopathy:    He has no cervical adenopathy.          Assessment & Plan:  Sinusitis, treat with Augmentin.

## 2015-07-13 ENCOUNTER — Telehealth: Payer: Self-pay | Admitting: Internal Medicine

## 2015-07-13 NOTE — Telephone Encounter (Signed)
Ok with me 

## 2015-07-13 NOTE — Telephone Encounter (Signed)
Patient is requesting to be taken under your care. His wife, [name] [name], is currently your patient. Will you accept him as a patient?

## 2015-07-14 NOTE — Telephone Encounter (Signed)
Left vm for patient to call back to make appointment °

## 2015-08-03 ENCOUNTER — Other Ambulatory Visit: Payer: Self-pay | Admitting: Internal Medicine

## 2015-10-14 ENCOUNTER — Encounter: Payer: BLUE CROSS/BLUE SHIELD | Admitting: Internal Medicine

## 2015-10-31 ENCOUNTER — Other Ambulatory Visit: Payer: Self-pay | Admitting: Internal Medicine

## 2015-11-10 ENCOUNTER — Other Ambulatory Visit (INDEPENDENT_AMBULATORY_CARE_PROVIDER_SITE_OTHER): Payer: BLUE CROSS/BLUE SHIELD

## 2015-11-10 ENCOUNTER — Encounter: Payer: Self-pay | Admitting: Internal Medicine

## 2015-11-10 ENCOUNTER — Ambulatory Visit (INDEPENDENT_AMBULATORY_CARE_PROVIDER_SITE_OTHER): Payer: BLUE CROSS/BLUE SHIELD | Admitting: Internal Medicine

## 2015-11-10 VITALS — BP 126/78 | HR 66 | Temp 97.8°F | Resp 20 | Wt 175.0 lb

## 2015-11-10 DIAGNOSIS — Z0000 Encounter for general adult medical examination without abnormal findings: Secondary | ICD-10-CM

## 2015-11-10 DIAGNOSIS — Z0001 Encounter for general adult medical examination with abnormal findings: Secondary | ICD-10-CM | POA: Insufficient documentation

## 2015-11-10 DIAGNOSIS — Z1159 Encounter for screening for other viral diseases: Secondary | ICD-10-CM

## 2015-11-10 LAB — CBC WITH DIFFERENTIAL/PLATELET
BASOS ABS: 0 10*3/uL (ref 0.0–0.1)
BASOS PCT: 0.5 % (ref 0.0–3.0)
EOS ABS: 0.1 10*3/uL (ref 0.0–0.7)
Eosinophils Relative: 1.8 % (ref 0.0–5.0)
HEMATOCRIT: 45 % (ref 39.0–52.0)
HEMOGLOBIN: 15.3 g/dL (ref 13.0–17.0)
LYMPHS PCT: 24.7 % (ref 12.0–46.0)
Lymphs Abs: 1.5 10*3/uL (ref 0.7–4.0)
MCHC: 34 g/dL (ref 30.0–36.0)
MCV: 88.1 fl (ref 78.0–100.0)
Monocytes Absolute: 0.7 10*3/uL (ref 0.1–1.0)
Monocytes Relative: 11.2 % (ref 3.0–12.0)
Neutro Abs: 3.7 10*3/uL (ref 1.4–7.7)
Neutrophils Relative %: 61.8 % (ref 43.0–77.0)
Platelets: 239 10*3/uL (ref 150.0–400.0)
RBC: 5.1 Mil/uL (ref 4.22–5.81)
RDW: 13.4 % (ref 11.5–15.5)
WBC: 6 10*3/uL (ref 4.0–10.5)

## 2015-11-10 LAB — URINALYSIS, ROUTINE W REFLEX MICROSCOPIC
BILIRUBIN URINE: NEGATIVE
HGB URINE DIPSTICK: NEGATIVE
LEUKOCYTES UA: NEGATIVE
NITRITE: NEGATIVE
RBC / HPF: NONE SEEN (ref 0–?)
Specific Gravity, Urine: 1.01 (ref 1.000–1.030)
TOTAL PROTEIN, URINE-UPE24: NEGATIVE
URINE GLUCOSE: NEGATIVE
UROBILINOGEN UA: 1 (ref 0.0–1.0)
pH: 7 (ref 5.0–8.0)

## 2015-11-10 LAB — HEPATIC FUNCTION PANEL
ALT: 27 U/L (ref 0–53)
AST: 24 U/L (ref 0–37)
Albumin: 4.5 g/dL (ref 3.5–5.2)
Alkaline Phosphatase: 64 U/L (ref 39–117)
Bilirubin, Direct: 0.1 mg/dL (ref 0.0–0.3)
TOTAL PROTEIN: 6.8 g/dL (ref 6.0–8.3)
Total Bilirubin: 0.6 mg/dL (ref 0.2–1.2)

## 2015-11-10 LAB — LIPID PANEL
CHOLESTEROL: 187 mg/dL (ref 0–200)
HDL: 57.2 mg/dL (ref >39.00)
LDL Cholesterol: 113 mg/dL — ABNORMAL HIGH (ref 0–99)
NonHDL: 130.17
TRIGLYCERIDES: 87 mg/dL (ref 0.0–149.0)
Total CHOL/HDL Ratio: 3
VLDL: 17.4 mg/dL (ref 0.0–40.0)

## 2015-11-10 LAB — PSA: PSA: 0.68 ng/mL (ref 0.10–4.00)

## 2015-11-10 LAB — BASIC METABOLIC PANEL
BUN: 23 mg/dL (ref 6–23)
CHLORIDE: 96 meq/L (ref 96–112)
CO2: 34 mEq/L — ABNORMAL HIGH (ref 19–32)
CREATININE: 0.93 mg/dL (ref 0.40–1.50)
Calcium: 9.8 mg/dL (ref 8.4–10.5)
GFR: 87.95 mL/min (ref >60.00)
Glucose, Bld: 92 mg/dL (ref 70–99)
Potassium: 4.1 mEq/L (ref 3.5–5.1)
Sodium: 138 mEq/L (ref 135–145)

## 2015-11-10 LAB — TSH: TSH: 1.06 u[IU]/mL (ref 0.35–4.50)

## 2015-11-10 MED ORDER — ROSUVASTATIN CALCIUM 20 MG PO TABS: 20.0000 mg | ORAL_TABLET | Freq: Every day | ORAL | Status: DC

## 2015-11-10 NOTE — Assessment & Plan Note (Signed)

## 2015-11-10 NOTE — Progress Notes (Signed)
Pre visit review using our clinic review tool, if applicable. No additional management support is needed unless otherwise documented below in the visit note. 

## 2015-11-10 NOTE — Progress Notes (Addendum)
Subjective:    Patient ID: [name] [name], male    DOB: Mar 07, 1956, 60 y.o.   MRN: [id number]  HPI  Here for wellness and f/u;  Overall doing ok;  Pt denies Chest pain, worsening SOB, DOE, wheezing, orthopnea, PND, worsening LE edema, palpitations, dizziness or syncope.  Pt denies neurological change such as new headache, facial or extremity weakness.  Pt denies polydipsia, polyuria, or low sugar symptoms. Pt states overall good compliance with treatment and medications, good tolerability, and has been trying to follow appropriate diet.  Pt denies worsening depressive symptoms, suicidal ideation or panic. No fever, night sweats, loss of appetite, or other constitutional symptoms.  Pt states good ability with ADL's, has low fall risk, home safety reviewed and adequate, no other significant changes in hearing or vision, and regualry active with exercise.  Has lost wt intentionally with better exercise and diet Wt Readings from Last 3 Encounters:  11/10/15 175 lb (79.379 kg)  07/09/15 204 lb (92.534 kg)  10/13/14 200 lb 12 oz (91.06 kg)  Dr manny/urology - sees about every febfor renal stones, on indapamide to help prevent further stones. Past Medical History  Diagnosis Date  . Hyperlipidemia   . GERD (gastroesophageal reflux disease)   . Anxiety   . History of kidney stones   . RBBB     NOTED IN 2006   Past Surgical History  Procedure Laterality Date  . Upper gastrointestinal endoscopy  2011   GERD  . Extracorporeal shock wave lithotripsy Left JUNE 2009  . Left ureteroscopic stone extraction  04-04-2008  . Colonoscopy  2007  POLYPECTOMY/   2012  NEGATIVE    neg 07/06/2099  . Cardiovascular stress test  09-06-2007  DR [facility] [place]    NORMAL NUCLEAR STUDY/  NO ISCHEMIA/  EF 53%  . Knee arthroscopy w/ meniscal repair Right MAY 2014  . Tonsillectomy and adenoidectomy  AS CHILD  . Cystoscopy with retrograde pyelogram, ureteroscopy and stent placement Bilateral 06/18/2013    Procedure:  [place], URETEROSCOPY AND STENT PLACEMENT;  Surgeon: Alexis Frock, MD;  Location: [facility];  Service: Urology;  Laterality: Bilateral;  . Holmium laser application Bilateral [id number]    Procedure: HOLMIUM LASER APPLICATION;  Surgeon: Alexis Frock, MD;  Location: [facility];  Service: Urology;  Laterality: Bilateral;  . Cystoscopy w/ ureteral stent removal Bilateral 07/07/2013    Procedure: CYSTOSCOPY WITH STENT REMOVAL;  Surgeon: Alexis Frock, MD;  Location: [facility];  Service: Urology;  Laterality: Bilateral;  . Retinal tear repair cryotherapy Left 09/2014    Repaired by laser surgery  . 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 He has never used smokeless tobacco. He reports that he drinks about 8.4 oz of alcohol per week. He reports that he does not use illicit drugs. family history includes Cancer in his mother; Dementia in his mother; Diabetes in his father; Heart disease in his father, maternal grandfather, and maternal grandmother; Hypertension in his mother. There is no history of Stroke. Allergies  Allergen Reactions  . Codeine Nausea And Vomiting   Current Outpatient Prescriptions on File Prior to Visit  Medication Sig Dispense Refill  . aspirin 81 MG tablet Take 81 mg by mouth daily.    Marland Kitchen buPROPion (WELLBUTRIN XL) 300 MG 24 hr tablet Take 300 mg by mouth daily.    . indapamide (LOZOL)  2.5 MG tablet Take 2.5 mg by mouth daily.    Marland Kitchen LORazepam (ATIVAN) 1 MG tablet Take 0.5-1 mg by mouth every 8 (eight) hours.    Marland Kitchen omeprazole (PRILOSEC) 20 MG capsule TAKE 1 CAPSULE BY MOUTH DAILY 90 capsule 0  . zolpidem (AMBIEN) 10 MG tablet Take 10 mg by mouth at bedtime as needed for sleep.     No current facility-administered medications on file prior to visit.    Review of Systems Constitutional: Negative for increased  diaphoresis, or other activity, appetite or siginficant weight change other than noted HENT: Negative for worsening hearing loss, ear pain, facial swelling, mouth sores and neck stiffness.   Eyes: Negative for other worsening pain, redness or visual disturbance.  Respiratory: Negative for choking or stridor Cardiovascular: Negative for other chest pain and palpitations.  Gastrointestinal: Negative for worsening diarrhea, blood in stool, or abdominal distention Genitourinary: Negative for hematuria, flank pain or change in urine volume.  Musculoskeletal: Negative for myalgias or other joint complaints.  Skin: Negative for other color change and wound or drainage.  Neurological: Negative for syncope and numbness. other than noted Hematological: Negative for adenopathy. or other swelling Psychiatric/Behavioral: Negative for hallucinations, SI, self-injury, decreased concentration or other worsening agitation.      Objective:   Physical Exam BP 126/78 mmHg  Pulse 66  Temp(Src) 97.8 F (36.6 C) (Oral)  Resp 20  Wt 175 lb (79.379 kg)  SpO2 98% VS noted,  Constitutional: Pt is oriented to person, place, and time. Appears well-developed and well-nourished, in no significant distress Head: Normocephalic and atraumatic  Eyes: Conjunctivae and EOM are normal. Pupils are equal, round, and reactive to light Right Ear: External ear normal.  Left Ear: External ear normal Nose: Nose normal.  Mouth/Throat: Oropharynx is clear and moist  Neck: Normal range of motion. Neck supple. No JVD present. No tracheal deviation present or significant neck LA or mass Cardiovascular: Normal rate, regular rhythm, normal heart sounds and intact distal pulses.   Pulmonary/Chest: Effort normal and breath sounds without rales or wheezing  Abdominal: Soft. Bowel sounds are normal. NT. No HSM  Musculoskeletal: Normal range of motion. Exhibits no edema Lymphadenopathy: Has no cervical adenopathy.  Neurological: Pt is  alert and oriented to person, place, and time. Pt has normal reflexes. No cranial nerve deficit. Motor grossly intact Skin: Skin is warm and dry. No rash noted or new ulcers Psychiatric:  Has normal mood and affect. Behavior is normal.   ECG reviewed: Sinus  Rhythm  -Right bundle branch block.   ABNORMAL      Assessment & Plan:

## 2015-11-10 NOTE — Patient Instructions (Signed)

## 2015-11-11 LAB — HEPATITIS C ANTIBODY: HCV Ab: NEGATIVE

## 2015-11-23 ENCOUNTER — Other Ambulatory Visit: Payer: Self-pay | Admitting: Internal Medicine

## 2015-11-25 ENCOUNTER — Telehealth: Payer: Self-pay

## 2015-11-25 NOTE — Telephone Encounter (Signed)
I did not complete a health screening form for this patient

## 2015-11-25 NOTE — Telephone Encounter (Signed)
Patient called about a Health provider scanning form that was filled out and sent to his insurance. He states they say that there is things missing from it or they can not read it. He wanted to know if you all could look at it and see what is missing and please send it again. PLease follow up. Thank you. Also he would like a phone call when it is done.

## 2015-11-25 NOTE — Telephone Encounter (Signed)
Called patient, unable to reach. Left message to give us a call back.  

## 2015-12-02 ENCOUNTER — Encounter: Payer: Self-pay | Admitting: Internal Medicine

## 2015-12-13 ENCOUNTER — Other Ambulatory Visit: Payer: Self-pay | Admitting: *Deleted

## 2015-12-13 MED ORDER — OMEPRAZOLE 20 MG PO CPDR: 20.0000 mg | DELAYED_RELEASE_CAPSULE | Freq: Every day | ORAL | Status: DC

## 2015-12-13 MED ORDER — ROSUVASTATIN CALCIUM 20 MG PO TABS: 20.0000 mg | ORAL_TABLET | Freq: Every day | ORAL | Status: DC

## 2016-05-19 ENCOUNTER — Other Ambulatory Visit: Payer: Self-pay | Admitting: Internal Medicine

## 2016-05-25 ENCOUNTER — Ambulatory Visit (INDEPENDENT_AMBULATORY_CARE_PROVIDER_SITE_OTHER): Payer: BLUE CROSS/BLUE SHIELD

## 2016-05-25 DIAGNOSIS — Z23 Encounter for immunization: Secondary | ICD-10-CM | POA: Diagnosis not present

## 2016-06-21 ENCOUNTER — Encounter: Payer: Self-pay | Admitting: Internal Medicine

## 2016-06-21 ENCOUNTER — Ambulatory Visit (INDEPENDENT_AMBULATORY_CARE_PROVIDER_SITE_OTHER): Payer: BLUE CROSS/BLUE SHIELD | Admitting: Internal Medicine

## 2016-06-21 DIAGNOSIS — R059 Cough, unspecified: Secondary | ICD-10-CM | POA: Insufficient documentation

## 2016-06-21 DIAGNOSIS — R05 Cough: Secondary | ICD-10-CM | POA: Diagnosis not present

## 2016-06-21 MED ORDER — BENZONATATE 200 MG PO CAPS: 200.0000 mg | ORAL_CAPSULE | Freq: Three times a day (TID) | ORAL | 0 refills | Status: DC | PRN

## 2016-06-21 MED ORDER — PREDNISONE 20 MG PO TABS: 40.0000 mg | ORAL_TABLET | Freq: Every day | ORAL | 0 refills | Status: DC

## 2016-06-21 NOTE — Assessment & Plan Note (Signed)
Rx for prednisone and tessalon perles. No indication for antibiotics today. Overall symptoms are improving and talked about expected course.

## 2016-06-21 NOTE — Progress Notes (Signed)
Pre visit review using our clinic review tool, if applicable. No additional management support is needed unless otherwise documented below in the visit note. 

## 2016-06-21 NOTE — Patient Instructions (Signed)
We have sent in the prednisone. Take 2 pills a day for 5 days then stop.  Consider using the nasacort for the next couple of weeks as well.   We have sent in the cough medicine called tessalon perles which you can take 1 pill up to 3 times per day as needed for cough.   Your lungs are clear and you do not need antibiotics today.

## 2016-06-21 NOTE — Progress Notes (Signed)
   Subjective:    Patient ID: [name] [name], male    DOB: [birth year], 60 y.o.   MRN: [id number]  HPI The patient is a 60 YO man coming in for cough for 3 weeks with nose congestion and sore throat the last 3 days or so. He has not tried anything for it. Overall stable cough for the last several weeks to mildly improving. He was just worried that he was still coughing and wanted to get checked out. No SOB. No change to activity. Around sick grandchildren before the onset. Wife sick with the same thing.   Review of Systems  Constitutional: Negative for activity change, appetite change, chills, fatigue, fever and unexpected weight change.  HENT: Positive for congestion, postnasal drip and sore throat. Negative for drooling, ear discharge, ear pain, rhinorrhea, sinus pain, sinus pressure and trouble swallowing.   Eyes: Negative.   Respiratory: Positive for cough. Negative for chest tightness, shortness of breath and wheezing.   Cardiovascular: Negative.   Gastrointestinal: Negative.   Musculoskeletal: Negative.       Objective:   Physical Exam  Constitutional: He appears well-developed and well-nourished.  HENT:  Head: Normocephalic and atraumatic.  Right Ear: External ear normal.  Left Ear: External ear normal.  Oropharynx with redness and clear drainage.   Eyes: EOM are normal.  Neck: Normal range of motion.  Cardiovascular: Normal rate and regular rhythm.   Pulmonary/Chest: Effort normal and breath sounds normal. No respiratory distress. He has no wheezes. He has no rales.  Some scattered mild rhonchi which partially clear with cough.   Abdominal: Soft. He exhibits no distension. There is no tenderness. There is no rebound.  Lymphadenopathy:    He has no cervical adenopathy.  Skin: Skin is warm.   Vitals:   06/21/16 1031  BP: 110/60  Pulse: 67  Resp: 14  Temp: 98.2 F (36.8 C)  TempSrc: Oral  SpO2: 98%  Weight: 179 lb (81.2 kg)  Height: 5' 11.5" (1.816 m)        Assessment & Plan:

## 2016-07-18 ENCOUNTER — Telehealth: Payer: Self-pay | Admitting: Internal Medicine

## 2016-07-18 MED ORDER — ONDANSETRON HCL 4 MG PO TABS: 4.0000 mg | ORAL_TABLET | Freq: Three times a day (TID) | ORAL | 0 refills | Status: DC | PRN

## 2016-07-18 NOTE — Telephone Encounter (Signed)
zofran done erx 

## 2016-07-18 NOTE — Telephone Encounter (Signed)
Pt has stomach flu and would like to know if dr Jenny Reichmann could call in something for nausea

## 2016-08-14 ENCOUNTER — Other Ambulatory Visit: Payer: Self-pay | Admitting: Internal Medicine

## 2016-09-12 ENCOUNTER — Other Ambulatory Visit (INDEPENDENT_AMBULATORY_CARE_PROVIDER_SITE_OTHER): Payer: BLUE CROSS/BLUE SHIELD

## 2016-09-12 DIAGNOSIS — Z0000 Encounter for general adult medical examination without abnormal findings: Secondary | ICD-10-CM | POA: Diagnosis not present

## 2016-09-12 LAB — HEPATIC FUNCTION PANEL
ALT: 16 U/L (ref 0–53)
AST: 19 U/L (ref 0–37)
Albumin: 4.2 g/dL (ref 3.5–5.2)
Alkaline Phosphatase: 63 U/L (ref 39–117)
Bilirubin, Direct: 0.2 mg/dL (ref 0.0–0.3)
TOTAL PROTEIN: 6.3 g/dL (ref 6.0–8.3)
Total Bilirubin: 0.8 mg/dL (ref 0.2–1.2)

## 2016-09-12 LAB — URINALYSIS, ROUTINE W REFLEX MICROSCOPIC
BILIRUBIN URINE: NEGATIVE
Hgb urine dipstick: NEGATIVE
Leukocytes, UA: NEGATIVE
Nitrite: NEGATIVE
PH: 6.5 (ref 5.0–8.0)
SPECIFIC GRAVITY, URINE: 1.015 (ref 1.000–1.030)
TOTAL PROTEIN, URINE-UPE24: NEGATIVE
UROBILINOGEN UA: 1 (ref 0.0–1.0)
Urine Glucose: NEGATIVE

## 2016-09-12 LAB — CBC WITH DIFFERENTIAL/PLATELET
BASOS ABS: 0.1 10*3/uL (ref 0.0–0.1)
Basophils Relative: 0.9 % (ref 0.0–3.0)
EOS ABS: 0.2 10*3/uL (ref 0.0–0.7)
Eosinophils Relative: 1.8 % (ref 0.0–5.0)
HEMATOCRIT: 44.7 % (ref 39.0–52.0)
Hemoglobin: 15.1 g/dL (ref 13.0–17.0)
LYMPHS PCT: 16.5 % (ref 12.0–46.0)
Lymphs Abs: 1.4 10*3/uL (ref 0.7–4.0)
MCHC: 33.7 g/dL (ref 30.0–36.0)
MCV: 89.6 fl (ref 78.0–100.0)
MONO ABS: 0.8 10*3/uL (ref 0.1–1.0)
Monocytes Relative: 9.5 % (ref 3.0–12.0)
NEUTROS ABS: 6.2 10*3/uL (ref 1.4–7.7)
Neutrophils Relative %: 71.3 % (ref 43.0–77.0)
PLATELETS: 232 10*3/uL (ref 150.0–400.0)
RBC: 4.99 Mil/uL (ref 4.22–5.81)
RDW: 13.1 % (ref 11.5–15.5)
WBC: 8.7 10*3/uL (ref 4.0–10.5)

## 2016-09-12 LAB — BASIC METABOLIC PANEL
BUN: 18 mg/dL (ref 6–23)
CHLORIDE: 100 meq/L (ref 96–112)
CO2: 33 meq/L — AB (ref 19–32)
CREATININE: 0.94 mg/dL (ref 0.40–1.50)
Calcium: 9.3 mg/dL (ref 8.4–10.5)
GFR: 86.63 mL/min (ref >60.00)
Glucose, Bld: 86 mg/dL (ref 70–99)
POTASSIUM: 4 meq/L (ref 3.5–5.1)
Sodium: 140 mEq/L (ref 135–145)

## 2016-09-12 LAB — LIPID PANEL
CHOLESTEROL: 147 mg/dL (ref 0–200)
HDL: 55.2 mg/dL (ref >39.00)
LDL Cholesterol: 79 mg/dL (ref 0–99)
NonHDL: 91.71
TRIGLYCERIDES: 66 mg/dL (ref 0.0–149.0)
Total CHOL/HDL Ratio: 3
VLDL: 13.2 mg/dL (ref 0.0–40.0)

## 2016-09-12 LAB — TSH: TSH: 1.55 u[IU]/mL (ref 0.35–4.50)

## 2016-09-12 LAB — PSA: PSA: 1.09 ng/mL (ref 0.10–4.00)

## 2016-09-14 ENCOUNTER — Ambulatory Visit (INDEPENDENT_AMBULATORY_CARE_PROVIDER_SITE_OTHER): Payer: BLUE CROSS/BLUE SHIELD | Admitting: Internal Medicine

## 2016-09-14 ENCOUNTER — Encounter: Payer: Self-pay | Admitting: Internal Medicine

## 2016-09-14 VITALS — BP 126/84 | HR 74 | Temp 98.0°F | Ht 71.5 in | Wt 179.0 lb

## 2016-09-14 DIAGNOSIS — Z0000 Encounter for general adult medical examination without abnormal findings: Secondary | ICD-10-CM

## 2016-09-14 NOTE — Progress Notes (Signed)
Subjective:    Patient ID: [name] [name], male    DOB: 13-Jan-1956, 61 y.o.   MRN: [id number]  HPI  Here for wellness and f/u;  Overall doing ok;  Pt denies Chest pain, worsening SOB, DOE, wheezing, orthopnea, PND, worsening LE edema, palpitations, dizziness or syncope.  Pt denies neurological change such as new headache, facial or extremity weakness.  Pt denies polydipsia, polyuria, or low sugar symptoms. Pt states overall good compliance with treatment and medications, good tolerability, and has been trying to follow appropriate diet.  Pt denies worsening depressive symptoms, suicidal ideation or panic. No fever, night sweats, wt loss, loss of appetite, or other constitutional symptoms.  Pt states good ability with ADL's, has low fall risk, home safety reviewed and adequate, no other significant changes in hearing or vision, and only occasionally active with exercise, as is s/p repair right medial meniscal tear, Wt Readings from Last 3 Encounters:  09/14/16 179 lb (81.2 kg)  06/21/16 179 lb (81.2 kg)  11/10/15 175 lb (79.4 kg)   Past Medical History:  Diagnosis Date  . Anxiety   . GERD (gastroesophageal reflux disease)   . History of kidney stones   . Hyperlipidemia   . RBBB    NOTED IN 2006   Past Surgical History:  Procedure Laterality Date  . CARDIOVASCULAR STRESS TEST  09-06-2007  DR Johnsie Cancel   NORMAL NUCLEAR STUDY/  NO ISCHEMIA/  EF 53%  . COLONOSCOPY  2007  POLYPECTOMY/   2012  NEGATIVE   neg 07/06/2099  . CYSTOSCOPY W/ URETERAL STENT REMOVAL Bilateral 07/07/2013   Procedure: CYSTOSCOPY WITH STENT REMOVAL;  Surgeon: Alexis Frock, MD;  Location: [facility];  Service: Urology;  Laterality: Bilateral;  . CYSTOSCOPY WITH RETROGRADE PYELOGRAM, URETEROSCOPY AND STENT PLACEMENT Bilateral 06/18/2013   Procedure: CYSTOSCOPY WITH RETROGRADE PYELOGRAM, URETEROSCOPY AND STENT PLACEMENT;  Surgeon: Alexis Frock, MD;  Location: [facility];  Service:  Urology;  Laterality: Bilateral;  . EXTRACORPOREAL SHOCK WAVE LITHOTRIPSY Left JUNE 2009  . HOLMIUM LASER APPLICATION Bilateral 40/04/2724   Procedure: HOLMIUM LASER APPLICATION;  Surgeon: Alexis Frock, MD;  Location: [facility];  Service: Urology;  Laterality: Bilateral;  . KNEE ARTHROSCOPY W/ MENISCAL REPAIR Right MAY 2014  . LEFT URETEROSCOPIC STONE EXTRACTION  04-04-2008  . RETINAL TEAR REPAIR CRYOTHERAPY Left 09/2014   Repaired by laser surgery  . SQUAMOUS CELL CARCINOMA EXCISION Right 07/2014   Had skin graft from in front of ear  . TONSILLECTOMY AND ADENOIDECTOMY  AS CHILD  . UPPER GASTROINTESTINAL ENDOSCOPY  2011   GERD    reports that he has never smoked. He has never used smokeless tobacco. He reports that he drinks about 8.4 oz of alcohol per week . He reports that he does not use drugs. family history includes Cancer in his mother; Dementia in his mother; Diabetes in his father; Heart disease in his father, maternal grandfather, and maternal grandmother; Hypertension in his mother. Allergies  Allergen Reactions  . Codeine Nausea And Vomiting   Current Outpatient Prescriptions on File Prior to Visit  Medication Sig Dispense Refill  . aspirin 81 MG tablet Take 81 mg by mouth daily.    Marland Kitchen buPROPion (WELLBUTRIN XL) 300 MG 24 hr tablet Take 300 mg by mouth daily.    . indapamide (LOZOL) 2.5 MG tablet Take 2.5 mg by mouth daily.    Marland Kitchen LORazepam (ATIVAN) 1 MG tablet Take 0.5-1 mg by mouth every 8 (eight) hours.    Marland Kitchen  omeprazole (PRILOSEC) 20 MG capsule TAKE ONE CAPSULE BY MOUTH DAILY 90 capsule 0  . rosuvastatin (CRESTOR) 20 MG tablet Take 1 tablet (20 mg total) by mouth daily. 90 tablet 3  . zolpidem (AMBIEN) 10 MG tablet Take 10 mg by mouth at bedtime as needed for sleep.     No current facility-administered medications on file prior to visit.    Review of Systems Constitutional: Negative for increased diaphoresis, or other activity, appetite or siginficant weight  change other than noted HENT: Negative for worsening hearing loss, ear pain, facial swelling, mouth sores and neck stiffness.   Eyes: Negative for other worsening pain, redness or visual disturbance.  Respiratory: Negative for choking or stridor Cardiovascular: Negative for other chest pain and palpitations.  Gastrointestinal: Negative for worsening diarrhea, blood in stool, or abdominal distention Genitourinary: Negative for hematuria, flank pain or change in urine volume.  Musculoskeletal: Negative for myalgias or other joint complaints.  Skin: Negative for other color change and wound or drainage.  Neurological: Negative for syncope and numbness. other than noted Hematological: Negative for adenopathy. or other swelling Psychiatric/Behavioral: Negative for hallucinations, SI, self-injury, decreased concentration or other worsening agitation.  All other system neg per pt    Objective:   Physical Exam BP 126/84   Pulse 74   Temp 98 F (36.7 C)   Ht 5' 11.5" (1.816 m)   Wt 179 lb (81.2 kg)   SpO2 98%   BMI 24.62 kg/m  VS noted,  Constitutional: Pt is oriented to person, place, and time. Appears well-developed and well-nourished, in no significant distress Head: Normocephalic and atraumatic  Eyes: Conjunctivae and EOM are normal. Pupils are equal, round, and reactive to light Right Ear: External ear normal.  Left Ear: External ear normal Nose: Nose normal.  Mouth/Throat: Oropharynx is clear and moist  Neck: Normal range of motion. Neck supple. No JVD present. No tracheal deviation present or significant neck LA or mass Cardiovascular: Normal rate, regular rhythm, normal heart sounds and intact distal pulses.   Pulmonary/Chest: Effort normal and breath sounds without rales or wheezing  Abdominal: Soft. Bowel sounds are normal. NT. No HSM  Musculoskeletal: Normal range of motion. Exhibits no edema Lymphadenopathy: Has no cervical adenopathy.  Neurological: Pt is alert and oriented  to person, place, and time. Pt has normal reflexes. No cranial nerve deficit. Motor grossly intact Skin: Skin is warm and dry. No rash noted or new ulcers Psychiatric:  Has normal mood and affect. Behavior is normal.  No other exam findings    Assessment & Plan:

## 2016-09-14 NOTE — Patient Instructions (Signed)
Please continue all other medications as before, and refills have been done if requested.  Please have the pharmacy call with any other refills you may need.  Please continue your efforts at being more active, low cholesterol diet, and weight control.  You are otherwise up to date with prevention measures today.  Please keep your appointments with your specialists as you may have planned  Please return in 1 year for your yearly visit, or sooner if needed, with Lab testing done 3-5 days before  

## 2016-09-16 NOTE — Assessment & Plan Note (Signed)

## 2016-11-17 ENCOUNTER — Other Ambulatory Visit: Payer: Self-pay | Admitting: Internal Medicine

## 2016-12-20 ENCOUNTER — Encounter: Payer: Self-pay | Admitting: Internal Medicine

## 2016-12-20 ENCOUNTER — Ambulatory Visit (INDEPENDENT_AMBULATORY_CARE_PROVIDER_SITE_OTHER): Payer: BLUE CROSS/BLUE SHIELD | Admitting: Internal Medicine

## 2016-12-20 VITALS — BP 126/74 | HR 88 | Temp 98.4°F | Resp 16 | Wt 176.0 lb

## 2016-12-20 DIAGNOSIS — R05 Cough: Secondary | ICD-10-CM

## 2016-12-20 DIAGNOSIS — R059 Cough, unspecified: Secondary | ICD-10-CM

## 2016-12-20 MED ORDER — HYDROCODONE-HOMATROPINE 5-1.5 MG/5ML PO SYRP: 5.0000 mL | ORAL_SOLUTION | Freq: Four times a day (QID) | ORAL | 0 refills | Status: AC | PRN

## 2016-12-20 MED ORDER — LEVOFLOXACIN 500 MG PO TABS: 500.0000 mg | ORAL_TABLET | Freq: Every day | ORAL | 0 refills | Status: AC

## 2016-12-20 NOTE — Assessment & Plan Note (Signed)
Mild to mod, c/w bronchitis vs pna, declines cxr, for antibx course, cough med prn,  to f/u any worsening symptoms or concerns 

## 2016-12-20 NOTE — Progress Notes (Signed)
Subjective:    Patient ID: [name] [name], male    DOB: [birth year], 61 y.o.   MRN: [id number]  HPI  Here with acute onset mild to mod 2-3 days ST, HA, general weakness and malaise, with prod cough greenish sputum, but Pt denies chest pain, increased sob or doe, wheezing, orthopnea, PND, increased LE swelling, palpitations, dizziness or syncope.   Past Medical History:  Diagnosis Date  . Anxiety   . GERD (gastroesophageal reflux disease)   . History of kidney stones   . Hyperlipidemia   . RBBB    NOTED IN 2006   Past Surgical History:  Procedure Laterality Date  . CARDIOVASCULAR STRESS TEST  09-06-2007  DR Johnsie Cancel   NORMAL NUCLEAR STUDY/  NO ISCHEMIA/  EF 53%  . COLONOSCOPY  2007  POLYPECTOMY/   2012  NEGATIVE   neg 07/06/2099  . CYSTOSCOPY W/ URETERAL STENT REMOVAL Bilateral 07/07/2013   Procedure: CYSTOSCOPY WITH STENT REMOVAL;  Surgeon: Alexis Frock, MD;  Location: [facility];  Service: Urology;  Laterality: Bilateral;  . CYSTOSCOPY WITH RETROGRADE PYELOGRAM, URETEROSCOPY AND STENT PLACEMENT Bilateral 06/18/2013   Procedure: CYSTOSCOPY WITH RETROGRADE PYELOGRAM, URETEROSCOPY AND STENT PLACEMENT;  Surgeon: Alexis Frock, MD;  Location: [facility];  Service: Urology;  Laterality: Bilateral;  . EXTRACORPOREAL SHOCK WAVE LITHOTRIPSY Left JUNE 2009  . HOLMIUM LASER APPLICATION Bilateral 40/98/1191   Procedure: HOLMIUM LASER APPLICATION;  Surgeon: Alexis Frock, MD;  Location: [facility];  Service: Urology;  Laterality: Bilateral;  . KNEE ARTHROSCOPY W/ MENISCAL REPAIR Right MAY 2014  . LEFT URETEROSCOPIC STONE EXTRACTION  04-04-2008  . RETINAL TEAR REPAIR CRYOTHERAPY Left 09/2014   Repaired by laser surgery  . SQUAMOUS CELL CARCINOMA EXCISION Right 07/2014   Had skin graft from in front of ear  . TONSILLECTOMY AND ADENOIDECTOMY  AS CHILD  . UPPER GASTROINTESTINAL ENDOSCOPY  2011   GERD    reports that he has never smoked. He has  never used smokeless tobacco. He reports that he drinks about 8.4 oz of alcohol per week . He reports that he does not use drugs. family history includes Cancer in his mother; Dementia in his mother; Diabetes in his father; Heart disease in his father, maternal grandfather, and maternal grandmother; Hypertension in his mother. Allergies  Allergen Reactions  . Codeine Nausea And Vomiting   Current Outpatient Prescriptions on File Prior to Visit  Medication Sig Dispense Refill  . aspirin 81 MG tablet Take 81 mg by mouth daily.    Marland Kitchen buPROPion (WELLBUTRIN XL) 300 MG 24 hr tablet Take 300 mg by mouth daily.    . indapamide (LOZOL) 2.5 MG tablet Take 2.5 mg by mouth daily.    Marland Kitchen LORazepam (ATIVAN) 1 MG tablet Take 0.5-1 mg by mouth every 8 (eight) hours.    Marland Kitchen omeprazole (PRILOSEC) 20 MG capsule TAKE ONE CAPSULE BY MOUTH DAILY 90 capsule 2  . rosuvastatin (CRESTOR) 20 MG tablet Take 1 tablet (20 mg total) by mouth daily. 90 tablet 3  . zolpidem (AMBIEN) 10 MG tablet Take 10 mg by mouth at bedtime as needed for sleep.     No current facility-administered medications on file prior to visit.    Review of Systems All otherwise neg per pt    Objective:   Physical Exam BP 126/74   Pulse 88   Temp 98.4 F (36.9 C) (Oral)   Resp 16   Wt 176 lb (79.8 kg)   SpO2 98%  BMI 24.20 kg/m  VS noted, mild ill Constitutional: Pt appears in NAD HENT: Head: NCAT.  Right Ear: External ear normal.  Left Ear: External ear normal.  Eyes: . Pupils are equal, round, and reactive to light. Conjunctivae and EOM are normal Bilat tm's with mild erythema.  Max sinus areas non tender.  Pharynx with mild erythema, no exudate Nose: without d/c or deformity Neck: Neck supple. Gross normal ROM Cardiovascular: Normal rate and regular rhythm.   Pulmonary/Chest: Effort normal and breath sounds decreased without rales or wheezing.  Neurological: Pt is alert. At baseline orientation, motor grossly intact Skin: Skin is  warm. No rashes, other new lesions, no LE edema Psychiatric: Pt behavior is normal without agitation  No other exam findings    Assessment & Plan:

## 2016-12-20 NOTE — Patient Instructions (Signed)
Please take all new medication as prescribed - the antibiotic, and cough medicine if needed  You can also take Mucinex (or it's generic off brand) for congestion, and tylenol as needed for pain.  Please continue all other medications as before, and refills have been done if requested.  Please have the pharmacy call with any other refills you may need.  Please keep your appointments with your specialists as you may have planned   

## 2017-01-18 ENCOUNTER — Other Ambulatory Visit: Payer: Self-pay

## 2017-01-18 MED ORDER — ROSUVASTATIN CALCIUM 20 MG PO TABS: 20.0000 mg | ORAL_TABLET | Freq: Every day | ORAL | 3 refills | Status: DC

## 2017-02-13 ENCOUNTER — Encounter: Payer: Self-pay | Admitting: Internal Medicine

## 2017-02-13 MED ORDER — ZOSTER VAC RECOMB ADJUVANTED 50 MCG/0.5ML IM SUSR: 0.5000 mL | Freq: Once | INTRAMUSCULAR | 1 refills | Status: AC

## 2017-06-08 ENCOUNTER — Telehealth: Payer: Self-pay | Admitting: Internal Medicine

## 2017-06-08 MED ORDER — ZOSTER VAC RECOMB ADJUVANTED 50 MCG/0.5ML IM SUSR
0.5000 mL | Freq: Once | INTRAMUSCULAR | 0 refills | Status: AC
Start: 2017-06-08 — End: 2017-06-08

## 2017-06-08 NOTE — Telephone Encounter (Signed)
Copied from [place] [id number]. Topic: Quick Communication - See Telephone Encounter >> Jun 08, 2017 10:45 AM Percell Belt A wrote: CRM for notification. See Telephone encounter for:  pt called in said that he [facility] [place]  [facility] not have [facility] in [facility] so he would like Dr Jenny Reichmann to send a script to [facility] outpatient pharmacy for his 2nd injection.  He called them and they have plenty.   06/08/17.

## 2017-06-08 NOTE — Telephone Encounter (Signed)
This has been sent

## 2017-06-08 NOTE — Telephone Encounter (Signed)
Please advise 

## 2017-06-11 MED FILL — SHINGRIX VIAL KIT: 50 | 30 days supply | Qty: 1 | Fill #0

## 2017-07-27 ENCOUNTER — Encounter: Payer: Self-pay | Admitting: Internal Medicine

## 2017-07-30 ENCOUNTER — Other Ambulatory Visit (INDEPENDENT_AMBULATORY_CARE_PROVIDER_SITE_OTHER): Payer: BLUE CROSS/BLUE SHIELD

## 2017-07-30 DIAGNOSIS — Z0000 Encounter for general adult medical examination without abnormal findings: Secondary | ICD-10-CM | POA: Diagnosis not present

## 2017-07-30 LAB — URINALYSIS, ROUTINE W REFLEX MICROSCOPIC
Bilirubin Urine: NEGATIVE
Hgb urine dipstick: NEGATIVE
KETONES UR: NEGATIVE
Nitrite: NEGATIVE
RBC / HPF: NONE SEEN (ref 0–?)
SPECIFIC GRAVITY, URINE: 1.02 (ref 1.000–1.030)
Total Protein, Urine: NEGATIVE
UROBILINOGEN UA: 1 (ref 0.0–1.0)
Urine Glucose: NEGATIVE
pH: 7 (ref 5.0–8.0)

## 2017-07-30 LAB — PSA: PSA: 1.59 ng/mL (ref 0.10–4.00)

## 2017-07-30 LAB — CBC WITH DIFFERENTIAL/PLATELET
Basophils Absolute: 0 10*3/uL (ref 0.0–0.1)
Basophils Relative: 0.5 % (ref 0.0–3.0)
EOS ABS: 0.1 10*3/uL (ref 0.0–0.7)
EOS PCT: 1.7 % (ref 0.0–5.0)
HCT: 45 % (ref 39.0–52.0)
HEMOGLOBIN: 15.3 g/dL (ref 13.0–17.0)
Lymphocytes Relative: 21.4 % (ref 12.0–46.0)
Lymphs Abs: 1.9 10*3/uL (ref 0.7–4.0)
MCHC: 34 g/dL (ref 30.0–36.0)
MCV: 89.1 fl (ref 78.0–100.0)
MONO ABS: 0.9 10*3/uL (ref 0.1–1.0)
Monocytes Relative: 10.1 % (ref 3.0–12.0)
Neutro Abs: 5.8 10*3/uL (ref 1.4–7.7)
Neutrophils Relative %: 66.3 % (ref 43.0–77.0)
Platelets: 226 10*3/uL (ref 150.0–400.0)
RBC: 5.05 Mil/uL (ref 4.22–5.81)
RDW: 13 % (ref 11.5–15.5)
WBC: 8.7 10*3/uL (ref 4.0–10.5)

## 2017-07-30 LAB — BASIC METABOLIC PANEL
BUN: 21 mg/dL (ref 6–23)
CALCIUM: 9.5 mg/dL (ref 8.4–10.5)
CO2: 32 mEq/L (ref 19–32)
Chloride: 99 mEq/L (ref 96–112)
Creatinine, Ser: 0.98 mg/dL (ref 0.40–1.50)
GFR: 82.32 mL/min (ref >60.00)
GLUCOSE: 106 mg/dL — AB (ref 70–99)
Potassium: 4.1 mEq/L (ref 3.5–5.1)
Sodium: 140 mEq/L (ref 135–145)

## 2017-07-30 LAB — LIPID PANEL
Cholesterol: 152 mg/dL (ref 0–200)
HDL: 59.6 mg/dL (ref >39.00)
LDL Cholesterol: 81 mg/dL (ref 0–99)
NONHDL: 92.35
Total CHOL/HDL Ratio: 3
Triglycerides: 58 mg/dL (ref 0.0–149.0)
VLDL: 11.6 mg/dL (ref 0.0–40.0)

## 2017-07-30 LAB — HEPATIC FUNCTION PANEL
ALK PHOS: 64 U/L (ref 39–117)
ALT: 17 U/L (ref 0–53)
AST: 18 U/L (ref 0–37)
Albumin: 4.3 g/dL (ref 3.5–5.2)
BILIRUBIN DIRECT: 0.1 mg/dL (ref 0.0–0.3)
BILIRUBIN TOTAL: 0.6 mg/dL (ref 0.2–1.2)
Total Protein: 6.3 g/dL (ref 6.0–8.3)

## 2017-07-30 LAB — TSH: TSH: 1.75 u[IU]/mL (ref 0.35–4.50)

## 2017-07-31 LAB — HIV ANTIBODY (ROUTINE TESTING W REFLEX): HIV: NONREACTIVE

## 2017-08-01 ENCOUNTER — Ambulatory Visit (INDEPENDENT_AMBULATORY_CARE_PROVIDER_SITE_OTHER): Payer: BLUE CROSS/BLUE SHIELD | Admitting: Internal Medicine

## 2017-08-01 ENCOUNTER — Encounter: Payer: Self-pay | Admitting: Internal Medicine

## 2017-08-01 VITALS — BP 128/84 | HR 70 | Temp 98.4°F | Ht 71.5 in | Wt 182.0 lb

## 2017-08-01 DIAGNOSIS — R739 Hyperglycemia, unspecified: Secondary | ICD-10-CM | POA: Diagnosis not present

## 2017-08-01 DIAGNOSIS — R8290 Unspecified abnormal findings in urine: Secondary | ICD-10-CM

## 2017-08-01 DIAGNOSIS — Z0000 Encounter for general adult medical examination without abnormal findings: Secondary | ICD-10-CM | POA: Diagnosis not present

## 2017-08-01 DIAGNOSIS — R972 Elevated prostate specific antigen [PSA]: Secondary | ICD-10-CM | POA: Diagnosis not present

## 2017-08-01 MED ORDER — DOXYCYCLINE HYCLATE 100 MG PO TABS: 100.0000 mg | ORAL_TABLET | Freq: Two times a day (BID) | ORAL | 0 refills | Status: DC

## 2017-08-01 NOTE — Patient Instructions (Addendum)
Please take all new medication as prescribed - the antibiotic  Please return in 4 weeks for follow up PSA and urine testing  Please continue all other medications as before, and refills have been done if requested.  Please have the pharmacy call with any other refills you may need.  Please continue your efforts at being more active, low cholesterol diet, and weight control.  You are otherwise up to date with prevention measures today.  Please keep your appointments with your specialists as you may have planned  Please return in 1 year for your yearly visit, or sooner if needed, with Lab testing done 3-5 days before

## 2017-08-01 NOTE — Progress Notes (Signed)
Subjective:    Patient ID: [name] [name], male    DOB: Sep 05, 1955, 62 y.o.   MRN: [id number]  HPI  Here for wellness and f/u;  Overall doing ok;  Pt denies Chest pain, worsening SOB, DOE, wheezing, orthopnea, PND, worsening LE edema, palpitations, dizziness or syncope.  Pt denies neurological change such as new headache, facial or extremity weakness.  Pt denies polydipsia, polyuria, or low sugar symptoms. Pt states overall good compliance with treatment and medications, good tolerability, and has been trying to follow appropriate diet.  Pt denies worsening depressive symptoms, suicidal ideation or panic. No fever, night sweats, wt loss, loss of appetite, or other constitutional symptoms.  Pt states good ability with ADL's, has low fall risk, home safety reviewed and adequate, no other significant changes in hearing or vision, and only occasionally active with exercise.  Denies urinary symptoms such as dysuria, frequency, urgency, flank pain, hematuria or n/v, fever, chills.  No other interval hx or new complaints Past Medical History:  Diagnosis Date  . Anxiety   . GERD (gastroesophageal reflux disease)   . History of kidney stones   . Hyperlipidemia   . RBBB    NOTED IN 2006   Past Surgical History:  Procedure Laterality Date  . CARDIOVASCULAR STRESS TEST  09-06-2007  DR Johnsie Cancel   NORMAL NUCLEAR STUDY/  NO ISCHEMIA/  EF 53%  . COLONOSCOPY  2007  POLYPECTOMY/   2012  NEGATIVE   neg 07/06/2099  . CYSTOSCOPY W/ URETERAL STENT REMOVAL Bilateral 07/07/2013   Procedure: CYSTOSCOPY WITH STENT REMOVAL;  Surgeon: Alexis Frock, MD;  Location: [facility];  Service: Urology;  Laterality: Bilateral;  . CYSTOSCOPY WITH RETROGRADE PYELOGRAM, URETEROSCOPY AND STENT PLACEMENT Bilateral 06/18/2013   Procedure: CYSTOSCOPY WITH RETROGRADE PYELOGRAM, URETEROSCOPY AND STENT PLACEMENT;  Surgeon: Alexis Frock, MD;  Location: [facility];  Service: Urology;  Laterality:  Bilateral;  . EXTRACORPOREAL SHOCK WAVE LITHOTRIPSY Left JUNE 2009  . HOLMIUM LASER APPLICATION Bilateral 58/52/7782   Procedure: HOLMIUM LASER APPLICATION;  Surgeon: Alexis Frock, MD;  Location: [facility];  Service: Urology;  Laterality: Bilateral;  . KNEE ARTHROSCOPY W/ MENISCAL REPAIR Right MAY 2014  . LEFT URETEROSCOPIC STONE EXTRACTION  04-04-2008  . RETINAL TEAR REPAIR CRYOTHERAPY Left 09/2014   Repaired by laser surgery  . SQUAMOUS CELL CARCINOMA EXCISION Right 07/2014   Had skin graft from in front of ear  . TONSILLECTOMY AND ADENOIDECTOMY  AS CHILD  . UPPER GASTROINTESTINAL ENDOSCOPY  2011   GERD    reports that  has never smoked. he has never used smokeless tobacco. He reports that he drinks about 8.4 oz of alcohol per week. He reports that he does not use drugs. family history includes Cancer in his mother; Dementia in his mother; Diabetes in his father; Heart disease in his father, maternal grandfather, and maternal grandmother; Hypertension in his mother. Allergies  Allergen Reactions  . Codeine Nausea And Vomiting   Current Outpatient Medications on File Prior to Visit  Medication Sig Dispense Refill  . aspirin 81 MG tablet Take 81 mg by mouth daily.    Marland Kitchen buPROPion (WELLBUTRIN XL) 300 MG 24 hr tablet Take 300 mg by mouth daily.    . indapamide (LOZOL) 2.5 MG tablet Take 2.5 mg by mouth daily.    Marland Kitchen LORazepam (ATIVAN) 1 MG tablet Take 0.5-1 mg by mouth every 8 (eight) hours.    Marland Kitchen omeprazole (PRILOSEC) 20 MG capsule TAKE ONE CAPSULE BY MOUTH  DAILY 90 capsule 2  . rosuvastatin (CRESTOR) 20 MG tablet Take 1 tablet (20 mg total) by mouth daily. 90 tablet 3  . zolpidem (AMBIEN) 10 MG tablet Take 10 mg by mouth at bedtime as needed for sleep.     No current facility-administered medications on file prior to visit.    Review of Systems Constitutional: Negative for other unusual diaphoresis, sweats, appetite or weight changes HENT: Negative for other worsening  hearing loss, ear pain, facial swelling, mouth sores or neck stiffness.   Eyes: Negative for other worsening pain, redness or other visual disturbance.  Respiratory: Negative for other stridor or swelling Cardiovascular: Negative for other palpitations or other chest pain  Gastrointestinal: Negative for worsening diarrhea or loose stools, blood in stool, distention or other pain Genitourinary: Negative for hematuria, flank pain or other change in urine volume.  Musculoskeletal: Negative for myalgias or other joint swelling.  Skin: Negative for other color change, or other wound or worsening drainage.  Neurological: Negative for other syncope or numbness. Hematological: Negative for other adenopathy or swelling Psychiatric/Behavioral: Negative for hallucinations, other worsening agitation, SI, self-injury, or new decreased concentration All other system neg per pt    Objective:   Physical Exam BP 128/84   Pulse 70   Temp 98.4 F (36.9 C) (Oral)   Ht 5' 11.5" (1.816 m)   Wt 182 lb (82.6 kg)   SpO2 99%   BMI 25.03 kg/m  VS noted,  Constitutional: Pt is oriented to person, place, and time. Appears well-developed and well-nourished, in no significant distress and comfortable Head: Normocephalic and atraumatic  Eyes: Conjunctivae and EOM are normal. Pupils are equal, round, and reactive to light Right Ear: External ear normal without discharge Left Ear: External ear normal without discharge Nose: Nose without discharge or deformity Mouth/Throat: Oropharynx is without other ulcerations and moist  Neck: Normal range of motion. Neck supple. No JVD present. No tracheal deviation present or significant neck LA or mass Cardiovascular: Normal rate, regular rhythm, normal heart sounds and intact distal pulses.   Pulmonary/Chest: WOB normal and breath sounds without rales or wheezing  Abdominal: Soft. Bowel sounds are normal. NT. No HSM  Musculoskeletal: Normal range of motion. Exhibits no  edema Lymphadenopathy: Has no other cervical adenopathy.  Neurological: Pt is alert and oriented to person, place, and time. Pt has normal reflexes. No cranial nerve deficit. Motor grossly intact, Gait intact Skin: Skin is warm and dry. No rash noted or new ulcerations Psychiatric:  Has normal mood and affect. Behavior is normal without agitation No other exam findings Lab Results  Component Value Date   WBC 8.7 07/30/2017   HGB 15.3 07/30/2017   HCT 45.0 07/30/2017   PLT 226.0 07/30/2017   GLUCOSE 106 (H) 07/30/2017   CHOL 152 07/30/2017   TRIG 58.0 07/30/2017   HDL 59.60 07/30/2017   LDLDIRECT 130.7 10/16/2011   LDLCALC 81 07/30/2017   ALT 17 07/30/2017   AST 18 07/30/2017   NA 140 07/30/2017   K 4.1 07/30/2017   CL 99 07/30/2017   CREATININE 0.98 07/30/2017   BUN 21 07/30/2017   CO2 32 07/30/2017   TSH 1.75 07/30/2017   PSA 1.59 07/30/2017   HGBA1C 5.4 10/07/2012       Assessment & Plan:

## 2017-08-05 NOTE — Assessment & Plan Note (Signed)
stable overall by history and exam, recent data reviewed with pt, and pt to continue medical treatment as before,  to f/u any worsening symptoms or concerns Lab Results  Component Value Date   HGBA1C 5.4 10/07/2012

## 2017-08-05 NOTE — Assessment & Plan Note (Signed)
For UA with f/u psa after doxy course x 4 wks

## 2017-08-05 NOTE — Assessment & Plan Note (Signed)

## 2017-08-07 ENCOUNTER — Other Ambulatory Visit: Payer: Self-pay

## 2017-08-07 MED ORDER — OMEPRAZOLE 20 MG PO CPDR: 20.0000 mg | DELAYED_RELEASE_CAPSULE | Freq: Every day | ORAL | 3 refills | Status: DC

## 2017-09-04 ENCOUNTER — Other Ambulatory Visit (INDEPENDENT_AMBULATORY_CARE_PROVIDER_SITE_OTHER): Payer: BLUE CROSS/BLUE SHIELD

## 2017-09-04 DIAGNOSIS — Z0000 Encounter for general adult medical examination without abnormal findings: Secondary | ICD-10-CM

## 2017-09-04 DIAGNOSIS — R739 Hyperglycemia, unspecified: Secondary | ICD-10-CM | POA: Diagnosis not present

## 2017-09-04 DIAGNOSIS — R972 Elevated prostate specific antigen [PSA]: Secondary | ICD-10-CM

## 2017-09-04 LAB — CBC WITH DIFFERENTIAL/PLATELET
BASOS PCT: 0.6 % (ref 0.0–3.0)
Basophils Absolute: 0.1 10*3/uL (ref 0.0–0.1)
EOS PCT: 0.8 % (ref 0.0–5.0)
Eosinophils Absolute: 0.1 10*3/uL (ref 0.0–0.7)
HCT: 43.7 % (ref 39.0–52.0)
HEMOGLOBIN: 14.9 g/dL (ref 13.0–17.0)
Lymphocytes Relative: 17.7 % (ref 12.0–46.0)
Lymphs Abs: 1.5 10*3/uL (ref 0.7–4.0)
MCHC: 34 g/dL (ref 30.0–36.0)
MCV: 89.2 fl (ref 78.0–100.0)
MONO ABS: 0.7 10*3/uL (ref 0.1–1.0)
Monocytes Relative: 8.6 % (ref 3.0–12.0)
NEUTROS ABS: 6.2 10*3/uL (ref 1.4–7.7)
Neutrophils Relative %: 72.3 % (ref 43.0–77.0)
PLATELETS: 246 10*3/uL (ref 150.0–400.0)
RBC: 4.89 Mil/uL (ref 4.22–5.81)
RDW: 12.9 % (ref 11.5–15.5)
WBC: 8.5 10*3/uL (ref 4.0–10.5)

## 2017-09-04 LAB — HEPATIC FUNCTION PANEL
ALT: 23 U/L (ref 0–53)
AST: 21 U/L (ref 0–37)
Albumin: 4.1 g/dL (ref 3.5–5.2)
Alkaline Phosphatase: 63 U/L (ref 39–117)
BILIRUBIN TOTAL: 0.7 mg/dL (ref 0.2–1.2)
Bilirubin, Direct: 0.1 mg/dL (ref 0.0–0.3)
Total Protein: 6.5 g/dL (ref 6.0–8.3)

## 2017-09-04 LAB — URINALYSIS, ROUTINE W REFLEX MICROSCOPIC
BILIRUBIN URINE: NEGATIVE
Hgb urine dipstick: NEGATIVE
KETONES UR: NEGATIVE
Nitrite: NEGATIVE
RBC / HPF: NONE SEEN (ref 0–?)
SPECIFIC GRAVITY, URINE: 1.01 (ref 1.000–1.030)
TOTAL PROTEIN, URINE-UPE24: NEGATIVE
URINE GLUCOSE: NEGATIVE
UROBILINOGEN UA: 0.2 (ref 0.0–1.0)
pH: 7 (ref 5.0–8.0)

## 2017-09-04 LAB — BASIC METABOLIC PANEL
BUN: 19 mg/dL (ref 6–23)
CHLORIDE: 98 meq/L (ref 96–112)
CO2: 33 mEq/L — ABNORMAL HIGH (ref 19–32)
Calcium: 10 mg/dL (ref 8.4–10.5)
Creatinine, Ser: 1.02 mg/dL (ref 0.40–1.50)
GFR: 78.58 mL/min (ref >60.00)
GLUCOSE: 85 mg/dL (ref 70–99)
POTASSIUM: 4.4 meq/L (ref 3.5–5.1)
Sodium: 137 mEq/L (ref 135–145)

## 2017-09-04 LAB — LIPID PANEL
CHOLESTEROL: 151 mg/dL (ref 0–200)
HDL: 58.2 mg/dL (ref >39.00)
LDL Cholesterol: 78 mg/dL (ref 0–99)
NONHDL: 92.86
Total CHOL/HDL Ratio: 3
Triglycerides: 72 mg/dL (ref 0.0–149.0)
VLDL: 14.4 mg/dL (ref 0.0–40.0)

## 2017-09-04 LAB — TSH: TSH: 1.66 u[IU]/mL (ref 0.35–4.50)

## 2017-09-04 LAB — PSA: PSA: 1.53 ng/mL (ref 0.10–4.00)

## 2017-09-04 LAB — HEMOGLOBIN A1C: Hgb A1c MFr Bld: 5.4 % (ref 4.6–6.5)

## 2017-09-05 ENCOUNTER — Encounter: Payer: Self-pay | Admitting: Internal Medicine

## 2017-09-07 ENCOUNTER — Encounter: Payer: Self-pay | Admitting: Internal Medicine

## 2017-09-10 MED ORDER — ROSUVASTATIN CALCIUM 20 MG PO TABS: 20.0000 mg | ORAL_TABLET | Freq: Every day | ORAL | 3 refills | Status: DC

## 2017-09-26 ENCOUNTER — Encounter: Payer: Self-pay | Admitting: Nurse Practitioner

## 2017-10-02 ENCOUNTER — Encounter: Payer: Self-pay | Admitting: Internal Medicine

## 2017-10-10 ENCOUNTER — Encounter: Payer: Self-pay | Admitting: Nurse Practitioner

## 2017-10-10 ENCOUNTER — Ambulatory Visit: Payer: BLUE CROSS/BLUE SHIELD | Admitting: Nurse Practitioner

## 2017-10-10 VITALS — BP 104/68 | HR 72 | Ht 71.5 in | Wt 179.0 lb

## 2017-10-10 DIAGNOSIS — R197 Diarrhea, unspecified: Secondary | ICD-10-CM | POA: Diagnosis not present

## 2017-10-10 DIAGNOSIS — R194 Change in bowel habit: Secondary | ICD-10-CM

## 2017-10-10 NOTE — Patient Instructions (Signed)
If you are age 63 or older, your body mass index should be between 23-30. Your Body mass index is 24.62 kg/m. If this is out of the aforementioned range listed, please consider follow up with your Primary Care Provider.  If you are age 61 or younger, your body mass index should be between 19-25. Your Body mass index is 24.62 kg/m. If this is out of the aformentioned range listed, please consider follow up with your Primary Care Provider.   If recurrent diarrhea, call us to have stool checked.  Thank you for choosing me and [facility].   Tye Savoy, NP

## 2017-10-10 NOTE — Progress Notes (Signed)
Chief Complaint: Diarrhea, now resolved  Referring Provider:     self   ASSESSMENT AND PLAN;   62 year old male with acute bowel change with  Loose stool, now resolved.  Symptoms started while taking doxycycline and continued thereafter for several weeks.  Doubt C. difficile based on the low frequency and the fact that the stools have returned to baseline but certainly need to keep this in mind.   -Patient understands that C. difficile is a possibility and he will call us ASAP should stools become loose again    HPI:     62 year old male known remotely to Dr. Henrene Pastor.  He is due for another screening colonoscopy in 2021.  He comes in for evaluation of loose stool. Took doxycycline, I presume for possible prostatitis given an elevated PSA in January. Stool consistency changed almost immediately while taking doxycycline but lingered until just a week ago.  All of BMs were loose but no more than 1-2 times a day which is patient's normal pattern.  He did have some associated urgency which was new however. He had no associated abdominal pain or fevers.  No nausea or vomiting or unexpected weight loss.  He has had some loud gurgling in his stomach but this is not necessarily new.  There was no blood in the stool.  Patient decided to keep this appointment even though bowels have normalized just in case case symptoms recurred. Other than doxycycline he has not had any medication changes or dietary changes.   Past Medical History:  Diagnosis Date  . Anxiety   . GERD (gastroesophageal reflux disease)   . History of colon polyps   . History of kidney stones   . History of skin cancer    squamous  . Hyperlipidemia   . Kidney stones   . RBBB    NOTED IN 2006     Past Surgical History:  Procedure Laterality Date  . CARDIOVASCULAR STRESS TEST  09-06-2007  DR Johnsie Cancel   NORMAL NUCLEAR STUDY/  NO ISCHEMIA/  EF 53%  . COLONOSCOPY  2007  POLYPECTOMY/   2012  NEGATIVE   neg 07/06/2099  .  CYSTOSCOPY W/ URETERAL STENT REMOVAL Bilateral 07/07/2013   Procedure: CYSTOSCOPY WITH STENT REMOVAL;  Surgeon: Alexis Frock, MD;  Location: [facility];  Service: Urology;  Laterality: Bilateral;  . CYSTOSCOPY WITH RETROGRADE PYELOGRAM, URETEROSCOPY AND STENT PLACEMENT Bilateral 06/18/2013   Procedure: CYSTOSCOPY WITH RETROGRADE PYELOGRAM, URETEROSCOPY AND STENT PLACEMENT;  Surgeon: Alexis Frock, MD;  Location: [facility];  Service: Urology;  Laterality: Bilateral;  . EXTRACORPOREAL SHOCK WAVE LITHOTRIPSY Left JUNE 2009  . HOLMIUM LASER APPLICATION Bilateral 79/89/2119   Procedure: HOLMIUM LASER APPLICATION;  Surgeon: Alexis Frock, MD;  Location: [facility];  Service: Urology;  Laterality: Bilateral;  . KNEE ARTHROSCOPY W/ MENISCAL REPAIR Right MAY 2014  . LEFT URETEROSCOPIC STONE EXTRACTION  04-04-2008  . RETINAL TEAR REPAIR CRYOTHERAPY Left 09/2014   Repaired by laser surgery  . SQUAMOUS CELL CARCINOMA EXCISION Right 07/2014   Had skin graft from in front of ear  . TONSILLECTOMY AND ADENOIDECTOMY  AS CHILD  . UPPER GASTROINTESTINAL ENDOSCOPY  2011   GERD   Family History  Problem Relation Age of Onset  . Hypertension Mother   . Cancer Mother        breast & skin  . Dementia Mother   . Heart disease Father        MI in 45s, CBAG  .  Diabetes Father   . Heart disease Maternal Grandmother        MI in 74s  . Heart disease Maternal Grandfather        Mi in 60s  . Pancreatic cancer Paternal Aunt   . Crohn's disease Cousin        mother side  of the family  . Stroke Neg Hx   . Colon cancer Neg Hx   . Liver cancer Neg Hx    Social History   Tobacco Use  . Smoking status: Never Smoker  . Smokeless tobacco: Never Used  Substance Use Topics  . Alcohol use: Yes    Alcohol/week: 8.4 oz    Types: 14 Glasses of wine per week    Comment: 2 WINE DAILY  . Drug use: No   Current Outpatient Medications  Medication Sig Dispense Refill    . aspirin 81 MG tablet Take 81 mg by mouth daily.    Marland Kitchen buPROPion (WELLBUTRIN XL) 300 MG 24 hr tablet Take 300 mg by mouth daily.    . indapamide (LOZOL) 2.5 MG tablet Take 2.5 mg by mouth daily.    Marland Kitchen LORazepam (ATIVAN) 1 MG tablet Take 1 mg by mouth 2 (two) times daily.     Marland Kitchen omeprazole (PRILOSEC) 20 MG capsule Take 1 capsule (20 mg total) by mouth daily. 90 capsule 3  . rosuvastatin (CRESTOR) 20 MG tablet Take 1 tablet (20 mg total) by mouth daily. 90 tablet 3  . TURMERIC PO Take 2,000 mg by mouth daily.    Marland Kitchen zolpidem (AMBIEN) 10 MG tablet Take 10 mg by mouth at bedtime as needed for sleep.     No current facility-administered medications for this visit.    Allergies  Allergen Reactions  . Codeine Nausea And Vomiting   Review of Systems: Positive for anxiety, arthritis, and depression.  All other systems reviewed and negative except where noted in HPI.     Physical Exam:    Wt Readings from Last 3 Encounters:  10/10/17 179 lb (81.2 kg)  08/01/17 182 lb (82.6 kg)  12/20/16 176 lb (79.8 kg)    BP 104/68   Pulse 72   Ht 5' 11.5" (1.816 m)   Wt 179 lb (81.2 kg)   BMI 24.62 kg/m  Constitutional:  Well-developed, white male in no acute distress. Psychiatric: Normal mood and affect. Behavior is normal. EENT: Pupils normal.  Conjunctivae are normal. No scleral icterus. Neck supple.  Cardiovascular: Normal rate, regular rhythm. No edema Pulmonary/chest: Effort normal and breath sounds normal. No wheezing, rales or rhonchi. Abdominal: Soft, nondistended. Nontender. Bowel sounds active throughout. There are no masses palpable. No hepatomegaly. Neurological: Alert and oriented to person place and time. Skin: Skin is warm and dry. No rashes noted.  Tye Savoy, NP  10/10/2017, 10:47 AM

## 2017-10-12 ENCOUNTER — Encounter: Payer: Self-pay | Admitting: Nurse Practitioner

## 2017-10-12 NOTE — Progress Notes (Signed)
Assessment and plans reviewed  

## 2017-12-27 ENCOUNTER — Other Ambulatory Visit: Payer: Self-pay

## 2017-12-27 ENCOUNTER — Other Ambulatory Visit: Payer: BLUE CROSS/BLUE SHIELD

## 2017-12-27 DIAGNOSIS — R197 Diarrhea, unspecified: Secondary | ICD-10-CM

## 2017-12-28 ENCOUNTER — Telehealth: Payer: Self-pay | Admitting: Internal Medicine

## 2017-12-28 LAB — CLOSTRIDIUM DIFFICILE TOXIN B, QUALITATIVE, REAL-TIME PCR: CDIFFPCR: NOT DETECTED

## 2017-12-28 MED ORDER — VSL#3 PO CAPS: ORAL_CAPSULE | ORAL | 1 refills | Status: DC

## 2017-12-28 NOTE — Telephone Encounter (Signed)
Spoke with the patient and the spouse. The patient had a return of diarrhea which he describes as 2 to 4 loose stools on most days. He was last seen here in April. See that note for details. Submitted stool specimen for C Diff by PCR which has resulted as normal. He denies abdominal pain, bloody diarrhea or fevers. He has recently been taking Ibuprofen for a shoulder problem. He takes Turmeric. Uses Stevia for sugar substitute. He is not on any antibiotics and has not been on any since he was seen in April. Does not relate his symptoms to any foods.  He is interested in trying a probiotic first. He will stop Ibuprofen and try Tylenol. If he does not find the VSL#3 to be available or affordable he want to use Align or Florastor.  Discussed with Tye Savoy NP-C

## 2017-12-31 ENCOUNTER — Other Ambulatory Visit: Payer: Self-pay | Admitting: Orthopedic Surgery

## 2017-12-31 DIAGNOSIS — R52 Pain, unspecified: Secondary | ICD-10-CM

## 2017-12-31 DIAGNOSIS — R531 Weakness: Secondary | ICD-10-CM

## 2017-12-31 DIAGNOSIS — M2400 Loose body in unspecified joint: Secondary | ICD-10-CM

## 2018-01-01 NOTE — Telephone Encounter (Signed)
Yes, thank you. Probiotics will probably take few weeks to work. If not improving then let us know.

## 2018-01-02 ENCOUNTER — Other Ambulatory Visit: Payer: Self-pay | Admitting: Nurse Practitioner

## 2018-01-02 ENCOUNTER — Other Ambulatory Visit: Payer: Self-pay

## 2018-01-02 NOTE — Telephone Encounter (Signed)
Patient states he just received a call from [facility] notifying him that medication VSL #3 is no longer available. Pt states he is "furious" that [place] and [facility] did not already know this and that he has been waiting on a medication he can not get. Patient requesting something else be suggested "immediately".

## 2018-01-02 NOTE — Telephone Encounter (Signed)
Nevin Bloodgood, VSL#3 is not available. It is now called High Potency Probiotic. Everything about it is the same. New name.  I have shared this with the patient.

## 2018-01-03 NOTE — Telephone Encounter (Signed)
Thanks, I will ask Magda Paganini to have drug rep update [place].

## 2018-01-06 ENCOUNTER — Ambulatory Visit
Admission: RE | Admit: 2018-01-06 | Discharge: 2018-01-06 | Disposition: A | Discharge status: Home / Self Care | Payer: BLUE CROSS/BLUE SHIELD | Source: Ambulatory Visit | Attending: Orthopedic Surgery | Admitting: Orthopedic Surgery

## 2018-01-06 DIAGNOSIS — R52 Pain, unspecified: Secondary | ICD-10-CM

## 2018-01-06 DIAGNOSIS — M2400 Loose body in unspecified joint: Secondary | ICD-10-CM

## 2018-01-06 DIAGNOSIS — R531 Weakness: Secondary | ICD-10-CM

## 2018-01-07 DIAGNOSIS — M24011 Loose body in right shoulder: Secondary | ICD-10-CM | POA: Diagnosis not present

## 2018-01-07 DIAGNOSIS — M19011 Primary osteoarthritis, right shoulder: Secondary | ICD-10-CM | POA: Diagnosis not present

## 2018-01-16 DIAGNOSIS — M19011 Primary osteoarthritis, right shoulder: Secondary | ICD-10-CM | POA: Diagnosis not present

## 2018-01-16 DIAGNOSIS — M7541 Impingement syndrome of right shoulder: Secondary | ICD-10-CM | POA: Diagnosis not present

## 2018-01-16 DIAGNOSIS — G8918 Other acute postprocedural pain: Secondary | ICD-10-CM | POA: Diagnosis not present

## 2018-01-16 DIAGNOSIS — M24111 Other articular cartilage disorders, right shoulder: Secondary | ICD-10-CM | POA: Diagnosis not present

## 2018-01-23 ENCOUNTER — Telehealth: Payer: Self-pay | Admitting: Internal Medicine

## 2018-01-23 NOTE — Telephone Encounter (Signed)
Pt and pts wife state he is still having diarrhea like he was when he first saw [place]. Report there was a delay is getting probiotic Nevin Bloodgood ordered and as it turned out the product is not available now anyway-VSL#3. State he has been taking a different probiotic for 3-4 weeks and report he is no better. Requested appt with Dr. Henrene Pastor, scheduled him with first available and 03/12/18 and placed him on wait list. Wife wanted to know what Dr. Henrene Pastor may recommend until his appt. There is an appt with Ellouise Newer PA on Friday and they will take that appt if Dr. Henrene Pastor thinks that is what they need to do. Pt saw Nevin Bloodgood back in April. Please advise.

## 2018-01-23 NOTE — Telephone Encounter (Signed)
Pt's wife Precious Bard called to inform that her husband is still dealing with diarrhea, it has barely improve, she wants to know what else pt can do.

## 2018-01-24 DIAGNOSIS — R6889 Other general symptoms and signs: Secondary | ICD-10-CM | POA: Diagnosis not present

## 2018-01-24 DIAGNOSIS — M7541 Impingement syndrome of right shoulder: Secondary | ICD-10-CM | POA: Diagnosis not present

## 2018-01-24 DIAGNOSIS — M24011 Loose body in right shoulder: Secondary | ICD-10-CM | POA: Diagnosis not present

## 2018-01-24 DIAGNOSIS — M25611 Stiffness of right shoulder, not elsewhere classified: Secondary | ICD-10-CM | POA: Diagnosis not present

## 2018-01-24 NOTE — Telephone Encounter (Signed)
I'm not familiar with this patient. I haven't seen him in many years. Yes, see APP next available

## 2018-01-25 NOTE — Telephone Encounter (Signed)
Pt's wife called back and pt declined to schedule with APP.  He will wait until his appt in Sept with Dr. Henrene Pastor

## 2018-02-05 DIAGNOSIS — M24011 Loose body in right shoulder: Secondary | ICD-10-CM | POA: Diagnosis not present

## 2018-02-05 DIAGNOSIS — M7541 Impingement syndrome of right shoulder: Secondary | ICD-10-CM | POA: Diagnosis not present

## 2018-02-05 DIAGNOSIS — M25611 Stiffness of right shoulder, not elsewhere classified: Secondary | ICD-10-CM | POA: Diagnosis not present

## 2018-02-05 DIAGNOSIS — R6889 Other general symptoms and signs: Secondary | ICD-10-CM | POA: Diagnosis not present

## 2018-02-12 DIAGNOSIS — M7541 Impingement syndrome of right shoulder: Secondary | ICD-10-CM | POA: Diagnosis not present

## 2018-02-12 DIAGNOSIS — R6889 Other general symptoms and signs: Secondary | ICD-10-CM | POA: Diagnosis not present

## 2018-02-12 DIAGNOSIS — M24011 Loose body in right shoulder: Secondary | ICD-10-CM | POA: Diagnosis not present

## 2018-02-12 DIAGNOSIS — M25611 Stiffness of right shoulder, not elsewhere classified: Secondary | ICD-10-CM | POA: Diagnosis not present

## 2018-02-20 DIAGNOSIS — M25611 Stiffness of right shoulder, not elsewhere classified: Secondary | ICD-10-CM | POA: Diagnosis not present

## 2018-02-20 DIAGNOSIS — R6889 Other general symptoms and signs: Secondary | ICD-10-CM | POA: Diagnosis not present

## 2018-02-20 DIAGNOSIS — Z9889 Other specified postprocedural states: Secondary | ICD-10-CM | POA: Diagnosis not present

## 2018-02-21 ENCOUNTER — Encounter

## 2018-02-21 ENCOUNTER — Ambulatory Visit: Payer: BLUE CROSS/BLUE SHIELD | Admitting: Internal Medicine

## 2018-02-21 ENCOUNTER — Encounter: Payer: Self-pay | Admitting: Internal Medicine

## 2018-02-21 VITALS — BP 124/78 | HR 77 | Ht 71.0 in | Wt 182.0 lb

## 2018-02-21 DIAGNOSIS — R197 Diarrhea, unspecified: Secondary | ICD-10-CM

## 2018-02-21 MED ORDER — NA SULFATE-K SULFATE-MG SULF 17.5-3.13-1.6 GM/177ML PO SOLN: 1.0000 | Freq: Once | ORAL | 0 refills | Status: AC

## 2018-02-21 MED ORDER — METRONIDAZOLE 250 MG PO TABS: 250.0000 mg | ORAL_TABLET | Freq: Three times a day (TID) | ORAL | 0 refills | Status: DC

## 2018-02-21 NOTE — Progress Notes (Signed)
HISTORY OF PRESENT ILLNESS:  [name] [name] is a 62 y.o. 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 He is accompanied by his wife. The patient reports a 6 month history of new onset diarrhea. Prior to that he describes 1 or 2 formed bowel movements daily. Since that time he reports between 2 and 4 soft or watery stools per day. The problem is worse in the morning. Has occurred occasionally at night after going to sleep. There is intermittent problems with abdominal cramping and urgency. No bleeding or weight loss. Not obviously exacerbated by meals. No nausea or vomiting. No fevers. Good appetite. He denies travel outside the [place], using well water, or camping. He was treated with doxycycline for approximately one month for prostatitis prior to these issues. He does use sugar free products. He was seen by the GI physician assistant 10/10/2017 regarding this same problem. Issues at that time seemed to be improving. However, issues returned. He has been using V SL #3 without improvement. He did undergo testing for Clostridium difficile in June. This was negative. Remote CT scan 2014 for epigastric pain with nausea and vomiting was negative. His last colonoscopy was performed 07/06/2010. Examination was normal. Review of laboratories from February 2019 finds a unremarkable, red metabolic panel and CBC. Hemoglobin 14.9. He has tried Imodium sparingly, which transiently helps. He denies new medications or change in dietary habits associated with these issues. He is on omeprazole for GERD.   REVIEW OF SYSTEMS:  All non-GI ROS negative except for anxiety, arthritis, depression, urinary leakage  Past Medical History:  Diagnosis Date  . Anxiety   . GERD (gastroesophageal reflux disease)   . History of colon polyps   . History of kidney stones   . History of skin cancer    squamous  . Hyperlipidemia   . Kidney stones   . RBBB    NOTED IN  2006    Past Surgical History:  Procedure Laterality Date  . CARDIOVASCULAR STRESS TEST  09-06-2007  DR Johnsie Cancel   NORMAL NUCLEAR STUDY/  NO ISCHEMIA/  EF 53%  . COLONOSCOPY  2007  POLYPECTOMY/   2012  NEGATIVE   neg 07/06/2099  . CYSTOSCOPY W/ URETERAL STENT REMOVAL Bilateral 07/07/2013   Procedure: CYSTOSCOPY WITH STENT REMOVAL;  Surgeon: Alexis Frock, MD;  Location: [facility];  Service: Urology;  Laterality: Bilateral;  . CYSTOSCOPY WITH RETROGRADE PYELOGRAM, URETEROSCOPY AND STENT PLACEMENT Bilateral 06/18/2013   Procedure: CYSTOSCOPY WITH RETROGRADE PYELOGRAM, URETEROSCOPY AND STENT PLACEMENT;  Surgeon: Alexis Frock, MD;  Location: [facility];  Service: Urology;  Laterality: Bilateral;  . EXTRACORPOREAL SHOCK WAVE LITHOTRIPSY Left JUNE 2009  . HOLMIUM LASER APPLICATION Bilateral 62/95/2841   Procedure: HOLMIUM LASER APPLICATION;  Surgeon: Alexis Frock, MD;  Location: [facility];  Service: Urology;  Laterality: Bilateral;  . KNEE ARTHROSCOPY W/ MENISCAL REPAIR Right MAY 2014  . LEFT URETEROSCOPIC STONE EXTRACTION  04-04-2008  . RETINAL TEAR REPAIR CRYOTHERAPY Left 09/2014   Repaired by laser surgery  . SHOULDER SURGERY Right   . SQUAMOUS CELL CARCINOMA EXCISION Right 07/2014   Had skin graft from in front of ear  . TONSILLECTOMY AND ADENOIDECTOMY  AS CHILD  . UPPER GASTROINTESTINAL ENDOSCOPY  2011   GERD    Social History [name] [name]  reports that he has never smoked. He has never used smokeless tobacco. He reports that he drinks about 14.0 standard drinks of  alcohol per week. He reports that he does not use drugs.  family history includes Cancer in his mother; Crohn's disease in his cousin; Dementia in his mother; Diabetes in his father; Heart disease in his father, maternal grandfather, and maternal grandmother; Hypertension in his mother; Pancreatic cancer in his paternal aunt.  Allergies  Allergen Reactions  . Codeine  Nausea And Vomiting       PHYSICAL EXAMINATION: Vital signs: BP 124/78   Pulse 77   Ht 5\' 11"  (1.803 m)   Wt 182 lb (82.6 kg)   BMI 25.38 kg/m   Constitutional: generally well-appearing, no acute distress Psychiatric: alert and oriented x3, cooperative Eyes: extraocular movements intact, anicteric, conjunctiva pink Mouth: oral pharynx moist, no lesions Neck: supple no lymphadenopathy Cardiovascular: heart regular rate and rhythm, no murmur Lungs: clear to auscultation bilaterally Abdomen: soft, nontender, nondistended, no obvious ascites, no peritoneal signs, normal bowel sounds, no organomegaly Rectal:deferred until colonoscopy Extremities: no clubbing, cyanosis, or lower extremity edema bilaterally Skin: no lesions on visible extremities Neuro: No focal deficits. Cranial nerves intact  ASSESSMENT:  #1. Six-month history of diarrhea. Possible etiologies include postinfectious irritable bowel syndrome, bacterial overgrowth, infection such as Giardia, and microscopic colitis. The diarrhea does not appear to be inflammatory.   PLAN:  #1. Empiric course of metronidazole 250 mg 3 times a day for 7 days. Advised to avoid alcohol during treatment #2. Told to avoid sugar free items as they may exacerbate diarrhea #3. Okay to use Imodium as directed #4. Schedule colonoscopy with biopsies to rule out microscopic colitis or other etiologies.The nature of the procedure, as well as the risks, benefits, and alternatives were carefully and thoroughly reviewed with the patient. Ample time for discussion and questions allowed. The patient understood, was satisfied, and agreed to proceed.  25 minutes spent face-to-face with the patient. Greater than 50% a time use for counseling regarding his issues with chronic diarrhea

## 2018-02-21 NOTE — Patient Instructions (Signed)
We have sent the following medications to your pharmacy for you to pick up at your convenience:  Flagyl  You have been scheduled for a colonoscopy. Please follow written instructions given to you at your visit today.  Please pick up your prep supplies at the pharmacy within the next 1-3 days. If you use inhalers (even only as needed), please bring them with you on the day of your procedure. Your physician has requested that you go to www.startemmi.com and enter the access code given to you at your visit today. This web site gives a general overview about your procedure. However, you should still follow specific instructions given to you by our office regarding your preparation for the procedure.   Try to avoid sugar free items  You may use Imodium as needed

## 2018-02-25 ENCOUNTER — Telehealth: Payer: Self-pay | Admitting: Internal Medicine

## 2018-02-25 ENCOUNTER — Ambulatory Visit (AMBULATORY_SURGERY_CENTER): Payer: BLUE CROSS/BLUE SHIELD | Admitting: Internal Medicine

## 2018-02-25 ENCOUNTER — Encounter: Payer: Self-pay | Admitting: Internal Medicine

## 2018-02-25 VITALS — BP 113/68 | HR 67 | Temp 98.7°F | Resp 20 | Ht 71.0 in | Wt 182.0 lb

## 2018-02-25 DIAGNOSIS — D120 Benign neoplasm of cecum: Secondary | ICD-10-CM

## 2018-02-25 DIAGNOSIS — K635 Polyp of colon: Secondary | ICD-10-CM

## 2018-02-25 DIAGNOSIS — Z1211 Encounter for screening for malignant neoplasm of colon: Secondary | ICD-10-CM | POA: Diagnosis not present

## 2018-02-25 DIAGNOSIS — R197 Diarrhea, unspecified: Secondary | ICD-10-CM

## 2018-02-25 DIAGNOSIS — K52832 Lymphocytic colitis: Secondary | ICD-10-CM | POA: Diagnosis not present

## 2018-02-25 MED ORDER — SODIUM CHLORIDE 0.9 % IV SOLN: 500.0000 mL | Freq: Once | INTRAVENOUS | Status: DC

## 2018-02-25 NOTE — Progress Notes (Signed)
Called to room to assist during endoscopic procedure.  Patient ID and intended procedure confirmed with present staff. Received instructions for my participation in the procedure from the performing physician.  

## 2018-02-25 NOTE — Patient Instructions (Signed)
Handouts given:  Polyps   YOU HAD AN ENDOSCOPIC PROCEDURE TODAY AT THE [facility]:   Refer to the procedure report that was given to you for any specific questions about what was found during the examination.  If the procedure report does not answer your questions, please call your gastroenterologist to clarify.  If you requested that your care partner not be given the details of your procedure findings, then the procedure report has been included in a sealed envelope for you to review at your convenience later.  YOU SHOULD EXPECT: Some feelings of bloating in the abdomen. Passage of more gas than usual.  Walking can help get rid of the air that was put into your GI tract during the procedure and reduce the bloating. If you had a lower endoscopy (such as a colonoscopy or flexible sigmoidoscopy) you may notice spotting of blood in your stool or on the toilet paper. If you underwent a bowel prep for your procedure, you may not have a normal bowel movement for a few days.  Please Note:  You might notice some irritation and congestion in your nose or some drainage.  This is from the oxygen used during your procedure.  There is no need for concern and it should clear up in a day or so.  SYMPTOMS TO REPORT IMMEDIATELY:   Following lower endoscopy (colonoscopy or flexible sigmoidoscopy):  Excessive amounts of blood in the stool  Significant tenderness or worsening of abdominal pains  Swelling of the abdomen that is new, acute  Fever of 100F or higher   For urgent or emergent issues, a gastroenterologist can be reached at any hour by calling [phone].   DIET:  We do recommend a small meal at first, but then you may proceed to your regular diet.  Drink plenty of fluids but you should avoid alcoholic beverages for 24 hours.  ACTIVITY:  You should plan to take it easy for the rest of today and you should NOT DRIVE or use heavy machinery until tomorrow (because of the sedation  medicines used during the test).    FOLLOW UP: Our staff will call the number listed on your records the next business day following your procedure to check on you and address any questions or concerns that you may have regarding the information given to you following your procedure. If we do not reach you, we will leave a message.  However, if you are feeling well and you are not experiencing any problems, there is no need to return our call.  We will assume that you have returned to your regular daily activities without incident.  If any biopsies were taken you will be contacted by phone or by letter within the next 1-3 weeks.  Please call us at [phone] if you have not heard about the biopsies in 3 weeks.    SIGNATURES/CONFIDENTIALITY: You and/or your care partner have signed paperwork which will be entered into your electronic medical record.  These signatures attest to the fact that that the information above on your After Visit Summary has been reviewed and is understood.  Full responsibility of the confidentiality of this discharge information lies with you and/or your care-partner. 

## 2018-02-25 NOTE — Op Note (Signed)
[place] Patient Name: [name] [name] Procedure Date: 02/25/2018 2:53 PM MRN: [id number] Endoscopist: Docia Chuck. Henrene Pastor , MD Age: 62 Referring MD:  Date of Birth: 12/07/1955 Gender: Male Account #: [id number] Procedure:                Colonoscopy, With cold snare polypectomy; biopsies Indications:              Clinically significant diarrhea of unexplained                            origin. Previous examination 2011 negative Medicines:                Monitored Anesthesia Care Procedure:                Pre-Anesthesia Assessment:                           - Prior to the procedure, a History and Physical                            was performed, and patient medications and                            allergies were reviewed. The patient's tolerance of                            previous anesthesia was also reviewed. The risks                            and benefits of the procedure and the sedation                            options and risks were discussed with the patient.                            All questions were answered, and informed consent                            was obtained. Prior Anticoagulants: The patient has                            taken no previous anticoagulant or antiplatelet                            agents. ASA Grade Assessment: II - A patient with                            mild systemic disease. After reviewing the risks                            and benefits, the patient was deemed in                            satisfactory condition to undergo the procedure.  After obtaining informed consent, the colonoscope                            was passed under direct vision. Throughout the                            procedure, the patient's blood pressure, pulse, and                            oxygen saturations were monitored continuously. The                            Colonoscope was introduced through the anus and                  advanced to the the cecum, identified by                            appendiceal orifice and ileocecal valve. The                            terminal ileum, ileocecal valve, appendiceal                            orifice, and rectum were photographed. The quality                            of the bowel preparation was excellent. The                            colonoscopy was performed without difficulty. The                            patient tolerated the procedure well. The bowel                            preparation used was SUPREP. Scope In: 3:11:32 PM Scope Out: 3:28:49 PM Scope Withdrawal Time: 0 hours 13 minutes 41 seconds  Total Procedure Duration: 0 hours 17 minutes 17 seconds  Findings:                 The terminal ileum appeared normal.                           A 6 mm polyp was found in the cecum. The polyp was                            sessile.                           The entire examined colon appeared otherwise normal                            on direct and retroflexion views. Biopsies for  histology were taken with a cold forceps from the                            entire colon for evaluation of microscopic colitis. Complications:            No immediate complications. Estimated blood loss:                            None. Estimated Blood Loss:     Estimated blood loss: none. Impression:               - The examined portion of the ileum was normal.                           - 6 mm sessile cecal polyp removed with cold snare.                            Otherwise normal colonoscopy. Random colon biopsies                            obtained Recommendation:           - Repeat colonoscopy in 5-10 years for                            surveillance, Pending pathology.                           - Patient has a contact number available for                            emergencies. The signs and symptoms of potential                             delayed complications were discussed with the                            patient. Return to normal activities tomorrow.                            Written discharge instructions were provided to the                            patient.                           - Resume previous diet.                           - Continue present medications.                           - Await pathology results Further recommendations                            to follow. Docia Chuck. Henrene Pastor, MD 02/25/2018 3:34:47 PM This report has been signed electronically.

## 2018-02-25 NOTE — Progress Notes (Signed)
Pt's states no medical or surgical changes since previsit or office visit. 

## 2018-02-25 NOTE — Progress Notes (Signed)
A and O x3. Report to RN. Tolerated MAC anesthesia well.

## 2018-02-26 ENCOUNTER — Telehealth: Payer: Self-pay | Admitting: *Deleted

## 2018-02-26 NOTE — Telephone Encounter (Signed)
  Follow up Call-  Call back number 02/25/2018  Post procedure Call Back phone  # [phone]  Permission to leave phone message Yes  Some recent data might be hidden     Patient questions:  Do you have a fever, pain , or abdominal swelling? No. Pain Score  0 *  Have you tolerated food without any problems? Yes.    Have you been able to return to your normal activities? Yes.    Do you have any questions about your discharge instructions: Diet   No. Medications  No. Follow up visit  No.  Do you have questions or concerns about your Care? No.  Actions: * If pain score is 4 or above: No action needed, pain <4.

## 2018-02-28 DIAGNOSIS — M25611 Stiffness of right shoulder, not elsewhere classified: Secondary | ICD-10-CM | POA: Diagnosis not present

## 2018-02-28 DIAGNOSIS — M9901 Segmental and somatic dysfunction of cervical region: Secondary | ICD-10-CM | POA: Diagnosis not present

## 2018-02-28 DIAGNOSIS — M25711 Osteophyte, right shoulder: Secondary | ICD-10-CM | POA: Diagnosis not present

## 2018-02-28 DIAGNOSIS — M25511 Pain in right shoulder: Secondary | ICD-10-CM | POA: Diagnosis not present

## 2018-03-01 ENCOUNTER — Encounter: Payer: Self-pay | Admitting: Internal Medicine

## 2018-03-01 ENCOUNTER — Telehealth: Payer: Self-pay | Admitting: Internal Medicine

## 2018-03-01 ENCOUNTER — Other Ambulatory Visit: Payer: Self-pay

## 2018-03-01 MED ORDER — BUDESONIDE 3 MG PO CPEP: 9.0000 mg | ORAL_CAPSULE | Freq: Every day | ORAL | 3 refills | Status: DC

## 2018-03-01 NOTE — Telephone Encounter (Signed)
See result note.  

## 2018-03-01 NOTE — Telephone Encounter (Signed)
Patient wife calling to discuss results and what medication to start patient on. Patient had colon on 8.19.19. Wife requesting cb from nurse.

## 2018-03-07 DIAGNOSIS — M25611 Stiffness of right shoulder, not elsewhere classified: Secondary | ICD-10-CM | POA: Diagnosis not present

## 2018-03-07 DIAGNOSIS — M9901 Segmental and somatic dysfunction of cervical region: Secondary | ICD-10-CM | POA: Diagnosis not present

## 2018-03-07 DIAGNOSIS — M25711 Osteophyte, right shoulder: Secondary | ICD-10-CM | POA: Diagnosis not present

## 2018-03-07 DIAGNOSIS — M25511 Pain in right shoulder: Secondary | ICD-10-CM | POA: Diagnosis not present

## 2018-03-12 ENCOUNTER — Ambulatory Visit: Payer: BLUE CROSS/BLUE SHIELD | Admitting: Internal Medicine

## 2018-03-21 DIAGNOSIS — M25711 Osteophyte, right shoulder: Secondary | ICD-10-CM | POA: Diagnosis not present

## 2018-03-21 DIAGNOSIS — M9901 Segmental and somatic dysfunction of cervical region: Secondary | ICD-10-CM | POA: Diagnosis not present

## 2018-03-21 DIAGNOSIS — M25611 Stiffness of right shoulder, not elsewhere classified: Secondary | ICD-10-CM | POA: Diagnosis not present

## 2018-03-21 DIAGNOSIS — M25511 Pain in right shoulder: Secondary | ICD-10-CM | POA: Diagnosis not present

## 2018-04-04 DIAGNOSIS — M25511 Pain in right shoulder: Secondary | ICD-10-CM | POA: Diagnosis not present

## 2018-04-04 DIAGNOSIS — M25711 Osteophyte, right shoulder: Secondary | ICD-10-CM | POA: Diagnosis not present

## 2018-04-04 DIAGNOSIS — M25611 Stiffness of right shoulder, not elsewhere classified: Secondary | ICD-10-CM | POA: Diagnosis not present

## 2018-04-04 DIAGNOSIS — M9901 Segmental and somatic dysfunction of cervical region: Secondary | ICD-10-CM | POA: Diagnosis not present

## 2018-04-17 DIAGNOSIS — M9901 Segmental and somatic dysfunction of cervical region: Secondary | ICD-10-CM | POA: Diagnosis not present

## 2018-04-17 DIAGNOSIS — M25711 Osteophyte, right shoulder: Secondary | ICD-10-CM | POA: Diagnosis not present

## 2018-04-17 DIAGNOSIS — M25611 Stiffness of right shoulder, not elsewhere classified: Secondary | ICD-10-CM | POA: Diagnosis not present

## 2018-04-17 DIAGNOSIS — M25511 Pain in right shoulder: Secondary | ICD-10-CM | POA: Diagnosis not present

## 2018-04-23 ENCOUNTER — Encounter

## 2018-04-23 ENCOUNTER — Ambulatory Visit: Payer: BLUE CROSS/BLUE SHIELD | Admitting: Internal Medicine

## 2018-04-23 ENCOUNTER — Encounter: Payer: Self-pay | Admitting: Internal Medicine

## 2018-04-23 VITALS — BP 114/72 | HR 66 | Ht 71.0 in | Wt 184.0 lb

## 2018-04-23 DIAGNOSIS — R197 Diarrhea, unspecified: Secondary | ICD-10-CM

## 2018-04-23 DIAGNOSIS — K52832 Lymphocytic colitis: Secondary | ICD-10-CM

## 2018-04-23 DIAGNOSIS — D120 Benign neoplasm of cecum: Secondary | ICD-10-CM | POA: Diagnosis not present

## 2018-04-23 MED ORDER — BUDESONIDE 3 MG PO CPEP: 9.0000 mg | ORAL_CAPSULE | Freq: Every day | ORAL | 3 refills | Status: DC

## 2018-04-23 NOTE — Progress Notes (Signed)
HISTORY OF PRESENT ILLNESS:  [name] [name] is a 62 y.o. 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  See that dictation for details.  He was treated empirically with metronidazole but did not respond.  He subsequently underwent complete colonoscopy February 25, 2018.  The ileum was normal.  A 6 mm sessile serrated polyp without dysplasia was removed from the cecum.  The remainder of the colon was grossly normal.  However, random colon biopsies revealed lymphocytic colitis.  He was initiated on budesonide 9 mg daily.  He follows up at this time as requested.  Patient tells me that approximately 1 week after initiating therapy his bowel habits significantly improved.  Currently back to normal with 1 or 2 formed bowel movements per day.  He is now completing his second month of therapy.  No appreciable side effects or other issues.  REVIEW OF SYSTEMS:  All non-GI ROS negative unless otherwise stated in the HPI except for arthritis, depression  Past Medical History:  Diagnosis Date  . Anxiety   . GERD (gastroesophageal reflux disease)   . History of colon polyps   . History of kidney stones   . History of skin cancer    squamous  . Hyperlipidemia   . Kidney stones   . RBBB    NOTED IN 2006    Past Surgical History:  Procedure Laterality Date  . CARDIOVASCULAR STRESS TEST  09-06-2007  DR Johnsie Cancel   NORMAL NUCLEAR STUDY/  NO ISCHEMIA/  EF 53%  . COLONOSCOPY  2007  POLYPECTOMY/   2012  NEGATIVE   neg 07/06/2099  . CYSTOSCOPY W/ URETERAL STENT REMOVAL Bilateral 07/07/2013   Procedure: CYSTOSCOPY WITH STENT REMOVAL;  Surgeon: Alexis Frock, MD;  Location: [facility];  Service: Urology;  Laterality: Bilateral;  . CYSTOSCOPY WITH RETROGRADE PYELOGRAM, URETEROSCOPY AND STENT PLACEMENT Bilateral 06/18/2013   Procedure: CYSTOSCOPY WITH RETROGRADE PYELOGRAM, URETEROSCOPY AND STENT PLACEMENT;  Surgeon:  Alexis Frock, MD;  Location: [facility];  Service: Urology;  Laterality: Bilateral;  . EXTRACORPOREAL SHOCK WAVE LITHOTRIPSY Left JUNE 2009  . HOLMIUM LASER APPLICATION Bilateral 46/27/0350   Procedure: HOLMIUM LASER APPLICATION;  Surgeon: Alexis Frock, MD;  Location: [facility];  Service: Urology;  Laterality: Bilateral;  . KNEE ARTHROSCOPY W/ MENISCAL REPAIR Right MAY 2014  . LEFT URETEROSCOPIC STONE EXTRACTION  04-04-2008  . RETINAL TEAR REPAIR CRYOTHERAPY Left 09/2014   Repaired by laser surgery  . SHOULDER SURGERY Right   . SQUAMOUS CELL CARCINOMA EXCISION Right 07/2014   Had skin graft from in front of ear  . TONSILLECTOMY AND ADENOIDECTOMY  AS CHILD  . UPPER GASTROINTESTINAL ENDOSCOPY  2011   GERD    Social History ENIO HORNBACK  reports that he has never smoked. He has never used smokeless tobacco. He reports that he drinks about 14.0 standard drinks of alcohol per week. He reports that he does not use drugs.  family history includes Cancer in his mother; Crohn's disease in his cousin; Dementia in his mother; Diabetes in his father; Heart disease in his father, maternal grandfather, and maternal grandmother; Hypertension in his mother; Pancreatic cancer in his paternal aunt.  Allergies  Allergen Reactions  . Codeine Nausea And Vomiting       PHYSICAL EXAMINATION: Vital signs: BP 114/72   Pulse 66   Ht 5\' 11"  (1.803 m)   Wt 184 lb (83.5 kg)  BMI 25.66 kg/m   Constitutional: generally well-appearing, no acute distress Psychiatric: alert and oriented x3, cooperative Eyes: anicteric,  Abdomen: Not reexamined Skin: no lesions on visible extremities Neuro: Alert and oriented  ASSESSMENT:  1.  Lymphocytic colitis.  Nice response to budesonide 9 mg daily 2.  Sessile serrated polyp without dysplasia   PLAN:  1.  Continue budesonide 9 mg daily for an additional month (3 months total).  Then decrease to 6 mg daily for 1 month.  If  doing well then decrease to 3 mg daily for 1 month.  If doing well then discontinue 2.  Office follow-up 3 months.  The patient has been instructed to contact the office in the interim for any questions or problems 3.  Routine surveillance colonoscopy 5 years  15 minutes spent face-to-face with the patient.  The majority of the time was spent counseling regarding lymphocytic colitis, its management outcomes,, and follow-up

## 2018-04-23 NOTE — Patient Instructions (Addendum)
Take your 3rd month of Budesonide - 9mg  a day.  Then 6 mg a day for one month, then 3mg  a day for one month, then stop  Please follow up with Dr. Henrene Pastor in 3-4 months

## 2018-04-25 DIAGNOSIS — Z85828 Personal history of other malignant neoplasm of skin: Secondary | ICD-10-CM | POA: Diagnosis not present

## 2018-04-25 DIAGNOSIS — Z08 Encounter for follow-up examination after completed treatment for malignant neoplasm: Secondary | ICD-10-CM | POA: Diagnosis not present

## 2018-04-25 DIAGNOSIS — D225 Melanocytic nevi of trunk: Secondary | ICD-10-CM | POA: Diagnosis not present

## 2018-04-25 DIAGNOSIS — D2261 Melanocytic nevi of right upper limb, including shoulder: Secondary | ICD-10-CM | POA: Diagnosis not present

## 2018-05-24 ENCOUNTER — Other Ambulatory Visit: Payer: Self-pay | Admitting: Internal Medicine

## 2018-05-28 DIAGNOSIS — Z9889 Other specified postprocedural states: Secondary | ICD-10-CM | POA: Diagnosis not present

## 2018-05-28 DIAGNOSIS — M7521 Bicipital tendinitis, right shoulder: Secondary | ICD-10-CM | POA: Diagnosis not present

## 2018-06-11 DIAGNOSIS — F411 Generalized anxiety disorder: Secondary | ICD-10-CM | POA: Diagnosis not present

## 2018-06-11 DIAGNOSIS — F330 Major depressive disorder, recurrent, mild: Secondary | ICD-10-CM | POA: Diagnosis not present

## 2018-07-24 ENCOUNTER — Other Ambulatory Visit: Payer: Self-pay | Admitting: Internal Medicine

## 2018-07-30 ENCOUNTER — Telehealth: Payer: Self-pay

## 2018-07-30 DIAGNOSIS — Z0000 Encounter for general adult medical examination without abnormal findings: Secondary | ICD-10-CM

## 2018-07-30 NOTE — Telephone Encounter (Signed)
Notified patient.

## 2018-07-30 NOTE — Telephone Encounter (Signed)
CPE orders have been entered  Copied from [place] [phone]. Topic: General - Other >> Jul 30, 2018  9:23 AM Antonieta Iba C wrote: Reason for CRM: pt would like to come in before his CPE to have his lab completed instead of waiting for his CPE, is this okay? If so, please advise pt and place orders.    CB: [phone]

## 2018-09-04 ENCOUNTER — Other Ambulatory Visit (INDEPENDENT_AMBULATORY_CARE_PROVIDER_SITE_OTHER): Payer: BLUE CROSS/BLUE SHIELD

## 2018-09-04 DIAGNOSIS — Z125 Encounter for screening for malignant neoplasm of prostate: Secondary | ICD-10-CM

## 2018-09-04 DIAGNOSIS — Z0000 Encounter for general adult medical examination without abnormal findings: Secondary | ICD-10-CM | POA: Diagnosis not present

## 2018-09-04 LAB — URINALYSIS, ROUTINE W REFLEX MICROSCOPIC
Bilirubin Urine: NEGATIVE
Leukocytes,Ua: NEGATIVE
Nitrite: NEGATIVE
PH: 6 (ref 5.0–8.0)
Specific Gravity, Urine: >=1.03 — AB (ref 1.000–1.030)
Total Protein, Urine: NEGATIVE
Urine Glucose: NEGATIVE
Urobilinogen, UA: 0.2 (ref 0.0–1.0)

## 2018-09-04 LAB — HEPATIC FUNCTION PANEL
ALT: 33 U/L (ref 0–53)
AST: 27 U/L (ref 0–37)
Albumin: 4.4 g/dL (ref 3.5–5.2)
Alkaline Phosphatase: 69 U/L (ref 39–117)
Bilirubin, Direct: 0.1 mg/dL (ref 0.0–0.3)
Total Bilirubin: 0.5 mg/dL (ref 0.2–1.2)
Total Protein: 6.5 g/dL (ref 6.0–8.3)

## 2018-09-04 LAB — LIPID PANEL
CHOL/HDL RATIO: 3
Cholesterol: 165 mg/dL (ref 0–200)
HDL: 51.8 mg/dL (ref >39.00)
LDL Cholesterol: 94 mg/dL (ref 0–99)
NonHDL: 112.95
Triglycerides: 96 mg/dL (ref 0.0–149.0)
VLDL: 19.2 mg/dL (ref 0.0–40.0)

## 2018-09-04 LAB — CBC WITH DIFFERENTIAL/PLATELET
Basophils Absolute: 0.1 10*3/uL (ref 0.0–0.1)
Basophils Relative: 0.7 % (ref 0.0–3.0)
Eosinophils Absolute: 0.2 10*3/uL (ref 0.0–0.7)
Eosinophils Relative: 1.8 % (ref 0.0–5.0)
HCT: 44.9 % (ref 39.0–52.0)
HEMOGLOBIN: 15.3 g/dL (ref 13.0–17.0)
Lymphocytes Relative: 17.4 % (ref 12.0–46.0)
Lymphs Abs: 1.7 10*3/uL (ref 0.7–4.0)
MCHC: 34 g/dL (ref 30.0–36.0)
MCV: 88.9 fl (ref 78.0–100.0)
MONOS PCT: 8.4 % (ref 3.0–12.0)
Monocytes Absolute: 0.8 10*3/uL (ref 0.1–1.0)
Neutro Abs: 7.1 10*3/uL (ref 1.4–7.7)
Neutrophils Relative %: 71.7 % (ref 43.0–77.0)
Platelets: 247 10*3/uL (ref 150.0–400.0)
RBC: 5.06 Mil/uL (ref 4.22–5.81)
RDW: 12.9 % (ref 11.5–15.5)
WBC: 9.9 10*3/uL (ref 4.0–10.5)

## 2018-09-04 LAB — BASIC METABOLIC PANEL
BUN: 29 mg/dL — AB (ref 6–23)
CO2: 28 mEq/L (ref 19–32)
Calcium: 9.4 mg/dL (ref 8.4–10.5)
Chloride: 101 mEq/L (ref 96–112)
Creatinine, Ser: 1.01 mg/dL (ref 0.40–1.50)
GFR: 74.54 mL/min (ref >60.00)
Glucose, Bld: 92 mg/dL (ref 70–99)
Potassium: 3.7 mEq/L (ref 3.5–5.1)
Sodium: 139 mEq/L (ref 135–145)

## 2018-09-05 LAB — TSH: TSH: 2.57 u[IU]/mL (ref 0.35–4.50)

## 2018-09-05 LAB — PSA: PSA: 1.69 ng/mL (ref 0.10–4.00)

## 2018-09-11 ENCOUNTER — Other Ambulatory Visit: Payer: Self-pay | Admitting: *Deleted

## 2018-09-11 ENCOUNTER — Ambulatory Visit (INDEPENDENT_AMBULATORY_CARE_PROVIDER_SITE_OTHER)
Admission: RE | Admit: 2018-09-11 | Discharge: 2018-09-11 | Disposition: A | Discharge status: Home / Self Care | Payer: BLUE CROSS/BLUE SHIELD | Source: Ambulatory Visit | Attending: Internal Medicine | Admitting: Internal Medicine

## 2018-09-11 ENCOUNTER — Encounter: Payer: Self-pay | Admitting: Internal Medicine

## 2018-09-11 ENCOUNTER — Other Ambulatory Visit (INDEPENDENT_AMBULATORY_CARE_PROVIDER_SITE_OTHER): Payer: BLUE CROSS/BLUE SHIELD

## 2018-09-11 ENCOUNTER — Telehealth: Payer: Self-pay | Admitting: Internal Medicine

## 2018-09-11 ENCOUNTER — Ambulatory Visit (INDEPENDENT_AMBULATORY_CARE_PROVIDER_SITE_OTHER): Payer: BLUE CROSS/BLUE SHIELD | Admitting: Internal Medicine

## 2018-09-11 VITALS — BP 116/78 | HR 76 | Temp 97.8°F | Ht 71.0 in | Wt 187.0 lb

## 2018-09-11 DIAGNOSIS — Z0001 Encounter for general adult medical examination with abnormal findings: Secondary | ICD-10-CM

## 2018-09-11 DIAGNOSIS — R0600 Dyspnea, unspecified: Secondary | ICD-10-CM

## 2018-09-11 DIAGNOSIS — N529 Male erectile dysfunction, unspecified: Secondary | ICD-10-CM

## 2018-09-11 DIAGNOSIS — R0602 Shortness of breath: Secondary | ICD-10-CM | POA: Diagnosis not present

## 2018-09-11 LAB — BRAIN NATRIURETIC PEPTIDE: Pro B Natriuretic peptide (BNP): 19 pg/mL (ref 0.0–100.0)

## 2018-09-11 MED ORDER — SILDENAFIL CITRATE 100 MG PO TABS: 50.0000 mg | ORAL_TABLET | Freq: Every day | ORAL | 11 refills | Status: AC | PRN

## 2018-09-11 MED ORDER — SILDENAFIL CITRATE 100 MG PO TABS: 50.0000 mg | ORAL_TABLET | Freq: Every day | ORAL | 11 refills | Status: DC | PRN

## 2018-09-11 NOTE — Assessment & Plan Note (Signed)

## 2018-09-11 NOTE — Progress Notes (Signed)
Rerouted to local pharmacy per patient request

## 2018-09-11 NOTE — Addendum Note (Signed)
Addended by: Juliet Rude on: 09/11/2018 09:54 AM   Modules accepted: Orders

## 2018-09-11 NOTE — Patient Instructions (Addendum)
Your EKG was OK today  Please take all new medication as prescribed - the viagra as needed  Please continue all other medications as before, and refills have been done if requested.  Please have the pharmacy call with any other refills you may need.  Please continue your efforts at being more active, low cholesterol diet, and weight control.  You are otherwise up to date with prevention measures today.  Please keep your appointments with your specialists as you may have planned  Please go to the XRAY Department in the Basement (go straight as you get off the elevator) for the x-ray testing  Please go to the LAB in the Basement (turn left off the elevator) for the tests to be done today - just the "BNP" today  You will be contacted regarding the referral for: PFT's (pulmonary function testing), and Echocardiogram  You will be contacted by phone if any changes need to be made immediately.  Otherwise, you will receive a letter about your results with an explanation, but please check with MyChart first.  Please remember to sign up for MyChart if you have not done so, as this will be important to you in the future with finding out test results, communicating by private email, and scheduling acute appointments online when needed.  Please return in 1 year for your yearly visit, or sooner if needed, with Lab testing done 3-5 days before

## 2018-09-11 NOTE — Progress Notes (Addendum)
Subjective:    Patient ID: [name] [name], male    DOB: Sep 14, 1955, 63 y.o.   MRN: [id number]  HPI  Here for wellness and f/u;  Overall doing ok;  Pt denies Chest pain, wheezing, orthopnea but has had some unusual dypsnea with exertion, had to stop twice at the gym, and aft4r 20 min yard work, PND, worsening LE edema, palpitations, dizziness or syncope.  Pt denies neurological change such as new headache, facial or extremity weakness.  Pt denies polydipsia, polyuria, or low sugar symptoms. Pt states overall good compliance with treatment and medications, good tolerability, and has been trying to follow appropriate diet.  Pt denies worsening depressive symptoms, suicidal ideation or panic. No fever, night sweats, wt loss, loss of appetite, or other constitutional symptoms.  Pt states good ability with ADL's, has low fall risk, home safety reviewed and adequate, no other significant changes in hearing or vision, and only occasionally active with exercise.  Also with worsening ED symptoms recently in last 6 mo, asks for viagra refill.  No recent kidney stones active.  S/p right shoulder surgury last   Also c/o dyspnea - see above Past Medical History:  Diagnosis Date  . Anxiety   . GERD (gastroesophageal reflux disease)   . History of colon polyps   . History of kidney stones   . History of skin cancer    squamous  . Hyperlipidemia   . Kidney stones   . RBBB    NOTED IN 2006   Past Surgical History:  Procedure Laterality Date  . CARDIOVASCULAR STRESS TEST  09-06-2007  DR Johnsie Cancel   NORMAL NUCLEAR STUDY/  NO ISCHEMIA/  EF 53%  . COLONOSCOPY  2007  POLYPECTOMY/   2012  NEGATIVE   neg 07/06/2099  . CYSTOSCOPY W/ URETERAL STENT REMOVAL Bilateral 07/07/2013   Procedure: CYSTOSCOPY WITH STENT REMOVAL;  Surgeon: Alexis Frock, MD;  Location[facility];  Service: Urology;  Laterality: Bilateral;  . CYSTOSCOPY WITH RETROGRADE PYELOGRAM, URETEROSCOPY AND STENT PLACEMENT Bilateral  06/18/2013   Procedure: CYSTOSCOPY WITH RETROGRADE PYELOGRAM, URETEROSCOPY AND STENT PLACEMENT;  Surgeon: Alexis Frock, MD;  Location: [facility];  Service: Urology;  Laterality: Bilateral;  . EXTRACORPOREAL SHOCK WAVE LITHOTRIPSY Left JUNE 2009  . HOLMIUM LASER APPLICATION Bilateral 19/50/9326   Procedure: HOLMIUM LASER APPLICATION;  Surgeon: Alexis Frock, MD;  Location: [facility];  Service: Urology;  Laterality: Bilateral;  . KNEE ARTHROSCOPY W/ MENISCAL REPAIR Right MAY 2014  . LEFT URETEROSCOPIC STONE EXTRACTION  04-04-2008  . RETINAL TEAR REPAIR CRYOTHERAPY Left 09/2014   Repaired by laser surgery  . SHOULDER SURGERY Right   . SQUAMOUS CELL CARCINOMA EXCISION Right 07/2014   Had skin graft from in front of ear  . TONSILLECTOMY AND ADENOIDECTOMY  AS CHILD  . UPPER GASTROINTESTINAL ENDOSCOPY  2011   GERD    reports that he has never smoked. He has never used smokeless tobacco. He reports current alcohol use of about 14.0 standard drinks of alcohol per week. He reports that he does not use drugs. family history includes Cancer in his mother; Crohn's disease in his cousin; Dementia in his mother; Diabetes in his father; Heart disease in his father, maternal grandfather, and maternal grandmother; Hypertension in his mother; Pancreatic cancer in his paternal aunt. Allergies  Allergen Reactions  . Codeine Nausea And Vomiting   Current Outpatient Medications on File Prior to Visit  Medication Sig Dispense Refill  . aspirin 81 MG tablet Take  81 mg by mouth daily.    Marland Kitchen buPROPion (WELLBUTRIN XL) 300 MG 24 hr tablet Take 300 mg by mouth daily.    . indapamide (LOZOL) 2.5 MG tablet Take 2.5 mg by mouth daily.    Marland Kitchen LORazepam (ATIVAN) 1 MG tablet Take 1 mg by mouth 2 (two) times daily.     Marland Kitchen omeprazole (PRILOSEC) 20 MG capsule Take 1 capsule (20 mg total) by mouth daily. PT NEEDS ANNUAL APPT FOR REFILLS 90 capsule 0  . rosuvastatin (CRESTOR) 20 MG tablet Take 1  tablet (20 mg total) by mouth daily. 90 tablet 3  . TURMERIC PO Take 2,000 mg by mouth daily.    Marland Kitchen zolpidem (AMBIEN) 10 MG tablet Take 10 mg by mouth at bedtime as needed for sleep.     No current facility-administered medications on file prior to visit.    Review of Systems Constitutional: Negative for other unusual diaphoresis, sweats, appetite or weight changes HENT: Negative for other worsening hearing loss, ear pain, facial swelling, mouth sores or neck stiffness.   Eyes: Negative for other worsening pain, redness or other visual disturbance.  Respiratory: Negative for other stridor or swelling Cardiovascular: Negative for other palpitations or other chest pain  Gastrointestinal: Negative for worsening diarrhea or loose stools, blood in stool, distention or other pain Genitourinary: Negative for hematuria, flank pain or other change in urine volume.  Musculoskeletal: Negative for myalgias or other joint swelling.  Skin: Negative for other color change, or other wound or worsening drainage.  Neurological: Negative for other syncope or numbness. Hematological: Negative for other adenopathy or swelling Psychiatric/Behavioral: Negative for hallucinations, other worsening agitation, SI, self-injury, or new decreased concentration All other system neg per pt    Objective:   Physical Exam BP 116/78   Pulse 76   Temp 97.8 F (36.6 C) (Oral)   Ht 5\' 11"  (1.803 m)   Wt 187 lb (84.8 kg)   SpO2 96%   BMI 26.08 kg/m  VS noted,  Constitutional: Pt appears in NAD HENT: Head: NCAT.  Right Ear: External ear normal.  Left Ear: External ear normal.  Eyes: . Pupils are equal, round, and reactive to light. Conjunctivae and EOM are normal Nose: without d/c or deformity Neck: Neck supple. Gross normal ROM Cardiovascular: Normal rate and regular rhythm.   Pulmonary/Chest: Effort normal and breath sounds without rales or wheezing.  Abd:  Soft, NT, ND, + BS, no organomegaly Neurological: Pt is  alert. At baseline orientation, motor grossly intact Skin: Skin is warm. No rashes, other new lesions, no LE edema Psychiatric: Pt behavior is normal without agitation  No other exam findings Lab Results  Component Value Date   WBC 9.9 09/04/2018   HGB 15.3 09/04/2018   HCT 44.9 09/04/2018   PLT 247.0 09/04/2018   GLUCOSE 92 09/04/2018   CHOL 165 09/04/2018   TRIG 96.0 09/04/2018   HDL 51.80 09/04/2018   LDLDIRECT 130.7 10/16/2011   LDLCALC 94 09/04/2018   ALT 33 09/04/2018   AST 27 09/04/2018   NA 139 09/04/2018   K 3.7 09/04/2018   CL 101 09/04/2018   CREATININE 1.01 09/04/2018   BUN 29 (H) 09/04/2018   CO2 28 09/04/2018   TSH 2.57 09/04/2018   PSA 1.69 09/04/2018   HGBA1C 5.4 09/04/2017  ECG today I have personally interpreted?  SR with RBBB    Assessment & Plan:

## 2018-09-11 NOTE — Assessment & Plan Note (Signed)
For viagra prn 

## 2018-09-11 NOTE — Telephone Encounter (Signed)
Rerouted Rx per patient request

## 2018-09-11 NOTE — Assessment & Plan Note (Addendum)
Etiology unclear, ecg reviewed today, for cxr, BNP, FPT's and Echo, consider cardiology or pulm referral if abnormal  In addition to the time spent performing CPE, I spent an additional 25 minutes face to face,in which greater than 50% of this time was spent in counseling and coordination of care for patient's acute illness as documented, including the differential dx, treatment, further evaluation and other management of dsypnea and ED

## 2018-09-11 NOTE — Addendum Note (Signed)
Addended by: Biagio Borg on: 09/11/2018 09:54 AM   Modules accepted: Orders

## 2018-09-11 NOTE — Telephone Encounter (Unsigned)
Copied from [place] [id number]. Topic: General - Other >> Sep 11, 2018 10:56 AM Carolyn Stare wrote:  RX was sent to the wrong pharmacy and that the RX be resent    sildenafil (VIAGRA) 100 MG tablet  [facility]

## 2018-09-12 ENCOUNTER — Ambulatory Visit (HOSPITAL_COMMUNITY): Payer: BLUE CROSS/BLUE SHIELD | Attending: Cardiovascular Disease

## 2018-09-12 ENCOUNTER — Other Ambulatory Visit: Payer: Self-pay | Admitting: Internal Medicine

## 2018-09-12 DIAGNOSIS — R0600 Dyspnea, unspecified: Secondary | ICD-10-CM | POA: Insufficient documentation

## 2018-09-15 ENCOUNTER — Encounter: Payer: Self-pay | Admitting: Internal Medicine

## 2018-09-30 DIAGNOSIS — N200 Calculus of kidney: Secondary | ICD-10-CM | POA: Diagnosis not present

## 2018-09-30 DIAGNOSIS — Z125 Encounter for screening for malignant neoplasm of prostate: Secondary | ICD-10-CM | POA: Diagnosis not present

## 2018-10-07 DIAGNOSIS — R310 Gross hematuria: Secondary | ICD-10-CM | POA: Diagnosis not present

## 2018-10-07 DIAGNOSIS — N5201 Erectile dysfunction due to arterial insufficiency: Secondary | ICD-10-CM | POA: Diagnosis not present

## 2018-10-07 DIAGNOSIS — N200 Calculus of kidney: Secondary | ICD-10-CM | POA: Diagnosis not present

## 2018-10-20 ENCOUNTER — Other Ambulatory Visit: Payer: Self-pay | Admitting: Internal Medicine

## 2018-12-04 DIAGNOSIS — F330 Major depressive disorder, recurrent, mild: Secondary | ICD-10-CM | POA: Diagnosis not present

## 2018-12-04 DIAGNOSIS — F411 Generalized anxiety disorder: Secondary | ICD-10-CM | POA: Diagnosis not present

## 2019-05-01 DIAGNOSIS — D1801 Hemangioma of skin and subcutaneous tissue: Secondary | ICD-10-CM | POA: Diagnosis not present

## 2019-05-01 DIAGNOSIS — L578 Other skin changes due to chronic exposure to nonionizing radiation: Secondary | ICD-10-CM | POA: Diagnosis not present

## 2019-05-01 DIAGNOSIS — Z85828 Personal history of other malignant neoplasm of skin: Secondary | ICD-10-CM | POA: Diagnosis not present

## 2019-05-01 DIAGNOSIS — Z08 Encounter for follow-up examination after completed treatment for malignant neoplasm: Secondary | ICD-10-CM | POA: Diagnosis not present

## 2019-05-29 DIAGNOSIS — F330 Major depressive disorder, recurrent, mild: Secondary | ICD-10-CM | POA: Diagnosis not present

## 2019-05-29 DIAGNOSIS — F411 Generalized anxiety disorder: Secondary | ICD-10-CM | POA: Diagnosis not present

## 2019-07-29 ENCOUNTER — Encounter: Payer: Self-pay | Admitting: Internal Medicine

## 2019-08-01 ENCOUNTER — Other Ambulatory Visit (INDEPENDENT_AMBULATORY_CARE_PROVIDER_SITE_OTHER): Payer: BC Managed Care – PPO

## 2019-08-01 ENCOUNTER — Other Ambulatory Visit: Payer: Self-pay | Admitting: *Deleted

## 2019-08-01 DIAGNOSIS — Z125 Encounter for screening for malignant neoplasm of prostate: Secondary | ICD-10-CM | POA: Diagnosis not present

## 2019-08-01 DIAGNOSIS — Z0001 Encounter for general adult medical examination with abnormal findings: Secondary | ICD-10-CM

## 2019-08-01 DIAGNOSIS — Z0000 Encounter for general adult medical examination without abnormal findings: Secondary | ICD-10-CM | POA: Diagnosis not present

## 2019-08-01 LAB — HEPATIC FUNCTION PANEL
ALT: 16 U/L (ref 0–53)
AST: 17 U/L (ref 0–37)
Albumin: 4.5 g/dL (ref 3.5–5.2)
Alkaline Phosphatase: 62 U/L (ref 39–117)
Bilirubin, Direct: 0.1 mg/dL (ref 0.0–0.3)
Total Bilirubin: 0.6 mg/dL (ref 0.2–1.2)
Total Protein: 6.6 g/dL (ref 6.0–8.3)

## 2019-08-01 LAB — LIPID PANEL
Cholesterol: 175 mg/dL (ref 0–200)
HDL: 61.5 mg/dL (ref >39.00)
LDL Cholesterol: 96 mg/dL (ref 0–99)
NonHDL: 113.18
Total CHOL/HDL Ratio: 3
Triglycerides: 85 mg/dL (ref 0.0–149.0)
VLDL: 17 mg/dL (ref 0.0–40.0)

## 2019-08-01 LAB — CBC WITH DIFFERENTIAL/PLATELET
Basophils Absolute: 0.1 10*3/uL (ref 0.0–0.1)
Basophils Relative: 0.8 % (ref 0.0–3.0)
Eosinophils Absolute: 0.1 10*3/uL (ref 0.0–0.7)
Eosinophils Relative: 1.4 % (ref 0.0–5.0)
HCT: 45.4 % (ref 39.0–52.0)
Hemoglobin: 15.1 g/dL (ref 13.0–17.0)
Lymphocytes Relative: 22.1 % (ref 12.0–46.0)
Lymphs Abs: 1.7 10*3/uL (ref 0.7–4.0)
MCHC: 33.3 g/dL (ref 30.0–36.0)
MCV: 90.8 fl (ref 78.0–100.0)
Monocytes Absolute: 0.8 10*3/uL (ref 0.1–1.0)
Monocytes Relative: 10 % (ref 3.0–12.0)
Neutro Abs: 4.9 10*3/uL (ref 1.4–7.7)
Neutrophils Relative %: 65.7 % (ref 43.0–77.0)
Platelets: 229 10*3/uL (ref 150.0–400.0)
RBC: 5 Mil/uL (ref 4.22–5.81)
RDW: 12.9 % (ref 11.5–15.5)
WBC: 7.5 10*3/uL (ref 4.0–10.5)

## 2019-08-01 LAB — URINALYSIS, ROUTINE W REFLEX MICROSCOPIC
Ketones, ur: NEGATIVE
Nitrite: NEGATIVE
Specific Gravity, Urine: >=1.03 — AB (ref 1.000–1.030)
Total Protein, Urine: NEGATIVE
Urine Glucose: NEGATIVE
Urobilinogen, UA: 1 (ref 0.0–1.0)
pH: 6 (ref 5.0–8.0)

## 2019-08-01 LAB — BASIC METABOLIC PANEL
BUN: 25 mg/dL — ABNORMAL HIGH (ref 6–23)
CO2: 30 mEq/L (ref 19–32)
Calcium: 9.8 mg/dL (ref 8.4–10.5)
Chloride: 97 mEq/L (ref 96–112)
Creatinine, Ser: 1.07 mg/dL (ref 0.40–1.50)
GFR: 69.54 mL/min (ref >60.00)
Glucose, Bld: 80 mg/dL (ref 70–99)
Potassium: 3.8 mEq/L (ref 3.5–5.1)
Sodium: 137 mEq/L (ref 135–145)

## 2019-08-01 LAB — TSH: TSH: 1.8 u[IU]/mL (ref 0.35–4.50)

## 2019-08-01 LAB — PSA: PSA: 1.04 ng/mL (ref 0.10–4.00)

## 2019-08-06 ENCOUNTER — Encounter: Payer: Self-pay | Admitting: Internal Medicine

## 2019-08-06 ENCOUNTER — Other Ambulatory Visit: Payer: Self-pay

## 2019-08-06 ENCOUNTER — Ambulatory Visit (INDEPENDENT_AMBULATORY_CARE_PROVIDER_SITE_OTHER): Payer: BC Managed Care – PPO | Admitting: Internal Medicine

## 2019-08-06 VITALS — BP 116/72 | HR 65 | Temp 97.7°F | Ht 71.0 in | Wt 174.4 lb

## 2019-08-06 DIAGNOSIS — Z0000 Encounter for general adult medical examination without abnormal findings: Secondary | ICD-10-CM | POA: Diagnosis not present

## 2019-08-06 DIAGNOSIS — R739 Hyperglycemia, unspecified: Secondary | ICD-10-CM

## 2019-08-06 DIAGNOSIS — R0600 Dyspnea, unspecified: Secondary | ICD-10-CM | POA: Diagnosis not present

## 2019-08-06 NOTE — Assessment & Plan Note (Signed)
stable overall by history and exam, recent data reviewed with pt, and pt to continue medical treatment as before,  to f/u any worsening symptoms or concerns  

## 2019-08-06 NOTE — Assessment & Plan Note (Signed)

## 2019-08-06 NOTE — Assessment & Plan Note (Signed)
Exam benign, mild intermittent, long standing problem o/w unexplained, for PFTs

## 2019-08-06 NOTE — Patient Instructions (Signed)
You will be contacted regarding the referral for: Pulmonary Function tests  Please continue all other medications as before, and refills have been done if requested.  Please have the pharmacy call with any other refills you may need.  Please continue your efforts at being more active, low cholesterol diet, and weight control.  You are otherwise up to date with prevention measures today.  Please keep your appointments with your specialists as you may have planned  Please make an Appointment to return for your 1 year visit, or sooner if needed, with Lab testing by Appointment as well, to be done about 3-5 days before at the [place] (so this is for TWO appointments - please see the scheduling desk as you leave)

## 2019-08-06 NOTE — Progress Notes (Signed)
Subjective:    Patient ID: [name] [name], male    DOB: 05-14-1956, 64 y.o.   MRN: [id number]  HPI  Here for wellness and f/u;  Overall doing ok;  Pt denies Chest pain, worsening SOB, DOE, wheezing, orthopnea, PND, worsening LE edema, palpitations, dizziness or syncope, except for intermittent episodes of Dyspnea out of proportion to exertion such as with his personal traniner; had episode yesterday, but none today. Lat echo with normal EF.  Pt denies neurological change such as new headache, facial or extremity weakness.  Pt denies polydipsia, polyuria, or low sugar symptoms. Pt states overall good compliance with treatment and medications, good tolerability, and has been trying to follow appropriate diet.  Pt denies worsening depressive symptoms, suicidal ideation or panic. No fever, night sweats, wt loss, loss of appetite, or other constitutional symptoms.  Pt states good ability with ADL's, has low fall risk, home safety reviewed and adequate, no other significant changes in hearing or vision, and active with exercise at the gym most days Past Medical History:  Diagnosis Date  . Anxiety   . GERD (gastroesophageal reflux disease)   . History of colon polyps   . History of kidney stones   . History of skin cancer    squamous  . Hyperlipidemia   . Kidney stones   . RBBB    NOTED IN 2006   Past Surgical History:  Procedure Laterality Date  . CARDIOVASCULAR STRESS TEST  09-06-2007  DR Johnsie Cancel   NORMAL NUCLEAR STUDY/  NO ISCHEMIA/  EF 53%  . COLONOSCOPY  2007  POLYPECTOMY/   2012  NEGATIVE   neg 07/06/2099  . CYSTOSCOPY W/ URETERAL STENT REMOVAL Bilateral 07/07/2013   Procedure: CYSTOSCOPY WITH STENT REMOVAL;  Surgeon: Alexis Frock, MD;  Location: [facility];  Service: Urology;  Laterality: Bilateral;  . CYSTOSCOPY WITH RETROGRADE PYELOGRAM, URETEROSCOPY AND STENT PLACEMENT Bilateral 06/18/2013   Procedure: CYSTOSCOPY WITH RETROGRADE PYELOGRAM, URETEROSCOPY AND STENT  PLACEMENT;  Surgeon: Alexis Frock, MD;  Location: [facility];  Service: Urology;  Laterality: Bilateral;  . EXTRACORPOREAL SHOCK WAVE LITHOTRIPSY Left JUNE 2009  . HOLMIUM LASER APPLICATION Bilateral [id number]   Procedure: HOLMIUM LASER APPLICATION;  Surgeon: Alexis Frock, MD;  Location: [facility];  Service: Urology;  Laterality: Bilateral;  . KNEE ARTHROSCOPY W/ MENISCAL REPAIR Right MAY 2014  . LEFT URETEROSCOPIC STONE EXTRACTION  04-04-2008  . RETINAL TEAR REPAIR CRYOTHERAPY Left 09/2014   Repaired by laser surgery  . SHOULDER SURGERY Right   . SQUAMOUS CELL CARCINOMA EXCISION Right 07/2014   Had skin graft from in front of ear  . TONSILLECTOMY AND ADENOIDECTOMY  AS CHILD  . UPPER GASTROINTESTINAL ENDOSCOPY  2011   GERD    reports that he has never smoked. He has never used smokeless tobacco. He reports current alcohol use of about 14.0 standard drinks of alcohol per week. He reports that he does not use drugs. family history includes Cancer in his mother; Crohn's disease in his cousin; Dementia in his mother; Diabetes in his father; Heart disease in his father, maternal grandfather, and maternal grandmother; Hypertension in his mother; Pancreatic cancer in his paternal aunt. Allergies  Allergen Reactions  . Codeine Nausea And Vomiting   Current Outpatient Medications on File Prior to Visit  Medication Sig Dispense Refill  . aspirin 81 MG tablet Take 81 mg by mouth daily.    Marland Kitchen buPROPion (WELLBUTRIN XL) 300 MG 24 hr tablet Take 300 mg by mouth  daily.    . indapamide (LOZOL) 2.5 MG tablet Take 2.5 mg by mouth daily.    Marland Kitchen LORazepam (ATIVAN) 1 MG tablet Take 1 mg by mouth 2 (two) times daily.     Marland Kitchen omeprazole (PRILOSEC) 20 MG capsule Take 1 capsule (20 mg total) by mouth daily. 90 capsule 3  . rosuvastatin (CRESTOR) 20 MG tablet TAKE 1 TABLET BY MOUTH EVERY DAY 90 tablet 3  . sildenafil (VIAGRA) 100 MG tablet Take 0.5-1 tablets (50-100 mg total) by mouth  daily as needed for erectile dysfunction. 5 tablet 11  . TURMERIC PO Take 2,000 mg by mouth daily.    Marland Kitchen zolpidem (AMBIEN) 10 MG tablet Take 10 mg by mouth at bedtime as needed for sleep.     No current facility-administered medications on file prior to visit.   Review of Systems All otherwise neg per pt     Objective:   Physical Exam BP 116/72 (BP Location: Left Arm, Patient Position: Sitting, Cuff Size: Normal)   Pulse 65   Temp 97.7 F (36.5 C) (Oral)   Ht 5\' 11"  (1.803 m)   Wt 174 lb 6.4 oz (79.1 kg)   SpO2 99%   BMI 24.32 kg/m  VS noted,  Constitutional: Pt appears in NAD HENT: Head: NCAT.  Right Ear: External ear normal.  Left Ear: External ear normal.  Eyes: . Pupils are equal, round, and reactive to light. Conjunctivae and EOM are normal Nose: without d/c or deformity Neck: Neck supple. Gross normal ROM Cardiovascular: Normal rate and regular rhythm.   Pulmonary/Chest: Effort normal and breath sounds without rales or wheezing.  Abd:  Soft, NT, ND, + BS, no organomegaly Neurological: Pt is alert. At baseline orientation, motor grossly intact Skin: Skin is warm. No rashes, other new lesions, no LE edema Psychiatric: Pt behavior is normal without agitation  All otherwise neg per pt Lab Results  Component Value Date   WBC 7.5 08/01/2019   HGB 15.1 08/01/2019   HCT 45.4 08/01/2019   PLT 229.0 08/01/2019   GLUCOSE 80 08/01/2019   CHOL 175 08/01/2019   TRIG 85.0 08/01/2019   HDL 61.50 08/01/2019   LDLDIRECT 130.7 10/16/2011   LDLCALC 96 08/01/2019   ALT 16 08/01/2019   AST 17 08/01/2019   NA 137 08/01/2019   K 3.8 08/01/2019   CL 97 08/01/2019   CREATININE 1.07 08/01/2019   BUN 25 (H) 08/01/2019   CO2 30 08/01/2019   TSH 1.80 08/01/2019   PSA 1.04 08/01/2019   HGBA1C 5.4 09/04/2017      Assessment & Plan:

## 2019-08-07 ENCOUNTER — Telehealth: Payer: Self-pay | Admitting: Internal Medicine

## 2019-08-07 NOTE — Telephone Encounter (Signed)
Patient has dropped off [facility] [employer] form.  He is requesting call once form has been completed and faxed in.

## 2019-08-09 DIAGNOSIS — Z20828 Contact with and (suspected) exposure to other viral communicable diseases: Secondary | ICD-10-CM | POA: Diagnosis not present

## 2019-08-11 ENCOUNTER — Encounter: Payer: Self-pay | Admitting: Internal Medicine

## 2019-08-11 DIAGNOSIS — U071 COVID-19: Secondary | ICD-10-CM | POA: Diagnosis not present

## 2019-08-11 DIAGNOSIS — Z20828 Contact with and (suspected) exposure to other viral communicable diseases: Secondary | ICD-10-CM | POA: Diagnosis not present

## 2019-08-11 DIAGNOSIS — Z03818 Encounter for observation for suspected exposure to other biological agents ruled out: Secondary | ICD-10-CM | POA: Diagnosis not present

## 2019-08-12 ENCOUNTER — Encounter: Payer: Self-pay | Admitting: Internal Medicine

## 2019-08-12 ENCOUNTER — Ambulatory Visit (INDEPENDENT_AMBULATORY_CARE_PROVIDER_SITE_OTHER): Payer: BC Managed Care – PPO | Admitting: Internal Medicine

## 2019-08-12 DIAGNOSIS — R739 Hyperglycemia, unspecified: Secondary | ICD-10-CM | POA: Diagnosis not present

## 2019-08-12 DIAGNOSIS — U071 COVID-19: Secondary | ICD-10-CM | POA: Diagnosis not present

## 2019-08-12 MED ORDER — ONDANSETRON HCL 4 MG PO TABS: 4.0000 mg | ORAL_TABLET | Freq: Three times a day (TID) | ORAL | 0 refills | Status: DC | PRN

## 2019-08-12 MED ORDER — HYDROCODONE-HOMATROPINE 5-1.5 MG/5ML PO SYRP: 5.0000 mL | ORAL_SOLUTION | Freq: Four times a day (QID) | ORAL | 0 refills | Status: AC | PRN

## 2019-08-12 MED ORDER — HYDROCODONE-HOMATROPINE 5-1.5 MG/5ML PO SYRP: 5.0000 mL | ORAL_SOLUTION | Freq: Four times a day (QID) | ORAL | 0 refills | Status: DC | PRN

## 2019-08-12 NOTE — Progress Notes (Signed)
Patient ID: [name] [name], male   DOB: Dec 06, 1955, 64 y.o.   MRN: [id number]  Virtual Visit via Video Note  I connected with Serafina Mitchell on 08/12/19 at  9:20 AM EST by a video enabled telemedicine application and verified that I am speaking with the correct person using two identifiers.  Location: Patient: at home Provider: at office   I discussed the limitations of evaluation and management by telemedicine and the availability of in person appointments. The patient expressed understanding and agreed to proceed.  History of Present Illness: Here after acute onset covid symptoms jan 28 with dry cough, mild transient ST 1 day, low grade fever, transient nause 1 day but no wheezing sob or CP. Denies worsening reflux, abd pain, dysphagia, n/v, bowel change or blood.  Pt denies new neurological symptoms such as new headache, or facial or extremity weakness or numbness   Pt denies polydipsia, polyuria Past Medical History:  Diagnosis Date  . Anxiety   . GERD (gastroesophageal reflux disease)   . History of colon polyps   . History of kidney stones   . History of skin cancer    squamous  . Hyperlipidemia   . Kidney stones   . RBBB    NOTED IN 2006   Past Surgical History:  Procedure Laterality Date  . CARDIOVASCULAR STRESS TEST  09-06-2007  DR Johnsie Cancel   NORMAL NUCLEAR STUDY/  NO ISCHEMIA/  EF 53%  . COLONOSCOPY  2007  POLYPECTOMY/   2012  NEGATIVE   neg 07/06/2099  . CYSTOSCOPY W/ URETERAL STENT REMOVAL Bilateral 07/07/2013   Procedure: CYSTOSCOPY WITH STENT REMOVAL;  Surgeon: Alexis Frock, MD;  Location: [facility];  Service: Urology;  Laterality: Bilateral;  . CYSTOSCOPY WITH RETROGRADE PYELOGRAM, URETEROSCOPY AND STENT PLACEMENT Bilateral 06/18/2013   Procedure: CYSTOSCOPY WITH RETROGRADE PYELOGRAM, URETEROSCOPY AND STENT PLACEMENT;  Surgeon: Alexis Frock, MD;  Location: [facility];  Service: Urology;  Laterality: Bilateral;  . EXTRACORPOREAL  SHOCK WAVE LITHOTRIPSY Left JUNE 2009  . HOLMIUM LASER APPLICATION Bilateral [id number]   Procedure: HOLMIUM LASER APPLICATION;  Surgeon: Alexis Frock, MD;  Location: [facility];  Service: Urology;  Laterality: Bilateral;  . KNEE ARTHROSCOPY W/ MENISCAL REPAIR Right MAY 2014  . LEFT URETEROSCOPIC STONE EXTRACTION  04-04-2008  . RETINAL TEAR REPAIR CRYOTHERAPY Left 09/2014   Repaired by laser surgery  . SHOULDER SURGERY Right   . SQUAMOUS CELL CARCINOMA EXCISION Right 07/2014   Had skin graft from in front of ear  . TONSILLECTOMY AND ADENOIDECTOMY  AS CHILD  . UPPER GASTROINTESTINAL ENDOSCOPY  2011   GERD    reports that he has never smoked. He has never used smokeless tobacco. He reports current alcohol use of about 14.0 standard drinks of alcohol per week. He reports that he does not use drugs. family history includes Cancer in his mother; Crohn's disease in his cousin; Dementia in his mother; Diabetes in his father; Heart disease in his father, maternal grandfather, and maternal grandmother; Hypertension in his mother; Pancreatic cancer in his paternal aunt. Allergies  Allergen Reactions  . Codeine Nausea And Vomiting   Current Outpatient Medications on File Prior to Visit  Medication Sig Dispense Refill  . aspirin 81 MG tablet Take 81 mg by mouth daily.    Marland Kitchen buPROPion (WELLBUTRIN XL) 300 MG 24 hr tablet Take 300 mg by mouth daily.    . indapamide (LOZOL) 2.5 MG tablet Take 2.5 mg by mouth daily.    Marland Kitchen  LORazepam (ATIVAN) 1 MG tablet Take 1 mg by mouth 2 (two) times daily.     Marland Kitchen omeprazole (PRILOSEC) 20 MG capsule Take 1 capsule (20 mg total) by mouth daily. 90 capsule 3  . rosuvastatin (CRESTOR) 20 MG tablet TAKE 1 TABLET BY MOUTH EVERY DAY 90 tablet 3  . sildenafil (VIAGRA) 100 MG tablet Take 0.5-1 tablets (50-100 mg total) by mouth daily as needed for erectile dysfunction. 5 tablet 11  . TURMERIC PO Take 2,000 mg by mouth daily.    Marland Kitchen zolpidem (AMBIEN) 10 MG tablet Take  10 mg by mouth at bedtime as needed for sleep.     No current facility-administered medications on file prior to visit.    Observations/Objective: Alert, NAD, appropriate mood and affect, resps normal, cn 2-12 intact, moves all 4s, no visible rash or swelling Lab Results  Component Value Date   WBC 7.5 08/01/2019   HGB 15.1 08/01/2019   HCT 45.4 08/01/2019   PLT 229.0 08/01/2019   GLUCOSE 80 08/01/2019   CHOL 175 08/01/2019   TRIG 85.0 08/01/2019   HDL 61.50 08/01/2019   LDLDIRECT 130.7 10/16/2011   LDLCALC 96 08/01/2019   ALT 16 08/01/2019   AST 17 08/01/2019   NA 137 08/01/2019   K 3.8 08/01/2019   CL 97 08/01/2019   CREATININE 1.07 08/01/2019   BUN 25 (H) 08/01/2019   CO2 30 08/01/2019   TSH 1.80 08/01/2019   PSA 1.04 08/01/2019   HGBA1C 5.4 09/04/2017   Assessment and Plan: See notes  Follow Up Instructions: See notes   I discussed the assessment and treatment plan with the patient. The patient was provided an opportunity to ask questions and all were answered. The patient agreed with the plan and demonstrated an understanding of the instructions.   The patient was advised to call back or seek an in-person evaluation if the symptoms worsen or if the condition fails to improve as anticipated.  Cathlean Cower, MD

## 2019-08-12 NOTE — Assessment & Plan Note (Signed)
stable overall by history and exam, recent data reviewed with pt, and pt to continue medical treatment as before,  to f/u any worsening symptoms or concerns  

## 2019-08-12 NOTE — Patient Instructions (Signed)
See notes

## 2019-08-12 NOTE — Progress Notes (Signed)
   Subjective:    Patient ID: [name] [name], male    DOB: [birth year], 64 y.o.   MRN: [id number]  HPI    Review of Systems     Objective:   Physical Exam        Assessment & Plan:

## 2019-08-12 NOTE — Assessment & Plan Note (Addendum)
Overall stable, for cough med, zofran prn, continue to monitor for sob  I spent 30 minutes preparing to see the patient by review of recent labs, imaging and procedures, obtaining and reviewing separately obtained history, communicating with the patient and family or caregiver, ordering medications, tests or procedures, and documenting clinical information in the EHR including the differential Dx, treatment, and any further evaluation and other management of covid virus infection, hyperglycemia

## 2019-08-15 ENCOUNTER — Encounter: Payer: Self-pay | Admitting: Internal Medicine

## 2019-08-18 ENCOUNTER — Encounter: Payer: Self-pay | Admitting: *Deleted

## 2019-09-10 ENCOUNTER — Other Ambulatory Visit: Payer: Self-pay | Admitting: Internal Medicine

## 2019-09-29 ENCOUNTER — Inpatient Hospital Stay (HOSPITAL_COMMUNITY)
Admission: RE | Admit: 2019-09-29 | Discharge: 2019-09-29 | Disposition: A | Discharge status: Home / Self Care | Payer: BC Managed Care – PPO | Source: Ambulatory Visit | Attending: Internal Medicine | Admitting: Internal Medicine

## 2019-09-29 NOTE — Progress Notes (Signed)
Patient was positive for COVID on 08/09/19 results are in [place]. Patient does not need to be retested before he procedure on 10/02/19. His positive result is within the 90 day protocol of no retest needed before procedure on 3/25

## 2019-10-02 ENCOUNTER — Ambulatory Visit (INDEPENDENT_AMBULATORY_CARE_PROVIDER_SITE_OTHER): Payer: BC Managed Care – PPO | Admitting: Internal Medicine

## 2019-10-02 ENCOUNTER — Other Ambulatory Visit: Payer: Self-pay

## 2019-10-02 DIAGNOSIS — R0600 Dyspnea, unspecified: Secondary | ICD-10-CM | POA: Diagnosis not present

## 2019-10-02 LAB — PULMONARY FUNCTION TEST
DL/VA % pred: 101 %
DL/VA: 4.22 ml/min/mmHg/L
DLCO cor % pred: 105 %
DLCO cor: 29.26 ml/min/mmHg
DLCO unc % pred: 105 %
DLCO unc: 29.26 ml/min/mmHg
FEF 25-75 Post: 5.63 L/sec
FEF 25-75 Pre: 5.17 L/sec
FEF2575-%Change-Post: 8 %
FEF2575-%Pred-Post: 196 %
FEF2575-%Pred-Pre: 180 %
FEV1-%Change-Post: 1 %
FEV1-%Pred-Post: 116 %
FEV1-%Pred-Pre: 115 %
FEV1-Post: 4.19 L
FEV1-Pre: 4.14 L
FEV1FVC-%Change-Post: 1 %
FEV1FVC-%Pred-Pre: 112 %
FEV6-%Change-Post: 0 %
FEV6-%Pred-Post: 106 %
FEV6-%Pred-Pre: 106 %
FEV6-Post: 4.89 L
FEV6-Pre: 4.88 L
FEV6FVC-%Change-Post: 0 %
FEV6FVC-%Pred-Post: 104 %
FEV6FVC-%Pred-Pre: 104 %
FVC-%Change-Post: 0 %
FVC-%Pred-Post: 101 %
FVC-%Pred-Pre: 101 %
FVC-Post: 4.9 L
FVC-Pre: 4.9 L
Post FEV1/FVC ratio: 85 %
Post FEV6/FVC ratio: 100 %
Pre FEV1/FVC ratio: 84 %
Pre FEV6/FVC Ratio: 100 %
RV % pred: 96 %
RV: 2.29 L
TLC % pred: 100 %
TLC: 7.23 L

## 2019-10-02 NOTE — Progress Notes (Signed)
Referred by Dr. Cathlean Cower Full PFT performed today.

## 2019-10-07 ENCOUNTER — Encounter: Payer: Self-pay | Admitting: Internal Medicine

## 2019-10-08 DIAGNOSIS — N200 Calculus of kidney: Secondary | ICD-10-CM | POA: Diagnosis not present

## 2019-10-08 DIAGNOSIS — Z125 Encounter for screening for malignant neoplasm of prostate: Secondary | ICD-10-CM | POA: Diagnosis not present

## 2019-10-13 DIAGNOSIS — N200 Calculus of kidney: Secondary | ICD-10-CM | POA: Diagnosis not present

## 2019-10-13 DIAGNOSIS — N5201 Erectile dysfunction due to arterial insufficiency: Secondary | ICD-10-CM | POA: Diagnosis not present

## 2019-10-16 ENCOUNTER — Other Ambulatory Visit: Payer: Self-pay | Admitting: Internal Medicine

## 2019-10-16 NOTE — Telephone Encounter (Signed)
Please refill as per office routine med refill policy (all routine meds refilled for 3 mo or monthly per pt preference up to one year from last visit, then month to month grace period for 3 mo, then further med refills will have to be denied)  

## 2019-11-19 ENCOUNTER — Encounter: Payer: Self-pay | Admitting: Internal Medicine

## 2019-11-20 DIAGNOSIS — F411 Generalized anxiety disorder: Secondary | ICD-10-CM | POA: Diagnosis not present

## 2019-11-20 DIAGNOSIS — F330 Major depressive disorder, recurrent, mild: Secondary | ICD-10-CM | POA: Diagnosis not present

## 2019-11-22 ENCOUNTER — Ambulatory Visit: Payer: BC Managed Care – PPO | Attending: Internal Medicine

## 2019-11-22 DIAGNOSIS — Z23 Encounter for immunization: Secondary | ICD-10-CM

## 2019-11-22 NOTE — Progress Notes (Signed)
   Covid-19 Vaccination Clinic  Name:  [name] [name]    MRN: [id number] DOB: [birth year]  11/22/2019  [name] was observed post Covid-19 immunization for 15 minutes without incident. He was provided with Vaccine Information Sheet and instruction to access the V-Safe system.   [name] was instructed to call 911 with any severe reactions post vaccine: Marland Kitchen Difficulty breathing  . Swelling of face and throat  . A fast heartbeat  . A bad rash all over body  . Dizziness and weakness   Immunizations Administered    Name Date Dose VIS Date Route   Pfizer COVID-19 Vaccine 11/22/2019 11:50 AM 0.3 mL 09/03/2018 Intramuscular   Manufacturer: [employer], [employer]   Lot: [id number]   [place]: [id number]

## 2020-03-12 ENCOUNTER — Telehealth: Payer: Self-pay | Admitting: Internal Medicine

## 2020-03-12 NOTE — Telephone Encounter (Signed)
    Patient request order for COVID antibody testing

## 2020-03-12 NOTE — Telephone Encounter (Signed)
There would be no reason if he has had the vaccine, so does he really want this?   The only reason is to see if he has evidence of covid infection if he has been symptomatic and not had the vaccine

## 2020-03-18 NOTE — Telephone Encounter (Signed)
Patient's wife is calling to follow up, Can you please call patient and go over this with him.

## 2020-03-22 NOTE — Telephone Encounter (Signed)
Again this is not needed as it will not change any treatment, since we are not testing for the actual antibody level.   A positive or negative test would not change the fact he still needs the covid vaccines done as best he can.

## 2020-03-25 NOTE — Telephone Encounter (Signed)
Pt has been informed and expressed understanding.  

## 2020-06-18 HISTORY — PX: OTHER SURGICAL HISTORY: SHX169

## 2020-08-02 ENCOUNTER — Other Ambulatory Visit: Payer: Self-pay

## 2020-08-02 ENCOUNTER — Other Ambulatory Visit (INDEPENDENT_AMBULATORY_CARE_PROVIDER_SITE_OTHER): Payer: BC Managed Care – PPO

## 2020-08-02 ENCOUNTER — Other Ambulatory Visit: Payer: BC Managed Care – PPO

## 2020-08-02 DIAGNOSIS — R739 Hyperglycemia, unspecified: Secondary | ICD-10-CM

## 2020-08-02 DIAGNOSIS — Z0000 Encounter for general adult medical examination without abnormal findings: Secondary | ICD-10-CM

## 2020-08-02 DIAGNOSIS — Z125 Encounter for screening for malignant neoplasm of prostate: Secondary | ICD-10-CM

## 2020-08-02 LAB — URINALYSIS, ROUTINE W REFLEX MICROSCOPIC
Bilirubin Urine: NEGATIVE
Hgb urine dipstick: NEGATIVE
Ketones, ur: NEGATIVE
Nitrite: NEGATIVE
RBC / HPF: NONE SEEN (ref 0–?)
Specific Gravity, Urine: >=1.03 — AB (ref 1.000–1.030)
Total Protein, Urine: NEGATIVE
Urine Glucose: NEGATIVE
Urobilinogen, UA: 0.2 (ref 0.0–1.0)
pH: 6 (ref 5.0–8.0)

## 2020-08-02 LAB — CBC WITH DIFFERENTIAL/PLATELET
Basophils Absolute: 0 10*3/uL (ref 0.0–0.1)
Basophils Relative: 0.7 % (ref 0.0–3.0)
Eosinophils Absolute: 0.2 10*3/uL (ref 0.0–0.7)
Eosinophils Relative: 2.7 % (ref 0.0–5.0)
HCT: 44.5 % (ref 39.0–52.0)
Hemoglobin: 15 g/dL (ref 13.0–17.0)
Lymphocytes Relative: 29.3 % (ref 12.0–46.0)
Lymphs Abs: 2 10*3/uL (ref 0.7–4.0)
MCHC: 33.6 g/dL (ref 30.0–36.0)
MCV: 88.9 fl (ref 78.0–100.0)
Monocytes Absolute: 0.7 10*3/uL (ref 0.1–1.0)
Monocytes Relative: 9.5 % (ref 3.0–12.0)
Neutro Abs: 4 10*3/uL (ref 1.4–7.7)
Neutrophils Relative %: 57.8 % (ref 43.0–77.0)
Platelets: 245 10*3/uL (ref 150.0–400.0)
RBC: 5.01 Mil/uL (ref 4.22–5.81)
RDW: 13.2 % (ref 11.5–15.5)
WBC: 6.9 10*3/uL (ref 4.0–10.5)

## 2020-08-02 LAB — BASIC METABOLIC PANEL
BUN: 25 mg/dL — ABNORMAL HIGH (ref 6–23)
CO2: 30 mEq/L (ref 19–32)
Calcium: 9.6 mg/dL (ref 8.4–10.5)
Chloride: 102 mEq/L (ref 96–112)
Creatinine, Ser: 1.14 mg/dL (ref 0.40–1.50)
GFR: 67.64 mL/min (ref >60.00)
Glucose, Bld: 92 mg/dL (ref 70–99)
Potassium: 4.1 mEq/L (ref 3.5–5.1)
Sodium: 140 mEq/L (ref 135–145)

## 2020-08-02 LAB — LIPID PANEL
Cholesterol: 173 mg/dL (ref 0–200)
HDL: 61.8 mg/dL (ref >39.00)
LDL Cholesterol: 95 mg/dL (ref 0–99)
NonHDL: 111.51
Total CHOL/HDL Ratio: 3
Triglycerides: 83 mg/dL (ref 0.0–149.0)
VLDL: 16.6 mg/dL (ref 0.0–40.0)

## 2020-08-02 LAB — TSH: TSH: 2.27 u[IU]/mL (ref 0.35–4.50)

## 2020-08-02 LAB — PSA: PSA: 1.24 ng/mL (ref 0.10–4.00)

## 2020-08-02 LAB — HEPATIC FUNCTION PANEL
ALT: 34 U/L (ref 0–53)
AST: 20 U/L (ref 0–37)
Albumin: 4.4 g/dL (ref 3.5–5.2)
Alkaline Phosphatase: 63 U/L (ref 39–117)
Bilirubin, Direct: 0.1 mg/dL (ref 0.0–0.3)
Total Bilirubin: 0.5 mg/dL (ref 0.2–1.2)
Total Protein: 6.3 g/dL (ref 6.0–8.3)

## 2020-08-03 LAB — HEMOGLOBIN A1C
Hgb A1c MFr Bld: 5.5 % of total Hgb (ref <5.7)
Mean Plasma Glucose: 111 mg/dL
eAG (mmol/L): 6.2 mmol/L

## 2020-08-05 ENCOUNTER — Other Ambulatory Visit: Payer: Self-pay

## 2020-08-06 ENCOUNTER — Encounter: Payer: Self-pay | Admitting: Internal Medicine

## 2020-08-06 ENCOUNTER — Ambulatory Visit (INDEPENDENT_AMBULATORY_CARE_PROVIDER_SITE_OTHER): Payer: BC Managed Care – PPO | Admitting: Internal Medicine

## 2020-08-06 VITALS — BP 124/80 | HR 80 | Temp 98.4°F | Ht 71.0 in | Wt 190.1 lb

## 2020-08-06 DIAGNOSIS — Z0000 Encounter for general adult medical examination without abnormal findings: Secondary | ICD-10-CM | POA: Diagnosis not present

## 2020-08-06 DIAGNOSIS — R739 Hyperglycemia, unspecified: Secondary | ICD-10-CM

## 2020-08-06 DIAGNOSIS — E7849 Other hyperlipidemia: Secondary | ICD-10-CM | POA: Diagnosis not present

## 2020-08-06 DIAGNOSIS — E559 Vitamin D deficiency, unspecified: Secondary | ICD-10-CM | POA: Diagnosis not present

## 2020-08-06 NOTE — Progress Notes (Signed)
Established Patient Office Visit  Subjective:  Patient ID: [name] [name], male    DOB: 1956-03-17  Age: 65 y.o. MRN: [id number]       Chief Complaint:: wellness exam        HPI:  [name] [name] is a 65 y.o. male here for wellness exam.  S/p covid infection jan 2021, and /p vax x 1 only may 2021.  Declined so far further vaccination.  Regained some wt with excess calories over holidays.   Wt Readings from Last 3 Encounters:  08/06/20 190 lb 2 oz (86.2 kg)  08/06/19 174 lb 6.4 oz (79.1 kg)  09/11/18 187 lb (84.8 kg)   BP Readings from Last 3 Encounters:  08/06/20 124/80  08/12/19 110/80  08/06/19 116/72   Immunization History  Administered Date(s) Administered  . Influenza Split 05/07/2012  . Influenza Whole 03/27/2008, 06/23/2010  . Influenza, High Dose Seasonal PF 07/06/2020  . Influenza,inj,Quad PF,6+ Mos 06/19/2014, 04/29/2015, 05/25/2016, 04/13/2019  . Influenza-Unspecified 04/09/2017, 04/09/2018  . PFIZER(Purple Top)SARS-COV-2 Vaccination 11/22/2019  . Tdap 10/16/2011  . Zoster Recombinat (Shingrix) 02/07/2017, 06/14/2017   Health Maintenance Due  Topic Date Due  . PNA vac Low Risk Adult (1 of 2 - PCV13) Never done     Past Medical History:  Diagnosis Date  . Anxiety   . GERD (gastroesophageal reflux disease)   . History of colon polyps   . History of kidney stones   . History of skin cancer    squamous  . Hyperlipidemia   . Kidney stones   . RBBB    NOTED IN 2006   Past Surgical History:  Procedure Laterality Date  . CARDIOVASCULAR STRESS TEST  09-06-2007  DR Johnsie Cancel   NORMAL NUCLEAR STUDY/  NO ISCHEMIA/  EF 53%  . COLONOSCOPY  2007  POLYPECTOMY/   2012  NEGATIVE   neg 07/06/2099  . CYSTOSCOPY W/ URETERAL STENT REMOVAL Bilateral 07/07/2013   Procedure: CYSTOSCOPY WITH STENT REMOVAL;  Surgeon: Alexis Frock, MD;  Location: [facility];  Service: Urology;  Laterality: Bilateral;  . CYSTOSCOPY WITH RETROGRADE PYELOGRAM, URETEROSCOPY  AND STENT PLACEMENT Bilateral 06/18/2013   Procedure: CYSTOSCOPY WITH RETROGRADE PYELOGRAM, URETEROSCOPY AND STENT PLACEMENT;  Surgeon: Alexis Frock, MD;  Location: [facility];  Service: Urology;  Laterality: Bilateral;  . EXTRACORPOREAL SHOCK WAVE LITHOTRIPSY Left JUNE 2009  . HOLMIUM LASER APPLICATION Bilateral 28/31/5176   Procedure: HOLMIUM LASER APPLICATION;  Surgeon: Alexis Frock, MD;  Location: [facility];  Service: Urology;  Laterality: Bilateral;  . KNEE ARTHROSCOPY W/ MENISCAL REPAIR Right MAY 2014  . LEFT URETEROSCOPIC STONE EXTRACTION  04-04-2008  . RETINAL TEAR REPAIR CRYOTHERAPY Left 09/2014   Repaired by laser surgery  . SHOULDER SURGERY Right   . SQUAMOUS CELL CARCINOMA EXCISION Right 07/2014   Had skin graft from in front of ear  . thumb surgery Right 06/18/2020   Dr. Laurelyn Sickle  . TONSILLECTOMY AND ADENOIDECTOMY  AS CHILD  . UPPER GASTROINTESTINAL ENDOSCOPY  2011   GERD    reports that he has never smoked. He has never used smokeless tobacco. He reports current alcohol use of about 14.0 standard drinks of alcohol per week. He reports that he does not use drugs. family history includes Cancer in his mother; Crohn's disease in his cousin; Dementia in his mother; Diabetes in his father; Heart disease in his father, maternal grandfather, and maternal grandmother; Hypertension in his mother; Pancreatic cancer in his paternal aunt. Allergies  Allergen  Reactions  . Codeine Nausea And Vomiting   Current Outpatient Medications on File Prior to Visit  Medication Sig Dispense Refill  . aspirin 81 MG tablet Take 81 mg by mouth daily.    Marland Kitchen buPROPion (WELLBUTRIN XL) 300 MG 24 hr tablet Take 300 mg by mouth daily.    . indapamide (LOZOL) 2.5 MG tablet Take 2.5 mg by mouth daily.    Marland Kitchen LORazepam (ATIVAN) 1 MG tablet Take 1 mg by mouth 2 (two) times daily.     Marland Kitchen omeprazole (PRILOSEC) 20 MG capsule TAKE 1 CAPSULE BY MOUTH EVERY DAY 90 capsule 3  .  ondansetron (ZOFRAN) 4 MG tablet Take 1 tablet (4 mg total) by mouth every 8 (eight) hours as needed for nausea or vomiting. 20 tablet 0  . rosuvastatin (CRESTOR) 20 MG tablet TAKE 1 TABLET BY MOUTH EVERY DAY 90 tablet 3  . sildenafil (VIAGRA) 100 MG tablet Take 0.5-1 tablets (50-100 mg total) by mouth daily as needed for erectile dysfunction. 5 tablet 11  . TURMERIC PO Take 2,000 mg by mouth daily.     No current facility-administered medications on file prior to visit.        ROS:  All others reviewed and negative.  Objective        PE:  BP 124/80 (BP Location: Left Arm, Patient Position: Sitting, Cuff Size: Normal)   Pulse 80   Temp 98.4 F (36.9 C) (Oral)   Ht 5\' 11"  (1.803 m)   Wt 190 lb 2 oz (86.2 kg)   SpO2 96%   BMI 26.52 kg/m                 Constitutional: Pt appears in NAD               HENT: Head: NCAT.                Right Ear: External ear normal.                 Left Ear: External ear normal.                Eyes: . Pupils are equal, round, and reactive to light. Conjunctivae and EOM are normal               Nose: without d/c or deformity               Neck: Neck supple. Gross normal ROM               Cardiovascular: Normal rate and regular rhythm.                 Pulmonary/Chest: Effort normal and breath sounds without rales or wheezing.                Abd:  Soft, NT, ND, + BS, no organomegaly               Neurological: Pt is alert. At baseline orientation, motor grossly intact               Skin: Skin is warm. No rashes, no other new lesions, LE edema - none               Psychiatric: Pt behavior is normal without agitation   Assessment/Plan:  [name] [name] is a 65 y.o. White or Caucasian [1] male with  has a past medical history of Anxiety, GERD (gastroesophageal reflux disease), History of colon polyps, History of kidney stones,  History of skin cancer, Hyperlipidemia, Kidney stones, and RBBB.  Micro: none  Cardiac tracings I have personally interpreted  today:  none  Pertinent Radiological findings (summarize): none   Lab Results  Component Value Date   WBC 6.9 08/02/2020   HGB 15.0 08/02/2020   HCT 44.5 08/02/2020   PLT 245.0 08/02/2020   GLUCOSE 92 08/02/2020   CHOL 173 08/02/2020   TRIG 83.0 08/02/2020   HDL 61.80 08/02/2020   LDLDIRECT 130.7 10/16/2011   LDLCALC 95 08/02/2020   ALT 34 08/02/2020   AST 20 08/02/2020   NA 140 08/02/2020   K 4.1 08/02/2020   CL 102 08/02/2020   CREATININE 1.14 08/02/2020   BUN 25 (H) 08/02/2020   CO2 30 08/02/2020   TSH 2.27 08/02/2020   PSA 1.24 08/02/2020   HGBA1C 5.5 08/02/2020       Assessment & Plan:   Problem List Items Addressed This Visit      High   Preventative health care - Primary    Age and sex appropriate education and counseling updated with regular exercise and diet Referrals for preventative services - none needed  Immunizations addressed - declines prevnar 13 as the new prevnar 20 has just come out but not yet available here Smoking counseling  - none needed Evidence for depression or other mood disorder - none significant Most recent labs reviewed. I have personally reviewed and have noted: 1) the patient's medical and social history 2) The patient's current medications and supplements 3) The patient's height, weight, and BMI have been recorded in the chart         Medium   Hyperglycemia    Lab Results  Component Value Date   HGBA1C 5.5 08/02/2020   Stable, pt to continue current medical treatment   Diet and wt loss       HLD (hyperlipidemia)    Lab Results  Component Value Date   [place] 95 08/02/2020   Stable, pt to continue current statin crestor 20   Current Outpatient Medications (Cardiovascular):  .  indapamide (LOZOL) 2.5 MG tablet, Take 2.5 mg by mouth daily. .  rosuvastatin (CRESTOR) 20 MG tablet, TAKE 1 TABLET BY MOUTH EVERY DAY .  sildenafil (VIAGRA) 100 MG tablet, Take 0.5-1 tablets (50-100 mg total) by mouth daily as needed for  erectile dysfunction.   Current Outpatient Medications (Analgesics):  .  aspirin 81 MG tablet, Take 81 mg by mouth daily.   Current Outpatient Medications (Other):  Marland Kitchen  buPROPion (WELLBUTRIN XL) 300 MG 24 hr tablet, Take 300 mg by mouth daily. Marland Kitchen  LORazepam (ATIVAN) 1 MG tablet, Take 1 mg by mouth 2 (two) times daily.  Marland Kitchen  omeprazole (PRILOSEC) 20 MG capsule, TAKE 1 CAPSULE BY MOUTH EVERY DAY .  ondansetron (ZOFRAN) 4 MG tablet, Take 1 tablet (4 mg total) by mouth every 8 (eight) hours as needed for nausea or vomiting. .  TURMERIC PO, Take 2,000 mg by mouth daily.         No orders of the defined types were placed in this encounter.   Follow-up: Return in about 1 year (around 08/06/2021).   Cathlean Cower, MD 08/07/2020 2:38 PM [facility]

## 2020-08-06 NOTE — Patient Instructions (Signed)
We will try to look in to the Prevnar 20 new shot  Please continue all other medications as before, and refills have been done if requested.  Please have the pharmacy call with any other refills you may need.  Please continue your efforts at being more active, low cholesterol diet, and weight control.  You are otherwise up to date with prevention measures today.  Please keep your appointments with your specialists as you may have planned  Please make an Appointment to return for your 1 year visit, or sooner if needed, with Lab testing by Appointment as well, to be done about 3-5 days before at the [facility] (so this is for TWO appointments - please see the scheduling desk as you leave)  Due to the ongoing Covid 19 pandemic, our lab now requires an appointment for any labs done at our office.  If you need labs done and do not have an appointment, please call our office ahead of time to schedule before presenting to the lab for your testing.

## 2020-08-07 ENCOUNTER — Encounter: Payer: Self-pay | Admitting: Internal Medicine

## 2020-08-07 NOTE — Assessment & Plan Note (Signed)
Lab Results  Component Value Date   HGBA1C 5.5 08/02/2020   Stable, pt to continue current medical treatment   Diet and wt loss

## 2020-08-07 NOTE — Assessment & Plan Note (Signed)
Lab Results  Component Value Date   [place] 08/02/2020   Stable, pt to continue current statin crestor 20   Current Outpatient Medications (Cardiovascular):  .  indapamide (LOZOL) 2.5 MG tablet, Take 2.5 mg by mouth daily. .  rosuvastatin (CRESTOR) 20 MG tablet, TAKE 1 TABLET BY MOUTH EVERY DAY .  sildenafil (VIAGRA) 100 MG tablet, Take 0.5-1 tablets (50-100 mg total) by mouth daily as needed for erectile dysfunction.   Current Outpatient Medications (Analgesics):  .  aspirin 81 MG tablet, Take 81 mg by mouth daily.   Current Outpatient Medications (Other):  Marland Kitchen  buPROPion (WELLBUTRIN XL) 300 MG 24 hr tablet, Take 300 mg by mouth daily. Marland Kitchen  LORazepam (ATIVAN) 1 MG tablet, Take 1 mg by mouth 2 (two) times daily.  Marland Kitchen  omeprazole (PRILOSEC) 20 MG capsule, TAKE 1 CAPSULE BY MOUTH EVERY DAY .  ondansetron (ZOFRAN) 4 MG tablet, Take 1 tablet (4 mg total) by mouth every 8 (eight) hours as needed for nausea or vomiting. .  TURMERIC PO, Take 2,000 mg by mouth daily.

## 2020-08-07 NOTE — Assessment & Plan Note (Signed)
Age and sex appropriate education and counseling updated with regular exercise and diet Referrals for preventative services - none needed  Immunizations addressed - declines prevnar 13 as the new prevnar 20 has just come out but not yet available here Smoking counseling  - none needed Evidence for depression or other mood disorder - none significant Most recent labs reviewed. I have personally reviewed and have noted: 1) the patient's medical and social history 2) The patient's current medications and supplements 3) The patient's height, weight, and BMI have been recorded in the chart

## 2020-08-29 ENCOUNTER — Other Ambulatory Visit: Payer: Self-pay | Admitting: Internal Medicine

## 2020-08-29 NOTE — Telephone Encounter (Signed)
Please refill as per office routine med refill policy (all routine meds refilled for 3 mo or monthly per pt preference up to one year from last visit, then month to month grace period for 3 mo, then further med refills will have to be denied)  

## 2020-09-27 DIAGNOSIS — M189 Osteoarthritis of first carpometacarpal joint, unspecified: Secondary | ICD-10-CM | POA: Insufficient documentation

## 2020-10-12 DIAGNOSIS — Z125 Encounter for screening for malignant neoplasm of prostate: Secondary | ICD-10-CM | POA: Diagnosis not present

## 2020-10-12 DIAGNOSIS — N200 Calculus of kidney: Secondary | ICD-10-CM | POA: Diagnosis not present

## 2020-10-13 ENCOUNTER — Other Ambulatory Visit: Payer: Self-pay | Admitting: Internal Medicine

## 2020-10-13 NOTE — Telephone Encounter (Signed)
Please refill as per office routine med refill policy (all routine meds refilled for 3 mo or monthly per pt preference up to one year from last visit, then month to month grace period for 3 mo, then further med refills will have to be denied)  

## 2020-10-19 DIAGNOSIS — N200 Calculus of kidney: Secondary | ICD-10-CM | POA: Diagnosis not present

## 2020-10-19 DIAGNOSIS — Z125 Encounter for screening for malignant neoplasm of prostate: Secondary | ICD-10-CM | POA: Diagnosis not present

## 2020-10-19 DIAGNOSIS — N5201 Erectile dysfunction due to arterial insufficiency: Secondary | ICD-10-CM | POA: Diagnosis not present

## 2020-10-28 DIAGNOSIS — F411 Generalized anxiety disorder: Secondary | ICD-10-CM | POA: Diagnosis not present

## 2020-10-28 DIAGNOSIS — F330 Major depressive disorder, recurrent, mild: Secondary | ICD-10-CM | POA: Diagnosis not present

## 2021-03-21 DIAGNOSIS — M180 Bilateral primary osteoarthritis of first carpometacarpal joints: Secondary | ICD-10-CM | POA: Diagnosis not present

## 2021-04-15 DIAGNOSIS — F330 Major depressive disorder, recurrent, mild: Secondary | ICD-10-CM | POA: Diagnosis not present

## 2021-04-15 DIAGNOSIS — F411 Generalized anxiety disorder: Secondary | ICD-10-CM | POA: Diagnosis not present

## 2021-05-05 DIAGNOSIS — Z85828 Personal history of other malignant neoplasm of skin: Secondary | ICD-10-CM | POA: Diagnosis not present

## 2021-05-05 DIAGNOSIS — L578 Other skin changes due to chronic exposure to nonionizing radiation: Secondary | ICD-10-CM | POA: Diagnosis not present

## 2021-05-05 DIAGNOSIS — L570 Actinic keratosis: Secondary | ICD-10-CM | POA: Diagnosis not present

## 2021-05-05 DIAGNOSIS — L814 Other melanin hyperpigmentation: Secondary | ICD-10-CM | POA: Diagnosis not present

## 2021-05-05 DIAGNOSIS — L300 Nummular dermatitis: Secondary | ICD-10-CM | POA: Diagnosis not present

## 2021-05-13 DIAGNOSIS — M13132 Monoarthritis, not elsewhere classified, left wrist: Secondary | ICD-10-CM | POA: Diagnosis not present

## 2021-05-13 DIAGNOSIS — M1812 Unilateral primary osteoarthritis of first carpometacarpal joint, left hand: Secondary | ICD-10-CM | POA: Diagnosis not present

## 2021-05-13 DIAGNOSIS — G8918 Other acute postprocedural pain: Secondary | ICD-10-CM | POA: Diagnosis not present

## 2021-05-26 DIAGNOSIS — T7840XA Allergy, unspecified, initial encounter: Secondary | ICD-10-CM | POA: Diagnosis not present

## 2021-05-26 DIAGNOSIS — M180 Bilateral primary osteoarthritis of first carpometacarpal joints: Secondary | ICD-10-CM | POA: Diagnosis not present

## 2021-05-26 DIAGNOSIS — Z4789 Encounter for other orthopedic aftercare: Secondary | ICD-10-CM | POA: Diagnosis not present

## 2021-06-09 DIAGNOSIS — M25632 Stiffness of left wrist, not elsewhere classified: Secondary | ICD-10-CM | POA: Diagnosis not present

## 2021-06-09 DIAGNOSIS — Z4789 Encounter for other orthopedic aftercare: Secondary | ICD-10-CM | POA: Diagnosis not present

## 2021-06-14 DIAGNOSIS — M25632 Stiffness of left wrist, not elsewhere classified: Secondary | ICD-10-CM | POA: Diagnosis not present

## 2021-06-23 DIAGNOSIS — M25632 Stiffness of left wrist, not elsewhere classified: Secondary | ICD-10-CM | POA: Diagnosis not present

## 2021-06-28 DIAGNOSIS — M25632 Stiffness of left wrist, not elsewhere classified: Secondary | ICD-10-CM | POA: Diagnosis not present

## 2021-07-07 DIAGNOSIS — M25632 Stiffness of left wrist, not elsewhere classified: Secondary | ICD-10-CM | POA: Diagnosis not present

## 2021-07-07 DIAGNOSIS — Z4789 Encounter for other orthopedic aftercare: Secondary | ICD-10-CM | POA: Diagnosis not present

## 2021-07-07 DIAGNOSIS — M79641 Pain in right hand: Secondary | ICD-10-CM | POA: Diagnosis not present

## 2021-07-14 DIAGNOSIS — M25632 Stiffness of left wrist, not elsewhere classified: Secondary | ICD-10-CM | POA: Diagnosis not present

## 2021-07-20 ENCOUNTER — Encounter: Payer: Self-pay | Admitting: Adult Health

## 2021-07-20 ENCOUNTER — Telehealth (INDEPENDENT_AMBULATORY_CARE_PROVIDER_SITE_OTHER): Payer: BC Managed Care – PPO | Admitting: Adult Health

## 2021-07-20 VITALS — BP 125/79 | HR 80 | Temp 98.7°F | Ht 71.0 in | Wt 190.0 lb

## 2021-07-20 DIAGNOSIS — J0100 Acute maxillary sinusitis, unspecified: Secondary | ICD-10-CM | POA: Diagnosis not present

## 2021-07-20 DIAGNOSIS — R051 Acute cough: Secondary | ICD-10-CM

## 2021-07-20 MED ORDER — HYDROCODONE BIT-HOMATROP MBR 5-1.5 MG/5ML PO SOLN: 5.0000 mL | Freq: Three times a day (TID) | ORAL | 0 refills | Status: DC | PRN

## 2021-07-20 MED ORDER — DOXYCYCLINE HYCLATE 100 MG PO CAPS
100.0000 mg | ORAL_CAPSULE | Freq: Two times a day (BID) | ORAL | 0 refills | Status: DC
Start: 2021-07-20 — End: 2021-08-09

## 2021-07-20 NOTE — Progress Notes (Signed)
Virtual Visit via Video Note  I connected with [name] [name] on 07/20/21 at  3:30 PM EST by a video enabled telemedicine application and verified that I am speaking with the correct person using two identifiers.  Location patient: home Location provider:work or home office Persons participating in the virtual visit: patient, provider  I discussed the limitations of evaluation and management by telemedicine and the availability of in person appointments. The patient expressed understanding and agreed to proceed.   HPI: 66 year old male, patient of Dr. Jenny Reichmann who I am seeing today for an acute issue.  His symptoms started roughly 1 week ago with the mild cough and sinus congestion.  Symptoms stayed stable until yesterday when he "got hit by [place]".  Symptoms now include sore throat, somewhat productive cough, sinus pain and pressure, sinus congestion, painful feeling in his teeth, and headache.  Has not had any fevers or chills.  Used Alka-Seltzer plus last night without resolution.  Was not able to sleep due to cough and congestion.  Took a COVID test at home which was negative.   ROS: See pertinent positives and negatives per HPI.  Past Medical History:  Diagnosis Date   Anxiety    GERD (gastroesophageal reflux disease)    History of colon polyps    History of kidney stones    History of skin cancer    squamous   Hyperlipidemia    Kidney stones    RBBB    NOTED IN 2006    Past Surgical History:  Procedure Laterality Date   CARDIOVASCULAR STRESS TEST  09-06-2007  DR Johnsie Cancel   NORMAL NUCLEAR STUDY/  NO ISCHEMIA/  EF 53%   COLONOSCOPY  2007  POLYPECTOMY/   2012  NEGATIVE   neg 07/06/2099   CYSTOSCOPY W/ URETERAL STENT REMOVAL Bilateral 07/07/2013   Procedure: CYSTOSCOPY WITH STENT REMOVAL;  Surgeon: Alexis Frock, MD;  Location: [facility];  Service: Urology;  Laterality: Bilateral;   CYSTOSCOPY WITH RETROGRADE PYELOGRAM, URETEROSCOPY AND STENT PLACEMENT  Bilateral 06/18/2013   Procedure: CYSTOSCOPY WITH RETROGRADE PYELOGRAM, URETEROSCOPY AND STENT PLACEMENT;  Surgeon: Alexis Frock, MD;  Location: [facility];  Service: Urology;  Laterality: Bilateral;   EXTRACORPOREAL SHOCK WAVE LITHOTRIPSY Left JUNE 2009   HOLMIUM LASER APPLICATION Bilateral 88/50/2774   Procedure: HOLMIUM LASER APPLICATION;  Surgeon: Alexis Frock, MD;  Location: [facility];  Service: Urology;  Laterality: Bilateral;   KNEE ARTHROSCOPY W/ MENISCAL REPAIR Right MAY 2014   LEFT URETEROSCOPIC STONE EXTRACTION  04-04-2008   RETINAL TEAR REPAIR CRYOTHERAPY Left 09/2014   Repaired by laser surgery   SHOULDER SURGERY Right    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 Laurelyn Sickle   TONSILLECTOMY AND ADENOIDECTOMY  AS CHILD   UPPER GASTROINTESTINAL ENDOSCOPY  2011   GERD    Family History  Problem Relation Age of Onset   Hypertension Mother    Cancer Mother        breast & skin   Dementia Mother    Heart disease Father        MI in 10s, CBAG   Diabetes Father    Heart disease Maternal Grandmother        MI in 82s   Heart disease Maternal Grandfather        Mi in 23s   Pancreatic cancer Paternal Aunt    Crohn's disease  Cousin        mother side  of the family   Stroke Neg Hx    Colon cancer Neg Hx    Liver cancer Neg Hx        Current Outpatient Medications:    aspirin 81 MG tablet, Take 81 mg by mouth daily., Disp: , Rfl:    buPROPion (WELLBUTRIN XL) 300 MG 24 hr tablet, Take 300 mg by mouth daily., Disp: , Rfl:    doxycycline (VIBRAMYCIN) 100 MG capsule, Take 1 capsule (100 mg total) by mouth 2 (two) times daily., Disp: 14 capsule, Rfl: 0   HYDROcodone bit-homatropine (HYCODAN) 5-1.5 MG/5ML syrup, Take 5 mLs by mouth every 8 (eight) hours as needed for cough., Disp: 120 mL, Rfl: 0   indapamide (LOZOL) 2.5 MG tablet, Take 2.5 mg by mouth daily.,  Disp: , Rfl:    LORazepam (ATIVAN) 1 MG tablet, Take 1 mg by mouth 2 (two) times daily. , Disp: , Rfl:    omeprazole (PRILOSEC) 20 MG capsule, TAKE 1 CAPSULE BY MOUTH EVERY DAY, Disp: 90 capsule, Rfl: 3   ondansetron (ZOFRAN) 4 MG tablet, Take 1 tablet (4 mg total) by mouth every 8 (eight) hours as needed for nausea or vomiting., Disp: 20 tablet, Rfl: 0   rosuvastatin (CRESTOR) 20 MG tablet, TAKE 1 TABLET BY MOUTH EVERY DAY, Disp: 90 tablet, Rfl: 3   sildenafil (VIAGRA) 100 MG tablet, Take 0.5-1 tablets (50-100 mg total) by mouth daily as needed for erectile dysfunction., Disp: 5 tablet, Rfl: 11   TURMERIC PO, Take 2,000 mg by mouth daily., Disp: , Rfl:   EXAM:  VITALS per patient if applicable:  GENERAL: alert, oriented, appears well and in no acute distress  HEENT: atraumatic, conjunttiva clear, no obvious abnormalities on inspection of external nose and ears  NECK: normal movements of the head and neck  LUNGS: on inspection no signs of respiratory distress, breathing rate appears normal, no obvious gross SOB, gasping or wheezing  CV: no obvious cyanosis  MS: moves all visible extremities without noticeable abnormality  PSYCH/NEURO: pleasant and cooperative, no obvious depression or anxiety, speech and thought processing grossly intact  ASSESSMENT AND PLAN:  Discussed the following assessment and plan:  1. Acute non-recurrent maxillary sinusitis -Treat for bacterial sinusitis due to worsening symptoms.  Advised follow-up in the next 3 to 4 days with myself or PCP if not improved - doxycycline (VIBRAMYCIN) 100 MG capsule; Take 1 capsule (100 mg total) by mouth 2 (two) times daily.  Dispense: 14 capsule; Refill: 0  2. Acute cough  - HYDROcodone bit-homatropine (HYCODAN) 5-1.5 MG/5ML syrup; Take 5 mLs by mouth every 8 (eight) hours as needed for cough.  Dispense: 120 mL; Refill: 0 -Had Hycodan in the past and did well with this medication.     I discussed the assessment and  treatment plan with the patient. The patient was provided an opportunity to ask questions and all were answered. The patient agreed with the plan and demonstrated an understanding of the instructions.   The patient was advised to call back or seek an in-person evaluation if the symptoms worsen or if the condition fails to improve as anticipated.   Dorothyann Peng, NP

## 2021-07-28 DIAGNOSIS — M25632 Stiffness of left wrist, not elsewhere classified: Secondary | ICD-10-CM | POA: Diagnosis not present

## 2021-08-02 ENCOUNTER — Other Ambulatory Visit (INDEPENDENT_AMBULATORY_CARE_PROVIDER_SITE_OTHER): Payer: BC Managed Care – PPO

## 2021-08-02 DIAGNOSIS — E559 Vitamin D deficiency, unspecified: Secondary | ICD-10-CM

## 2021-08-02 DIAGNOSIS — E7849 Other hyperlipidemia: Secondary | ICD-10-CM | POA: Diagnosis not present

## 2021-08-02 DIAGNOSIS — R739 Hyperglycemia, unspecified: Secondary | ICD-10-CM

## 2021-08-02 DIAGNOSIS — Z125 Encounter for screening for malignant neoplasm of prostate: Secondary | ICD-10-CM

## 2021-08-02 DIAGNOSIS — Z0000 Encounter for general adult medical examination without abnormal findings: Secondary | ICD-10-CM

## 2021-08-02 LAB — BASIC METABOLIC PANEL
BUN: 20 mg/dL (ref 6–23)
CO2: 31 mEq/L (ref 19–32)
Calcium: 9.8 mg/dL (ref 8.4–10.5)
Chloride: 100 mEq/L (ref 96–112)
Creatinine, Ser: 0.93 mg/dL (ref 0.40–1.50)
GFR: 85.75 mL/min (ref >60.00)
Glucose, Bld: 95 mg/dL (ref 70–99)
Potassium: 4 mEq/L (ref 3.5–5.1)
Sodium: 138 mEq/L (ref 135–145)

## 2021-08-02 LAB — LIPID PANEL
Cholesterol: 177 mg/dL (ref 0–200)
HDL: 56 mg/dL (ref >39.00)
LDL Cholesterol: 84 mg/dL (ref 0–99)
NonHDL: 120.94
Total CHOL/HDL Ratio: 3
Triglycerides: 186 mg/dL — ABNORMAL HIGH (ref 0.0–149.0)
VLDL: 37.2 mg/dL (ref 0.0–40.0)

## 2021-08-02 LAB — URINALYSIS, ROUTINE W REFLEX MICROSCOPIC
Bilirubin Urine: NEGATIVE
Hgb urine dipstick: NEGATIVE
Ketones, ur: NEGATIVE
Leukocytes,Ua: NEGATIVE
Nitrite: NEGATIVE
RBC / HPF: NONE SEEN (ref 0–?)
Specific Gravity, Urine: 1.015 (ref 1.000–1.030)
Total Protein, Urine: NEGATIVE
Urine Glucose: NEGATIVE
Urobilinogen, UA: 0.2 (ref 0.0–1.0)
pH: 7 (ref 5.0–8.0)

## 2021-08-02 LAB — HEMOGLOBIN A1C: Hgb A1c MFr Bld: 5.5 % (ref 4.6–6.5)

## 2021-08-02 LAB — HEPATIC FUNCTION PANEL
ALT: 28 U/L (ref 0–53)
AST: 19 U/L (ref 0–37)
Albumin: 4.3 g/dL (ref 3.5–5.2)
Alkaline Phosphatase: 56 U/L (ref 39–117)
Bilirubin, Direct: 0.1 mg/dL (ref 0.0–0.3)
Total Bilirubin: 0.8 mg/dL (ref 0.2–1.2)
Total Protein: 6.5 g/dL (ref 6.0–8.3)

## 2021-08-02 LAB — CBC WITH DIFFERENTIAL/PLATELET
Basophils Absolute: 0 10*3/uL (ref 0.0–0.1)
Basophils Relative: 0.4 % (ref 0.0–3.0)
Eosinophils Absolute: 0.2 10*3/uL (ref 0.0–0.7)
Eosinophils Relative: 2.5 % (ref 0.0–5.0)
HCT: 46.4 % (ref 39.0–52.0)
Hemoglobin: 15.3 g/dL (ref 13.0–17.0)
Lymphocytes Relative: 24.4 % (ref 12.0–46.0)
Lymphs Abs: 1.9 10*3/uL (ref 0.7–4.0)
MCHC: 33 g/dL (ref 30.0–36.0)
MCV: 90.2 fl (ref 78.0–100.0)
Monocytes Absolute: 0.6 10*3/uL (ref 0.1–1.0)
Monocytes Relative: 8.1 % (ref 3.0–12.0)
Neutro Abs: 5.1 10*3/uL (ref 1.4–7.7)
Neutrophils Relative %: 64.6 % (ref 43.0–77.0)
Platelets: 258 10*3/uL (ref 150.0–400.0)
RBC: 5.14 Mil/uL (ref 4.22–5.81)
RDW: 13.2 % (ref 11.5–15.5)
WBC: 7.9 10*3/uL (ref 4.0–10.5)

## 2021-08-02 LAB — VITAMIN D 25 HYDROXY (VIT D DEFICIENCY, FRACTURES): VITD: 30.6 ng/mL (ref 30.00–100.00)

## 2021-08-02 LAB — PSA: PSA: 1.38 ng/mL (ref 0.10–4.00)

## 2021-08-02 LAB — TSH: TSH: 2.36 u[IU]/mL (ref 0.35–5.50)

## 2021-08-04 DIAGNOSIS — M25632 Stiffness of left wrist, not elsewhere classified: Secondary | ICD-10-CM | POA: Diagnosis not present

## 2021-08-09 ENCOUNTER — Ambulatory Visit (INDEPENDENT_AMBULATORY_CARE_PROVIDER_SITE_OTHER): Payer: BC Managed Care – PPO | Admitting: Internal Medicine

## 2021-08-09 ENCOUNTER — Other Ambulatory Visit: Payer: Self-pay

## 2021-08-09 ENCOUNTER — Encounter: Payer: Self-pay | Admitting: Internal Medicine

## 2021-08-09 VITALS — BP 112/72 | HR 77 | Temp 98.9°F | Ht 71.0 in | Wt 186.0 lb

## 2021-08-09 DIAGNOSIS — R251 Tremor, unspecified: Secondary | ICD-10-CM

## 2021-08-09 DIAGNOSIS — Z0001 Encounter for general adult medical examination with abnormal findings: Secondary | ICD-10-CM | POA: Diagnosis not present

## 2021-08-09 DIAGNOSIS — E7849 Other hyperlipidemia: Secondary | ICD-10-CM | POA: Diagnosis not present

## 2021-08-09 DIAGNOSIS — M25632 Stiffness of left wrist, not elsewhere classified: Secondary | ICD-10-CM | POA: Diagnosis not present

## 2021-08-09 DIAGNOSIS — Z23 Encounter for immunization: Secondary | ICD-10-CM | POA: Diagnosis not present

## 2021-08-09 DIAGNOSIS — M79641 Pain in right hand: Secondary | ICD-10-CM | POA: Diagnosis not present

## 2021-08-09 DIAGNOSIS — E559 Vitamin D deficiency, unspecified: Secondary | ICD-10-CM

## 2021-08-09 DIAGNOSIS — E538 Deficiency of other specified B group vitamins: Secondary | ICD-10-CM

## 2021-08-09 DIAGNOSIS — R739 Hyperglycemia, unspecified: Secondary | ICD-10-CM

## 2021-08-09 NOTE — Patient Instructions (Addendum)
You had the Prevnar 20 pneumonia shot today  Please take OTC Vitamin D3 at 2000 units per day, indefinitely  We have discussed the Cardiac CT Score test to measure the calcification level (if any) in your heart arteries.  This test has been ordered in our [facility], so please call [facility] directly, as they prefer this, at [phone] to be scheduled.  Please continue all other medications as before, and refills have been done if requested.  Please have the pharmacy call with any other refills you may need.  Please continue your efforts at being more active, low cholesterol diet, and weight control.  You are otherwise up to date with prevention measures today.  Please keep your appointments with your specialists as you may have planned  You will be contacted regarding the referral for: Neurology  Please make an Appointment to return for your 1 year visit, or sooner if needed, with Lab testing by Appointment as well, to be done about 3-5 days before at the [facility] (so this is for TWO appointments - please see the scheduling desk as you leave)   Due to the ongoing Covid 19 pandemic, our lab now requires an appointment for any labs done at our office.  If you need labs done and do not have an appointment, please call our office ahead of time to schedule before presenting to the lab for your testing.

## 2021-08-09 NOTE — Assessment & Plan Note (Signed)
Last vitamin D Lab Results  Component Value Date   VD25OH 30.60 08/02/2021   Low, to start oral replacement

## 2021-08-09 NOTE — Assessment & Plan Note (Signed)
D/w pt, likely essential tremor, not likely to tolerate BB tx with low normal BP at baseline, for neurology referral

## 2021-08-09 NOTE — Assessment & Plan Note (Addendum)
Lab Results  Component Value Date   LDLCALC 84 08/02/2021   Stable, pt to continue current statin crestor 20, also for card ct score

## 2021-08-09 NOTE — Assessment & Plan Note (Signed)
Lab Results  °Component Value Date  ° HGBA1C 5.5 08/02/2021  ° °Stable, pt to continue current medical treatment  - diet ° °

## 2021-08-09 NOTE — Assessment & Plan Note (Addendum)
Age and sex appropriate education and counseling updated with regular exercise and diet Referrals for preventative services - none needed Immunizations addressed - declines covid booster, but for prevnar 20 Smoking counseling  - none needed Evidence for depression or other mood disorder - none significant Most recent labs reviewed. I have personally reviewed and have noted: 1) the patient's medical and social history 2) The patient's current medications and supplements 3) The patient's height, weight, and BMI have been recorded in the chart

## 2021-08-09 NOTE — Progress Notes (Signed)
Patient ID: [name] [name], male   DOB: [birth year], 66 y.o.   MRN: [id number]         Chief Complaint:: wellness exam and low vit d, tremor, hld, hyperglycemia       HPI:  [name] [name] is a 66 y.o. male here for wellness exam; declines covid booster, o/w up to date                        Also not taking Vit D.  Has 6 mo worsening bilateral hand shaking at times worse to the left hand.  Pt denies chest pain, increased sob or doe, wheezing, orthopnea, PND, increased LE swelling, palpitations, dizziness or syncope.   Pt denies polydipsia, polyuria, or new focal neuro s/s.   Pt denies fever, wt loss, night sweats, loss of appetite, or other constitutional symptoms  Is interested in card Ct score.     Wt Readings from Last 3 Encounters:  08/09/21 186 lb (84.4 kg)  07/20/21 190 lb (86.2 kg)  08/06/20 190 lb 2 oz (86.2 kg)   BP Readings from Last 3 Encounters:  08/09/21 112/72  07/20/21 125/79  08/06/20 124/80   Immunization History  Administered Date(s) Administered   Influenza Split 05/07/2012   Influenza Whole 03/27/2008, 06/23/2010   Influenza, High Dose Seasonal PF 07/06/2020   Influenza,inj,Quad PF,6+ Mos 06/19/2014, 04/29/2015, 05/25/2016, 04/13/2019   Influenza-Unspecified 04/09/2017, 04/09/2018, 04/18/2021   PFIZER(Purple Top)SARS-COV-2 Vaccination 11/22/2019   PNEUMOCOCCAL CONJUGATE-20 08/09/2021   Tdap 10/16/2011   Zoster Recombinat (Shingrix) 02/07/2017, 06/14/2017   There are no preventive care reminders to display for this patient.     Past Medical History:  Diagnosis Date   Anxiety    GERD (gastroesophageal reflux disease)    History of colon polyps    History of kidney stones    History of skin cancer    squamous   Hyperlipidemia    Kidney stones    RBBB    NOTED IN 2006   Past Surgical History:  Procedure Laterality Date   CARDIOVASCULAR STRESS TEST  09-06-2007  DR Johnsie Cancel   NORMAL NUCLEAR STUDY/  NO ISCHEMIA/  EF 53%   COLONOSCOPY  2007   POLYPECTOMY/   2012  NEGATIVE   neg 07/06/2099   CYSTOSCOPY W/ URETERAL STENT REMOVAL Bilateral 07/07/2013   Procedure: CYSTOSCOPY WITH STENT REMOVAL;  Surgeon: Alexis Frock, MD;  Location: [facility];  Service: Urology;  Laterality: Bilateral;   CYSTOSCOPY WITH RETROGRADE PYELOGRAM, URETEROSCOPY AND STENT PLACEMENT Bilateral 06/18/2013   Procedure: CYSTOSCOPY WITH RETROGRADE PYELOGRAM, URETEROSCOPY AND STENT PLACEMENT;  Surgeon: Alexis Frock, MD;  Location: [facility];  Service: Urology;  Laterality: Bilateral;   EXTRACORPOREAL SHOCK WAVE LITHOTRIPSY Left JUNE 2009   HOLMIUM LASER APPLICATION Bilateral 19/37/9024   Procedure: HOLMIUM LASER APPLICATION;  Surgeon: Alexis Frock, MD;  Location: [facility];  Service: Urology;  Laterality: Bilateral;   KNEE ARTHROSCOPY W/ MENISCAL REPAIR Right MAY 2014   LEFT URETEROSCOPIC STONE EXTRACTION  04-04-2008   RETINAL TEAR REPAIR CRYOTHERAPY Left 09/2014   Repaired by laser surgery   SHOULDER SURGERY Right    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 Laurelyn Sickle   TONSILLECTOMY AND ADENOIDECTOMY  AS CHILD   UPPER GASTROINTESTINAL ENDOSCOPY  2011   GERD    reports that he has never smoked. He has  never used smokeless tobacco. He reports current alcohol use of about 14.0 standard drinks per week. He reports that he does not use drugs. family history includes Cancer in his mother; Crohn's disease in his cousin; Dementia in his mother; Diabetes in his father; Heart disease in his father, maternal grandfather, and maternal grandmother; Hypertension in his mother; Pancreatic cancer in his paternal aunt. Allergies  Allergen Reactions   Codeine Nausea And Vomiting   Current Outpatient Medications on File Prior to Visit  Medication Sig Dispense Refill   aspirin 81 MG tablet Take 81 mg by mouth daily.     buPROPion (WELLBUTRIN  XL) 300 MG 24 hr tablet Take 300 mg by mouth daily.     indapamide (LOZOL) 2.5 MG tablet Take 2.5 mg by mouth daily.     LORazepam (ATIVAN) 1 MG tablet Take 1 mg by mouth 2 (two) times daily.      omeprazole (PRILOSEC) 20 MG capsule TAKE 1 CAPSULE BY MOUTH EVERY DAY 90 capsule 3   rosuvastatin (CRESTOR) 20 MG tablet TAKE 1 TABLET BY MOUTH EVERY DAY 90 tablet 3   sildenafil (VIAGRA) 100 MG tablet Take 0.5-1 tablets (50-100 mg total) by mouth daily as needed for erectile dysfunction. 5 tablet 11   No current facility-administered medications on file prior to visit.        ROS:  All others reviewed and negative.  Objective        PE:  BP 112/72 (BP Location: Right Arm, Patient Position: Sitting, Cuff Size: Large)    Pulse 77    Temp 98.9 F (37.2 C) (Oral)    Ht 5\' 11"  (1.803 m)    Wt 186 lb (84.4 kg)    SpO2 96%    BMI 25.94 kg/m                 Constitutional: Pt appears in NAD               HENT: Head: NCAT.                Right Ear: External ear normal.                 Left Ear: External ear normal.                Eyes: . Pupils are equal, round, and reactive to light. Conjunctivae and EOM are normal               Nose: without d/c or deformity               Neck: Neck supple. Gross normal ROM               Cardiovascular: Normal rate and regular rhythm.                 Pulmonary/Chest: Effort normal and breath sounds without rales or wheezing.                Abd:  Soft, NT, ND, + BS, no organomegaly               Neurological: Pt is alert. At baseline orientation, motor grossly intact               Skin: Skin is warm. No rashes, no other new lesions, LE edema - none               Psychiatric: Pt behavior is normal without agitation   Micro: none  Cardiac  tracings I have personally interpreted today:  none  Pertinent Radiological findings (summarize): none   Lab Results  Component Value Date   WBC 7.9 08/02/2021   HGB 15.3 08/02/2021   HCT 46.4 08/02/2021   PLT 258.0  08/02/2021   GLUCOSE 95 08/02/2021   CHOL 177 08/02/2021   TRIG 186.0 (H) 08/02/2021   HDL 56.00 08/02/2021   LDLDIRECT 130.7 10/16/2011   LDLCALC 84 08/02/2021   ALT 28 08/02/2021   AST 19 08/02/2021   NA 138 08/02/2021   K 4.0 08/02/2021   CL 100 08/02/2021   CREATININE 0.93 08/02/2021   BUN 20 08/02/2021   CO2 31 08/02/2021   TSH 2.36 08/02/2021   PSA 1.38 08/02/2021   HGBA1C 5.5 08/02/2021   Assessment/Plan:  DAIJON WENKE is a 66 y.o. White or Caucasian [1] male with  has a past medical history of Anxiety, GERD (gastroesophageal reflux disease), History of colon polyps, History of kidney stones, History of skin cancer, Hyperlipidemia, Kidney stones, and RBBB.  Vitamin D deficiency Last vitamin D Lab Results  Component Value Date   VD25OH 30.60 08/02/2021   Low, to start oral replacement   Encounter for well adult exam with abnormal findings Age and sex appropriate education and counseling updated with regular exercise and diet Referrals for preventative services - none needed Immunizations addressed - declines covid booster, but for prevnar 20 Smoking counseling  - none needed Evidence for depression or other mood disorder - none significant Most recent labs reviewed. I have personally reviewed and have noted: 1) the patient's medical and social history 2) The patient's current medications and supplements 3) The patient's height, weight, and BMI have been recorded in the chart   HLD (hyperlipidemia) Lab Results  Component Value Date   LDLCALC 84 08/02/2021   Stable, pt to continue current statin crestor 20, also for card ct score   Hyperglycemia Lab Results  Component Value Date   HGBA1C 5.5 08/02/2021   Stable, pt to continue current medical treatment  - diet   Tremor D/w pt, likely essential tremor, not likely to tolerate BB tx with low normal BP at baseline, for neurology referral  Followup: Return in about 1 year (around 08/09/2022).  Cathlean Cower, MD 08/09/2021 7:26 PM [facility]

## 2021-08-10 ENCOUNTER — Encounter: Payer: Self-pay | Admitting: Neurology

## 2021-08-12 NOTE — Progress Notes (Signed)
Assessment/Plan:   Essential Tremor.  - We discussed nature and pathophysiology.  We discussed that this can continue to gradually get worse with time.  We discussed that some medications can worsen this, as can caffeine use.  We discussed medication therapy as well as surgical therapy.  He probably is not a beta-blocker candidate due to pulse in the 60s.  He has had a history of nephrolithiasis, so would avoid topiramate.  He would be a good candidate for primidone.  Ultimately, the patient decided to hold off on medication treatment, which I think is reasonable given fairly mild degree of symptoms right now.   -I did reassure him that he has no evidence of Parkinson's disease currently. -Patient education provided. -I am happy to see the patient back on an as-needed basis.   Subjective:   [name] [name] was seen today in the movement disorders clinic for neurologic consultation at the request of Biagio Borg, MD.  The consultation is for the evaluation of tremor.  Medical records made available to me are reviewed.  Tremor thought to represent essential tremor, but primary care felt patient may not tolerate beta-blocker because of baseline low normal blood pressure.  Patient sent here for further evaluation.    Tremor: Yes.     How long has it been going on? Unsure but thinks few years; initially patient didn't notice it but wife did.  Pt now notes it when working at work but not when working from home.  At rest or with activation?  Usually activation  When is it noted the most?  Trying to reach keyboard/typing; handwriting is difficult because of tremor  Fam hx of tremor?  No.  Located where?  L>R; he is R hand dominant  Affected by caffeine:  unknown (drinks 1-2 cups in AM)  Affected by alcohol:  No. (drinks 2 drinks/night)  Affected by stress:  Yes.    Affected by fatigue:  No.  Spills soup if on spoon:  No. But has to be careful  Affects ADL's (tying shoes, brushing teeth, etc):   No.  Tremor inducing meds:  No.  Other Specific Symptoms:  Voice: no change Sleep: no issue  Vivid Dreams:  No.  Acting out dreams:  No. Wet Pillows: No. Postural symptoms:  No.  Falls?  No. Bradykinesia symptoms: no bradykinesia noted Loss of smell:  No. Loss of taste:  No. Urinary Incontinence:  No. Difficulty Swallowing:  No. Memory changes:  Yes.  , some word finding trouble; hesitate in the middle of sentence N/V:  No. Lightheaded:  No.  Syncope: No. Diplopia:  No. Dyskinesia:  No.  Neuroimaging of the brain has not previously been performed.     ALLERGIES:   Allergies  Allergen Reactions   Codeine Nausea And Vomiting    CURRENT MEDICATIONS:  Current Outpatient Medications  Medication Instructions   aspirin 81 mg, Daily   buPROPion (WELLBUTRIN XL) 300 mg, Daily   Cholecalciferol (VITAMIN D3) 100000 UNIT/GM POWD Does not apply   indapamide (LOZOL) 2.5 mg, Daily   LORazepam (ATIVAN) 1 mg, Oral, 2 times daily   omeprazole (PRILOSEC) 20 MG capsule TAKE 1 CAPSULE BY MOUTH EVERY DAY   rosuvastatin (CRESTOR) 20 MG tablet TAKE 1 TABLET BY MOUTH EVERY DAY   sildenafil (VIAGRA) 50-100 mg, Oral, Daily PRN    Objective:   PHYSICAL EXAMINATION:    VITALS:   Vitals:   08/16/21 0817  BP: 110/70  Pulse: 68  SpO2: 99%  Weight:  188 lb 12.8 oz (85.6 kg)  Height: 5\' 11"  (1.803 m)    GEN:  The patient appears stated age and is in NAD. HEENT:  Normocephalic, atraumatic.  The mucous membranes are moist. The superficial temporal arteries are without ropiness or tenderness. CV:  RRR Lungs:  CTAB Neck/HEME:  There are no carotid bruits bilaterally.  Neurological examination:  Orientation: The patient is alert and oriented x3.  Cranial nerves: There is good facial symmetry.  Extraocular muscles are intact. The visual fields are full to confrontational testing. The speech is fluent and clear. Soft palate rises symmetrically and there is no tongue deviation. Hearing is  intact to conversational tone. Sensation: Sensation is intact to light touch throughout (facial, trunk, extremities). Vibration is intact at the bilateral big toe. There is no extinction with double simultaneous stimulation.  Motor: Strength is 5/5 in the bilateral upper and lower extremities.   Shoulder shrug is equal and symmetric.  There is no pronator drift. Deep tendon reflexes: Deep tendon reflexes are 2/4 at the bilateral biceps, triceps, brachioradialis, patella and achilles. Plantar responses are downgoing bilaterally.  Movement examination: Tone: There is normal tone in the bilateral upper extremities.  The tone in the lower extremities is normal.  Abnormal movements: No rest tremor, even with distraction procedures.  No postural tremor.  There is mild intention tremor, left greater than right.  Minimal tremor with Archimedes spirals (none on the right, mild on the left).  Very minimal trouble pouring water from 1 glass to another. Coordination:  There is no decremation with RAM's, with any form of RAMS, including alternating supination and pronation of the forearm, hand opening and closing, finger taps, heel taps and toe taps. Gait and Station: The patient has no difficulty arising out of a deep-seated chair without the use of the hands. The patient's stride length is good.  He is able to ambulate in a tandem fashion.   I have reviewed and interpreted the following labs independently   Chemistry      Component Value Date/Time   NA 138 08/02/2021 0815   K 4.0 08/02/2021 0815   CL 100 08/02/2021 0815   CO2 31 08/02/2021 0815   BUN 20 08/02/2021 0815   CREATININE 0.93 08/02/2021 0815      Component Value Date/Time   CALCIUM 9.8 08/02/2021 0815   ALKPHOS 56 08/02/2021 0815   AST 19 08/02/2021 0815   ALT 28 08/02/2021 0815   BILITOT 0.8 08/02/2021 0815      Lab Results  Component Value Date   TSH 2.36 08/02/2021   Lab Results  Component Value Date   WBC 7.9 08/02/2021   HGB  15.3 08/02/2021   HCT 46.4 08/02/2021   MCV 90.2 08/02/2021   PLT 258.0 08/02/2021     Cc:  Biagio Borg, MD

## 2021-08-16 ENCOUNTER — Other Ambulatory Visit: Payer: Self-pay

## 2021-08-16 ENCOUNTER — Ambulatory Visit: Payer: BC Managed Care – PPO | Admitting: Neurology

## 2021-08-16 ENCOUNTER — Encounter: Payer: Self-pay | Admitting: Neurology

## 2021-08-16 VITALS — BP 110/70 | HR 68 | Ht 71.0 in | Wt 188.8 lb

## 2021-08-16 DIAGNOSIS — G250 Essential tremor: Secondary | ICD-10-CM

## 2021-08-16 NOTE — Patient Instructions (Signed)
Essential Tremor °A tremor is trembling or shaking that a person cannot control. Most tremors affect the hands or arms. Tremors can also affect the head, vocal cords, legs, and other parts of the body. Essential tremor is a tremor without a known cause. Usually, it occurs while a person is trying to perform an action. It tends to get worse gradually as a person ages. °What are the causes? °The cause of this condition is not known. °What increases the risk? °You are more likely to develop this condition if: °You have a family member with essential tremor. °You are age 40 or older. °You take certain medicines. °What are the signs or symptoms? °The main sign of a tremor is a rhythmic shaking of certain parts of your body that is uncontrolled and unintentional. You may: °Have difficulty eating with a spoon or fork. °Have difficulty writing. °Nod your head up and down or side to side. °Have a quivering voice. °The shaking may: °Get worse over time. °Come and go. °Be more noticeable on one side of your body. °Get worse due to stress, fatigue, caffeine, and extreme heat or cold. °How is this diagnosed? °This condition may be diagnosed based on: °Your symptoms and medical history. °A physical exam. °There is no single test to diagnose an essential tremor. However, your health care provider may order tests to rule out other causes of your condition. These may include: °Blood and urine tests. °Imaging studies of your brain, such as CT scan and MRI. °A test that measures involuntary muscle movement (electromyogram). °How is this treated? °Treatment for essential tremor depends on the severity of the condition. °Some tremors may go away without treatment. °Mild tremors may not need treatment if they do not affect your day-to-day life. °Severe tremors may need to be treated using one or more of the following options: °Medicines. °Lifestyle changes. °Occupational or physical therapy. °Follow these instructions at  home: °Lifestyle ° °Do not use any products that contain nicotine or tobacco, such as cigarettes and e-cigarettes. If you need help quitting, ask your health care provider. °Limit your caffeine intake as told by your health care provider. °Try to get 8 hours of sleep each night. °Find ways to manage your stress that fits your lifestyle and personality. Consider trying meditation or yoga. °Try to anticipate stressful situations and allow extra time to manage them. °If you are struggling emotionally with the effects of your tremor, consider working with a mental health provider. °General instructions °Take over-the-counter and prescription medicines only as told by your health care provider. °Avoid extreme heat and extreme cold. °Keep all follow-up visits as told by your health care provider. This is important. Visits may include physical therapy visits. °Contact a health care provider if: °You experience any changes in the location or intensity of your tremors. °You start having a tremor after starting a new medicine. °You have tremor with other symptoms, such as: °Numbness. °Tingling. °Pain. °Weakness. °Your tremor gets worse. °Your tremor interferes with your daily life. °You feel down, blue, or sad for at least 2 weeks in a row. °Worrying about your tremor and what other people think about you interferes with your everyday life functions, including relationships, work, or school. °Summary °Essential tremor is a tremor without a known cause. Usually, it occurs when you are trying to perform an action. °You are more likely to develop this condition if you have a family member with essential tremor. °The main sign of a tremor is a rhythmic shaking of   certain parts of your body that is uncontrolled and unintentional. °Treatment for essential tremor depends on the severity of the condition. °This information is not intended to replace advice given to you by your health care provider. Make sure you discuss any questions  you have with your health care provider. °Document Revised: 03/17/2020 Document Reviewed: 03/19/2020 °Elsevier Patient Education © 2022 [employer] ° °

## 2021-08-18 DIAGNOSIS — M25632 Stiffness of left wrist, not elsewhere classified: Secondary | ICD-10-CM | POA: Diagnosis not present

## 2021-08-18 DIAGNOSIS — M79641 Pain in right hand: Secondary | ICD-10-CM | POA: Diagnosis not present

## 2021-08-18 DIAGNOSIS — Z4789 Encounter for other orthopedic aftercare: Secondary | ICD-10-CM | POA: Diagnosis not present

## 2021-09-08 ENCOUNTER — Other Ambulatory Visit: Payer: Self-pay | Admitting: Internal Medicine

## 2021-09-08 NOTE — Telephone Encounter (Signed)
Please refill as per office routine med refill policy (all routine meds to be refilled for 3 mo or monthly (per pt preference) up to one year from last visit, then month to month grace period for 3 mo, then further med refills will have to be denied) ? ?

## 2021-09-16 ENCOUNTER — Other Ambulatory Visit: Payer: Self-pay | Admitting: Internal Medicine

## 2021-09-16 NOTE — Telephone Encounter (Signed)
Please refill as per office routine med refill policy (all routine meds to be refilled for 3 mo or monthly (per pt preference) up to one year from last visit, then month to month grace period for 3 mo, then further med refills will have to be denied) ? ?

## 2021-09-29 ENCOUNTER — Other Ambulatory Visit: Payer: Self-pay

## 2021-09-29 ENCOUNTER — Ambulatory Visit
Admission: RE | Admit: 2021-09-29 | Discharge: 2021-09-29 | Disposition: A | Discharge status: Home / Self Care | Payer: Self-pay | Source: Ambulatory Visit | Attending: Internal Medicine | Admitting: Internal Medicine

## 2021-09-29 DIAGNOSIS — R739 Hyperglycemia, unspecified: Secondary | ICD-10-CM

## 2021-09-29 DIAGNOSIS — E7849 Other hyperlipidemia: Secondary | ICD-10-CM

## 2021-10-02 ENCOUNTER — Encounter: Payer: Self-pay | Admitting: Internal Medicine

## 2021-10-02 DIAGNOSIS — I2510 Atherosclerotic heart disease of native coronary artery without angina pectoris: Secondary | ICD-10-CM | POA: Insufficient documentation

## 2021-10-14 DIAGNOSIS — F330 Major depressive disorder, recurrent, mild: Secondary | ICD-10-CM | POA: Diagnosis not present

## 2021-10-14 DIAGNOSIS — F411 Generalized anxiety disorder: Secondary | ICD-10-CM | POA: Diagnosis not present

## 2021-10-18 DIAGNOSIS — Z125 Encounter for screening for malignant neoplasm of prostate: Secondary | ICD-10-CM | POA: Diagnosis not present

## 2021-10-18 DIAGNOSIS — N200 Calculus of kidney: Secondary | ICD-10-CM | POA: Diagnosis not present

## 2021-10-25 DIAGNOSIS — N200 Calculus of kidney: Secondary | ICD-10-CM | POA: Diagnosis not present

## 2021-11-11 ENCOUNTER — Encounter: Payer: Self-pay | Admitting: Internal Medicine

## 2022-02-22 ENCOUNTER — Encounter: Payer: Self-pay | Admitting: Internal Medicine

## 2022-02-22 NOTE — Telephone Encounter (Signed)
Please to see pt concern, thanks

## 2022-04-13 DIAGNOSIS — F411 Generalized anxiety disorder: Secondary | ICD-10-CM | POA: Diagnosis not present

## 2022-04-13 DIAGNOSIS — F330 Major depressive disorder, recurrent, mild: Secondary | ICD-10-CM | POA: Diagnosis not present

## 2022-05-09 DIAGNOSIS — Z85828 Personal history of other malignant neoplasm of skin: Secondary | ICD-10-CM | POA: Diagnosis not present

## 2022-05-09 DIAGNOSIS — I781 Nevus, non-neoplastic: Secondary | ICD-10-CM | POA: Diagnosis not present

## 2022-05-09 DIAGNOSIS — B351 Tinea unguium: Secondary | ICD-10-CM | POA: Diagnosis not present

## 2022-05-09 DIAGNOSIS — L570 Actinic keratosis: Secondary | ICD-10-CM | POA: Diagnosis not present

## 2022-05-09 DIAGNOSIS — D225 Melanocytic nevi of trunk: Secondary | ICD-10-CM | POA: Diagnosis not present

## 2022-05-10 DIAGNOSIS — G8929 Other chronic pain: Secondary | ICD-10-CM | POA: Diagnosis not present

## 2022-05-10 DIAGNOSIS — S46012A Strain of muscle(s) and tendon(s) of the rotator cuff of left shoulder, initial encounter: Secondary | ICD-10-CM | POA: Diagnosis not present

## 2022-05-10 DIAGNOSIS — M25512 Pain in left shoulder: Secondary | ICD-10-CM | POA: Diagnosis not present

## 2022-05-27 DIAGNOSIS — M75102 Unspecified rotator cuff tear or rupture of left shoulder, not specified as traumatic: Secondary | ICD-10-CM | POA: Diagnosis not present

## 2022-05-27 DIAGNOSIS — M19012 Primary osteoarthritis, left shoulder: Secondary | ICD-10-CM | POA: Diagnosis not present

## 2022-05-27 DIAGNOSIS — R531 Weakness: Secondary | ICD-10-CM | POA: Diagnosis not present

## 2022-05-27 DIAGNOSIS — S46012A Strain of muscle(s) and tendon(s) of the rotator cuff of left shoulder, initial encounter: Secondary | ICD-10-CM | POA: Diagnosis not present

## 2022-05-31 DIAGNOSIS — G8929 Other chronic pain: Secondary | ICD-10-CM | POA: Diagnosis not present

## 2022-05-31 DIAGNOSIS — M25512 Pain in left shoulder: Secondary | ICD-10-CM | POA: Diagnosis not present

## 2022-06-12 DIAGNOSIS — G8929 Other chronic pain: Secondary | ICD-10-CM | POA: Diagnosis not present

## 2022-06-12 DIAGNOSIS — Z789 Other specified health status: Secondary | ICD-10-CM | POA: Diagnosis not present

## 2022-06-12 DIAGNOSIS — Z7409 Other reduced mobility: Secondary | ICD-10-CM | POA: Diagnosis not present

## 2022-06-12 DIAGNOSIS — M25512 Pain in left shoulder: Secondary | ICD-10-CM | POA: Diagnosis not present

## 2022-06-12 DIAGNOSIS — R531 Weakness: Secondary | ICD-10-CM | POA: Diagnosis not present

## 2022-06-19 DIAGNOSIS — Z789 Other specified health status: Secondary | ICD-10-CM | POA: Diagnosis not present

## 2022-06-19 DIAGNOSIS — G8929 Other chronic pain: Secondary | ICD-10-CM | POA: Diagnosis not present

## 2022-06-19 DIAGNOSIS — Z7409 Other reduced mobility: Secondary | ICD-10-CM | POA: Diagnosis not present

## 2022-06-19 DIAGNOSIS — R531 Weakness: Secondary | ICD-10-CM | POA: Diagnosis not present

## 2022-06-19 DIAGNOSIS — M25512 Pain in left shoulder: Secondary | ICD-10-CM | POA: Diagnosis not present

## 2022-06-26 DIAGNOSIS — Z789 Other specified health status: Secondary | ICD-10-CM | POA: Diagnosis not present

## 2022-06-26 DIAGNOSIS — Z7409 Other reduced mobility: Secondary | ICD-10-CM | POA: Diagnosis not present

## 2022-06-26 DIAGNOSIS — G8929 Other chronic pain: Secondary | ICD-10-CM | POA: Diagnosis not present

## 2022-06-26 DIAGNOSIS — R531 Weakness: Secondary | ICD-10-CM | POA: Diagnosis not present

## 2022-06-26 DIAGNOSIS — M25512 Pain in left shoulder: Secondary | ICD-10-CM | POA: Diagnosis not present

## 2022-07-04 DIAGNOSIS — M25512 Pain in left shoulder: Secondary | ICD-10-CM | POA: Diagnosis not present

## 2022-07-04 DIAGNOSIS — G8929 Other chronic pain: Secondary | ICD-10-CM | POA: Diagnosis not present

## 2022-07-04 DIAGNOSIS — R531 Weakness: Secondary | ICD-10-CM | POA: Diagnosis not present

## 2022-07-04 DIAGNOSIS — Z7409 Other reduced mobility: Secondary | ICD-10-CM | POA: Diagnosis not present

## 2022-07-04 DIAGNOSIS — Z789 Other specified health status: Secondary | ICD-10-CM | POA: Diagnosis not present

## 2022-07-12 DIAGNOSIS — M25512 Pain in left shoulder: Secondary | ICD-10-CM | POA: Diagnosis not present

## 2022-07-12 DIAGNOSIS — Z789 Other specified health status: Secondary | ICD-10-CM | POA: Diagnosis not present

## 2022-07-12 DIAGNOSIS — G8929 Other chronic pain: Secondary | ICD-10-CM | POA: Diagnosis not present

## 2022-07-12 DIAGNOSIS — Z7409 Other reduced mobility: Secondary | ICD-10-CM | POA: Diagnosis not present

## 2022-07-12 DIAGNOSIS — R531 Weakness: Secondary | ICD-10-CM | POA: Diagnosis not present

## 2022-07-28 ENCOUNTER — Other Ambulatory Visit: Payer: Self-pay | Admitting: Internal Medicine

## 2022-07-28 NOTE — Telephone Encounter (Signed)
Please refill as per office routine med refill policy (all routine meds to be refilled for 3 mo or monthly (per pt preference) up to one year from last visit, then month to month grace period for 3 mo, then further med refills will have to be denied)

## 2022-08-01 ENCOUNTER — Telehealth: Payer: Self-pay | Admitting: Internal Medicine

## 2022-08-01 NOTE — Telephone Encounter (Signed)
Patient wants to be sure that all his labs are coded as preventative so his insurance will pay 100% - He has a lab appointment this Friday at 8:00.

## 2022-08-01 NOTE — Telephone Encounter (Signed)
I think the coding should work out, as we do the same for other patients

## 2022-08-01 NOTE — Telephone Encounter (Signed)
I am not sure about coding due to insurance

## 2022-08-02 NOTE — Telephone Encounter (Signed)
Patient informed about coding for insurance purposes

## 2022-08-04 ENCOUNTER — Other Ambulatory Visit (INDEPENDENT_AMBULATORY_CARE_PROVIDER_SITE_OTHER): Payer: BC Managed Care – PPO

## 2022-08-04 DIAGNOSIS — E559 Vitamin D deficiency, unspecified: Secondary | ICD-10-CM

## 2022-08-04 DIAGNOSIS — E538 Deficiency of other specified B group vitamins: Secondary | ICD-10-CM

## 2022-08-04 DIAGNOSIS — R739 Hyperglycemia, unspecified: Secondary | ICD-10-CM | POA: Diagnosis not present

## 2022-08-04 DIAGNOSIS — Z0001 Encounter for general adult medical examination with abnormal findings: Secondary | ICD-10-CM | POA: Diagnosis not present

## 2022-08-04 DIAGNOSIS — E7849 Other hyperlipidemia: Secondary | ICD-10-CM | POA: Diagnosis not present

## 2022-08-04 LAB — VITAMIN D 25 HYDROXY (VIT D DEFICIENCY, FRACTURES): VITD: 48.64 ng/mL (ref 30.00–100.00)

## 2022-08-04 LAB — URINALYSIS, ROUTINE W REFLEX MICROSCOPIC
Bilirubin Urine: NEGATIVE
Hgb urine dipstick: NEGATIVE
Ketones, ur: NEGATIVE
Leukocytes,Ua: NEGATIVE
Nitrite: NEGATIVE
Specific Gravity, Urine: >=1.03 — AB (ref 1.000–1.030)
Total Protein, Urine: NEGATIVE
Urine Glucose: NEGATIVE
Urobilinogen, UA: 0.2 (ref 0.0–1.0)
pH: 6 (ref 5.0–8.0)

## 2022-08-04 LAB — LIPID PANEL
Cholesterol: 174 mg/dL (ref 0–200)
HDL: 51.3 mg/dL (ref >39.00)
LDL Cholesterol: 90 mg/dL (ref 0–99)
NonHDL: 122.84
Total CHOL/HDL Ratio: 3
Triglycerides: 163 mg/dL — ABNORMAL HIGH (ref 0.0–149.0)
VLDL: 32.6 mg/dL (ref 0.0–40.0)

## 2022-08-04 LAB — CBC WITH DIFFERENTIAL/PLATELET
Basophils Absolute: 0 10*3/uL (ref 0.0–0.1)
Basophils Relative: 0.6 % (ref 0.0–3.0)
Eosinophils Absolute: 0.2 10*3/uL (ref 0.0–0.7)
Eosinophils Relative: 2.9 % (ref 0.0–5.0)
HCT: 44.1 % (ref 39.0–52.0)
Hemoglobin: 15 g/dL (ref 13.0–17.0)
Lymphocytes Relative: 29.7 % (ref 12.0–46.0)
Lymphs Abs: 1.8 10*3/uL (ref 0.7–4.0)
MCHC: 34 g/dL (ref 30.0–36.0)
MCV: 90.2 fl (ref 78.0–100.0)
Monocytes Absolute: 0.8 10*3/uL (ref 0.1–1.0)
Monocytes Relative: 12.6 % — ABNORMAL HIGH (ref 3.0–12.0)
Neutro Abs: 3.4 10*3/uL (ref 1.4–7.7)
Neutrophils Relative %: 54.2 % (ref 43.0–77.0)
Platelets: 221 10*3/uL (ref 150.0–400.0)
RBC: 4.89 Mil/uL (ref 4.22–5.81)
RDW: 13.3 % (ref 11.5–15.5)
WBC: 6.2 10*3/uL (ref 4.0–10.5)

## 2022-08-04 LAB — HEPATIC FUNCTION PANEL
ALT: 29 U/L (ref 0–53)
AST: 22 U/L (ref 0–37)
Albumin: 4.5 g/dL (ref 3.5–5.2)
Alkaline Phosphatase: 56 U/L (ref 39–117)
Bilirubin, Direct: 0.1 mg/dL (ref 0.0–0.3)
Total Bilirubin: 0.7 mg/dL (ref 0.2–1.2)
Total Protein: 6.5 g/dL (ref 6.0–8.3)

## 2022-08-04 LAB — BASIC METABOLIC PANEL
BUN: 27 mg/dL — ABNORMAL HIGH (ref 6–23)
CO2: 30 mEq/L (ref 19–32)
Calcium: 9.3 mg/dL (ref 8.4–10.5)
Chloride: 100 mEq/L (ref 96–112)
Creatinine, Ser: 0.97 mg/dL (ref 0.40–1.50)
GFR: 80.95 mL/min (ref >60.00)
Glucose, Bld: 85 mg/dL (ref 70–99)
Potassium: 3.6 mEq/L (ref 3.5–5.1)
Sodium: 139 mEq/L (ref 135–145)

## 2022-08-04 LAB — HEMOGLOBIN A1C: Hgb A1c MFr Bld: 5.3 % (ref 4.6–6.5)

## 2022-08-04 LAB — TSH: TSH: 2.76 u[IU]/mL (ref 0.35–5.50)

## 2022-08-04 LAB — VITAMIN B12: Vitamin B-12: 232 pg/mL (ref 211–911)

## 2022-08-04 LAB — PSA: PSA: 1.39 ng/mL (ref 0.10–4.00)

## 2022-08-10 ENCOUNTER — Encounter: Payer: Self-pay | Admitting: Internal Medicine

## 2022-08-10 ENCOUNTER — Ambulatory Visit (INDEPENDENT_AMBULATORY_CARE_PROVIDER_SITE_OTHER): Payer: BC Managed Care – PPO | Admitting: Internal Medicine

## 2022-08-10 VITALS — BP 130/76 | HR 74 | Temp 97.8°F | Ht 71.0 in | Wt 195.0 lb

## 2022-08-10 DIAGNOSIS — E559 Vitamin D deficiency, unspecified: Secondary | ICD-10-CM

## 2022-08-10 DIAGNOSIS — Z86010 Personal history of colonic polyps: Secondary | ICD-10-CM

## 2022-08-10 DIAGNOSIS — Z125 Encounter for screening for malignant neoplasm of prostate: Secondary | ICD-10-CM

## 2022-08-10 DIAGNOSIS — E538 Deficiency of other specified B group vitamins: Secondary | ICD-10-CM | POA: Insufficient documentation

## 2022-08-10 DIAGNOSIS — E7849 Other hyperlipidemia: Secondary | ICD-10-CM | POA: Diagnosis not present

## 2022-08-10 DIAGNOSIS — Z0001 Encounter for general adult medical examination with abnormal findings: Secondary | ICD-10-CM | POA: Diagnosis not present

## 2022-08-10 DIAGNOSIS — R739 Hyperglycemia, unspecified: Secondary | ICD-10-CM | POA: Diagnosis not present

## 2022-08-10 MED ORDER — ROSUVASTATIN CALCIUM 40 MG PO TABS: 40.0000 mg | ORAL_TABLET | Freq: Every day | ORAL | 3 refills | Status: DC

## 2022-08-10 MED ORDER — OMEPRAZOLE 20 MG PO CPDR: DELAYED_RELEASE_CAPSULE | ORAL | 3 refills | Status: DC

## 2022-08-10 NOTE — Assessment & Plan Note (Signed)
Last vitamin D Lab Results  Component Value Date   VD25OH 48.64 08/04/2022   Stable improved, cont oral replacement

## 2022-08-10 NOTE — Assessment & Plan Note (Signed)
Lab Results  Component Value Date   HGBA1C 5.3 08/04/2022   Stable, pt to continue current medical treatment  - diet, wt control

## 2022-08-10 NOTE — Assessment & Plan Note (Addendum)
Age and sex appropriate education and counseling updated with regular exercise and diet Referrals for preventative services - for colonoscopy as is due in aug 2024 Immunizations addressed - declines covid booster, for tdap at the pharmacy Smoking counseling  - none needed Evidence for depression or other mood disorder - stable depression on wellbutrin per psychiatry, and ativan prn Most recent labs reviewed. I have personally reviewed and have noted: 1) the patient's medical and social history 2) The patient's current medications and supplements 3) The patient's height, weight, and BMI have been recorded in the chart

## 2022-08-10 NOTE — Assessment & Plan Note (Signed)
Lab Results  Component Value Date   OMVEHMCN47 096 08/04/2022   Low , to start oral replacement - b12 1000 mcg qd x 6 mo

## 2022-08-10 NOTE — Patient Instructions (Addendum)
Please have your Tdap tetanus shot at the [facility] to increase the crestor to 40 mg per day  Please take all new medication as recommended - the B12 1000 mcg per day OTC for 6 months  You will be contacted regarding the referral for: colonoscopy  Please continue all other medications as before, and refills have been done if requested.  Please have the pharmacy call with any other refills you may need.  Please continue your efforts at being more active, low cholesterol diet, and weight control.  You are otherwise up to date with prevention measures today.  Please keep your appointments with your specialists as you may have planned  Please make an Appointment to return for your 1 year visit, or sooner if needed, with Lab testing by Appointment as well, to be done about 3-5 days before at the [facility] (so this is for TWO appointments - please see the scheduling desk as you leave)

## 2022-08-10 NOTE — Assessment & Plan Note (Signed)
Lab Results  Component Value Date   LDLCALC 90 08/04/2022   Uncontrolled, goal ldl < 70, pt to increase crestor 40 mg qd

## 2022-08-10 NOTE — Progress Notes (Signed)
Patient ID: [name] [name], male   DOB: 04/25/1956, 67 y.o.   MRN: [id number]         Chief Complaint:: wellness exam and hld, low b12, low vit d, hyperglycemia       HPI:  [name] [name] is a 67 y.o. male here for wellness exam; declines covid booster, for tdap at pharmacy, due for colonoscopy , o/w up to date                        Also Pt denies chest pain, increased sob or doe, wheezing, orthopnea, PND, increased LE swelling, palpitations, dizziness or syncope.   Pt denies polydipsia, polyuria, or new focal neuro s/s.    Pt denies fever, wt loss, night sweats, loss of appetite, or other constitutional symptoms  Good med compliance.  Does not take b12.    Wt Readings from Last 3 Encounters:  08/10/22 195 lb (88.5 kg)  08/16/21 188 lb 12.8 oz (85.6 kg)  08/09/21 186 lb (84.4 kg)   BP Readings from Last 3 Encounters:  08/10/22 130/76  08/16/21 110/70  08/09/21 112/72   Immunization History  Administered Date(s) Administered   Fluad Quad(high Dose 65+) 05/06/2022   Influenza Split 05/07/2012   Influenza Whole 03/27/2008, 06/23/2010   Influenza, High Dose Seasonal PF 07/06/2020   Influenza,inj,Quad PF,6+ Mos 06/19/2014, 04/29/2015, 05/25/2016, 04/13/2019   Influenza-Unspecified 04/09/2017, 04/09/2018, 04/18/2021   PFIZER(Purple Top)SARS-COV-2 Vaccination 11/22/2019   PNEUMOCOCCAL CONJUGATE-20 08/09/2021   Tdap 10/16/2011   Zoster Recombinat (Shingrix) 02/07/2017, 06/14/2017   Health Maintenance Due  Topic Date Due   DTaP/Tdap/Td (2 - Td or Tdap) 10/15/2021      Past Medical History:  Diagnosis Date   Anxiety    GERD (gastroesophageal reflux disease)    History of colon polyps    History of kidney stones    History of skin cancer    squamous   Hyperlipidemia    Kidney stones    RBBB    NOTED IN 2006   Past Surgical History:  Procedure Laterality Date   CARDIOVASCULAR STRESS TEST  09-06-2007  DR NISHAN   NORMAL NUCLEAR STUDY/  NO ISCHEMIA/  EF 53%   COLONOSCOPY   2007  POLYPECTOMY/   2012  NEGATIVE   neg 07/06/2099   CYSTOSCOPY W/ URETERAL STENT REMOVAL Bilateral 07/07/2013   Procedure: CYSTOSCOPY WITH STENT REMOVAL;  Surgeon: Alexis Frock, MD;  Location: [facility];  Service: Urology;  Laterality: Bilateral;   CYSTOSCOPY WITH RETROGRADE PYELOGRAM, URETEROSCOPY AND STENT PLACEMENT Bilateral 06/18/2013   Procedure: CYSTOSCOPY WITH RETROGRADE PYELOGRAM, URETEROSCOPY AND STENT PLACEMENT;  Surgeon: Alexis Frock, MD;  Location: [facility];  Service: Urology;  Laterality: Bilateral;   EXTRACORPOREAL SHOCK WAVE LITHOTRIPSY Left JUNE 2009   HOLMIUM LASER APPLICATION Bilateral 84/66/5993   Procedure: HOLMIUM LASER APPLICATION;  Surgeon: Alexis Frock, MD;  Location: [facility];  Service: Urology;  Laterality: Bilateral;   KNEE ARTHROSCOPY W/ MENISCAL REPAIR Right MAY 2014   LEFT URETEROSCOPIC STONE EXTRACTION  04-04-2008   RETINAL TEAR REPAIR CRYOTHERAPY Left 09/2014   Repaired by laser surgery   SHOULDER SURGERY Right    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 Laurelyn Sickle   TONSILLECTOMY AND ADENOIDECTOMY  AS CHILD   UPPER GASTROINTESTINAL ENDOSCOPY  2011   GERD    reports that he has  never smoked. He has never used smokeless tobacco. He reports current alcohol use of about 14.0 standard drinks of alcohol per week. He reports that he does not use drugs. family history includes Cancer in his mother; Crohn's disease in his cousin; Dementia in his mother; Diabetes in his father; Heart disease in his father, maternal grandfather, and maternal grandmother; Hypertension in his mother; Pancreatic cancer in his paternal aunt. Allergies  Allergen Reactions   Codeine Nausea And Vomiting   Oxycodone Itching   Current Outpatient Medications on File Prior to Visit  Medication Sig Dispense Refill   aspirin 81 MG tablet Take 81 mg by  mouth daily.     buPROPion (WELLBUTRIN XL) 300 MG 24 hr tablet Take 300 mg by mouth daily.     Cholecalciferol (VITAMIN D3) 100000 UNIT/GM POWD by Does not apply route.     fluconazole (DIFLUCAN) 200 MG tablet Take 200 mg by mouth once a week.     indapamide (LOZOL) 2.5 MG tablet Take 2.5 mg by mouth daily.     LORazepam (ATIVAN) 1 MG tablet Take 1 mg by mouth 2 (two) times daily.      sildenafil (VIAGRA) 100 MG tablet Take 0.5-1 tablets (50-100 mg total) by mouth daily as needed for erectile dysfunction. 5 tablet 11   No current facility-administered medications on file prior to visit.        ROS:  All others reviewed and negative.  Objective        PE:  BP 130/76 (BP Location: Right Arm, Patient Position: Sitting, Cuff Size: Large)   Pulse 74   Temp 97.8 F (36.6 C) (Oral)   Ht '5\' 11"'$  (1.803 m)   Wt 195 lb (88.5 kg)   SpO2 95%   BMI 27.20 kg/m                 Constitutional: Pt appears in NAD               HENT: Head: NCAT.                Right Ear: External ear normal.                 Left Ear: External ear normal.                Eyes: . Pupils are equal, round, and reactive to light. Conjunctivae and EOM are normal               Nose: without d/c or deformity               Neck: Neck supple. Gross normal ROM               Cardiovascular: Normal rate and regular rhythm.                 Pulmonary/Chest: Effort normal and breath sounds without rales or wheezing.                Abd:  Soft, NT, ND, + BS, no organomegaly               Neurological: Pt is alert. At baseline orientation, motor grossly intact               Skin: Skin is warm. No rashes, no other new lesions, LE edema - none               Psychiatric: Pt behavior is normal without agitation   Micro: none  Cardiac tracings I have personally interpreted today:  none  Pertinent Radiological findings (summarize): none   Lab Results  Component Value Date   WBC 6.2 08/04/2022   HGB 15.0 08/04/2022   HCT 44.1  08/04/2022   PLT 221.0 08/04/2022   GLUCOSE 85 08/04/2022   CHOL 174 08/04/2022   TRIG 163.0 (H) 08/04/2022   HDL 51.30 08/04/2022   LDLDIRECT 130.7 10/16/2011   LDLCALC 90 08/04/2022   ALT 29 08/04/2022   AST 22 08/04/2022   NA 139 08/04/2022   K 3.6 08/04/2022   CL 100 08/04/2022   CREATININE 0.97 08/04/2022   BUN 27 (H) 08/04/2022   CO2 30 08/04/2022   TSH 2.76 08/04/2022   PSA 1.39 08/04/2022   HGBA1C 5.3 08/04/2022   Assessment/Plan:  [name] [name] is a 67 y.o. White or Caucasian [1] male with  has a past medical history of Anxiety, GERD (gastroesophageal reflux disease), History of colon polyps, History of kidney stones, History of skin cancer, Hyperlipidemia, Kidney stones, and RBBB.  Encounter for well adult exam with abnormal findings Age and sex appropriate education and counseling updated with regular exercise and diet Referrals for preventative services - for colonoscopy as is due in aug 2024 Immunizations addressed - declines covid booster, for tdap at the pharmacy Smoking counseling  - none needed Evidence for depression or other mood disorder - stable depression on wellbutrin per psychiatry, and ativan prn Most recent labs reviewed. I have personally reviewed and have noted: 1) the patient's medical and social history 2) The patient's current medications and supplements 3) The patient's height, weight, and BMI have been recorded in the chart   B12 deficiency Lab Results  Component Value Date   VITAMINB12 232 08/04/2022   Low , to start oral replacement - b12 1000 mcg qd x 6 mo   HLD (hyperlipidemia) Lab Results  Component Value Date   [place] 90 08/04/2022   Uncontrolled, goal ldl < 70, pt to increase crestor 40 mg qd   Hyperglycemia Lab Results  Component Value Date   HGBA1C 5.3 08/04/2022   Stable, pt to continue current medical treatment  - diet, wt control   Vitamin D deficiency Last vitamin D Lab Results  Component Value Date    VD25OH 48.64 08/04/2022   Stable improved, cont oral replacement  Followup: No follow-ups on file.  Cathlean Cower, MD 08/10/2022 11:37 AM [facility]

## 2022-10-03 DIAGNOSIS — F330 Major depressive disorder, recurrent, mild: Secondary | ICD-10-CM | POA: Diagnosis not present

## 2022-10-03 DIAGNOSIS — F411 Generalized anxiety disorder: Secondary | ICD-10-CM | POA: Diagnosis not present

## 2022-10-05 ENCOUNTER — Ambulatory Visit (INDEPENDENT_AMBULATORY_CARE_PROVIDER_SITE_OTHER): Payer: Medicare Other | Admitting: Internal Medicine

## 2022-10-05 VITALS — BP 122/66 | HR 72 | Temp 97.8°F | Ht 71.0 in | Wt 196.0 lb

## 2022-10-05 DIAGNOSIS — R739 Hyperglycemia, unspecified: Secondary | ICD-10-CM

## 2022-10-05 DIAGNOSIS — E7849 Other hyperlipidemia: Secondary | ICD-10-CM | POA: Diagnosis not present

## 2022-10-05 DIAGNOSIS — E559 Vitamin D deficiency, unspecified: Secondary | ICD-10-CM

## 2022-10-05 DIAGNOSIS — E538 Deficiency of other specified B group vitamins: Secondary | ICD-10-CM | POA: Diagnosis not present

## 2022-10-05 DIAGNOSIS — Z125 Encounter for screening for malignant neoplasm of prostate: Secondary | ICD-10-CM

## 2022-10-05 NOTE — Progress Notes (Signed)
Patient ID: [name] [name], male   DOB: 1956-06-16, 67 y.o.   MRN: [id number]        Chief Complaint: follow up HLD and hyperglycemia , low vit d       HPI:  [name] [name] is a 67 y.o. male here overall doing ok,  Pt denies chest pain, increased sob or doe, wheezing, orthopnea, PND, increased LE swelling, palpitations, dizziness or syncope.   Pt denies polydipsia, polyuria, or new focal neuro s/s.    Pt denies fever, wt loss, night sweats, loss of appetite, or other constitutional symptoms  Tolerating statin well.         Wt Readings from Last 3 Encounters:  10/05/22 196 lb (88.9 kg)  08/10/22 195 lb (88.5 kg)  08/16/21 188 lb 12.8 oz (85.6 kg)   BP Readings from Last 3 Encounters:  10/05/22 122/66  08/10/22 130/76  08/16/21 110/70         Past Medical History:  Diagnosis Date   Anxiety    GERD (gastroesophageal reflux disease)    History of colon polyps    History of kidney stones    History of skin cancer    squamous   Hyperlipidemia    Kidney stones    RBBB    NOTED IN 2006   Past Surgical History:  Procedure Laterality Date   CARDIOVASCULAR STRESS TEST  09-06-2007  DR NISHAN   NORMAL NUCLEAR STUDY/  NO ISCHEMIA/  EF 53%   COLONOSCOPY  2007  POLYPECTOMY/   2012  NEGATIVE   neg 07/06/2099   CYSTOSCOPY W/ URETERAL STENT REMOVAL Bilateral 07/07/2013   Procedure: CYSTOSCOPY WITH STENT REMOVAL;  Surgeon: Alexis Frock, MD;  Location: [facility];  Service: Urology;  Laterality: Bilateral;   CYSTOSCOPY WITH RETROGRADE PYELOGRAM, URETEROSCOPY AND STENT PLACEMENT Bilateral 06/18/2013   Procedure: CYSTOSCOPY WITH RETROGRADE PYELOGRAM, URETEROSCOPY AND STENT PLACEMENT;  Surgeon: Alexis Frock, MD;  Location: [facility];  Service: Urology;  Laterality: Bilateral;   EXTRACORPOREAL SHOCK WAVE LITHOTRIPSY Left JUNE 2009   HOLMIUM LASER APPLICATION Bilateral [id number]   Procedure: HOLMIUM LASER APPLICATION;  Surgeon: Alexis Frock, MD;  Location:  [facility];  Service: Urology;  Laterality: Bilateral;   KNEE ARTHROSCOPY W/ MENISCAL REPAIR Right MAY 2014   LEFT URETEROSCOPIC STONE EXTRACTION  04-04-2008   RETINAL TEAR REPAIR CRYOTHERAPY Left 09/2014   Repaired by laser surgery   SHOULDER SURGERY Right    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 Laurelyn Sickle   TONSILLECTOMY AND ADENOIDECTOMY  AS CHILD   UPPER GASTROINTESTINAL ENDOSCOPY  2011   GERD    reports that he has never smoked. He has never used smokeless tobacco. He reports current alcohol use of about 14.0 standard drinks of alcohol per week. He reports that he does not use drugs. family history includes Cancer in his mother; Crohn's disease in his cousin; Dementia in his mother; Diabetes in his father; Heart disease in his father, maternal grandfather, and maternal grandmother; Hypertension in his mother; Pancreatic cancer in his paternal aunt. Allergies  Allergen Reactions   Codeine Nausea And Vomiting   Oxycodone Itching   Current Outpatient Medications on File Prior to Visit  Medication Sig Dispense Refill   aspirin 81 MG tablet Take 81 mg by mouth daily.     buPROPion (WELLBUTRIN XL) 300 MG 24 hr tablet Take 300 mg  by mouth daily.     Cholecalciferol (VITAMIN D3) 100000 UNIT/GM POWD by Does not apply route.     fluconazole (DIFLUCAN) 200 MG tablet Take 200 mg by mouth once a week.     indapamide (LOZOL) 2.5 MG tablet Take 2.5 mg by mouth daily.     LORazepam (ATIVAN) 1 MG tablet Take 1 mg by mouth 2 (two) times daily.      omeprazole (PRILOSEC) 20 MG capsule TAKE 1 CAPSULE BY MOUTH EVERY DAY 90 capsule 3   rosuvastatin (CRESTOR) 40 MG tablet Take 1 tablet (40 mg total) by mouth daily. 90 tablet 3   sildenafil (VIAGRA) 100 MG tablet Take 0.5-1 tablets (50-100 mg total) by mouth daily as needed for erectile dysfunction. 5 tablet 11   No current facility-administered  medications on file prior to visit.        ROS:  All others reviewed and negative.  Objective        PE:  BP 122/66   Pulse 72   Temp 97.8 F (36.6 C) (Oral)   Ht 5\' 11"  (1.803 m)   Wt 196 lb (88.9 kg)   SpO2 94%   BMI 27.34 kg/m                 Constitutional: Pt appears in NAD               HENT: Head: NCAT.                Right Ear: External ear normal.                 Left Ear: External ear normal.                Eyes: . Pupils are equal, round, and reactive to light. Conjunctivae and EOM are normal               Nose: without d/c or deformity               Neck: Neck supple. Gross normal ROM               Cardiovascular: Normal rate and regular rhythm.                 Pulmonary/Chest: Effort normal and breath sounds without rales or wheezing.                Abd:  Soft, NT, ND, + BS, no organomegaly               Neurological: Pt is alert. At baseline orientation, motor grossly intact               Skin: Skin is warm. No rashes, no other new lesions, LE edema - none               Psychiatric: Pt behavior is normal without agitation   Micro: none  Cardiac tracings I have personally interpreted today:  none  Pertinent Radiological findings (summarize): none   Lab Results  Component Value Date   WBC 6.2 08/04/2022   HGB 15.0 08/04/2022   HCT 44.1 08/04/2022   PLT 221.0 08/04/2022   GLUCOSE 85 08/04/2022   CHOL 174 08/04/2022   TRIG 163.0 (H) 08/04/2022   HDL 51.30 08/04/2022   LDLDIRECT 130.7 10/16/2011   LDLCALC 90 08/04/2022   ALT 29 08/04/2022   AST 22 08/04/2022   NA 139 08/04/2022   K 3.6 08/04/2022   CL 100 08/04/2022  CREATININE 0.97 08/04/2022   BUN 27 (H) 08/04/2022   CO2 30 08/04/2022   TSH 2.76 08/04/2022   PSA 1.39 08/04/2022   HGBA1C 5.3 08/04/2022   Assessment/Plan:  [name] [name] is a 67 y.o. White or Caucasian [1] male with  has a past medical history of Anxiety, GERD (gastroesophageal reflux disease), History of colon polyps, History  of kidney stones, History of skin cancer, Hyperlipidemia, Kidney stones, and RBBB.  No problem-specific Assessment & Plan notes found for this encounter.  Followup: No follow-ups on file.  [name] Cower, MD 10/05/2022 1:56 PM [facility]

## 2022-10-05 NOTE — Patient Instructions (Addendum)
Please consider the Tdap tetanus shot at the pharmacy  Please continue all other medications as before, and refills have been done if requested.  Please have the pharmacy call with any other refills you may need.  Please continue your efforts at being more active, low cholesterol diet, and weight control.  Please keep your appointments with your specialists as you may have planned  Please make an Appointment to return for your 1 year visit, or sooner if needed, with Lab testing by Appointment as well, to be done about 3-5 days before at the [place] (so this is for TWO appointments - please see the scheduling desk as you leave)

## 2022-10-07 ENCOUNTER — Encounter: Payer: Self-pay | Admitting: Internal Medicine

## 2022-10-07 NOTE — Assessment & Plan Note (Signed)
Lab Results  Component Value Date   HGBA1C 5.3 08/04/2022   Stable, pt to continue current medical treatment  - diet, wt control  

## 2022-10-07 NOTE — Assessment & Plan Note (Signed)
Last vitamin D Lab Results  Component Value Date   VD25OH 48.64 08/04/2022   Stable, cont oral replacement

## 2022-10-07 NOTE — Assessment & Plan Note (Signed)
Lab Results  Component Value Date   [place] 90 08/04/2022   uncontrolled, pt tolerating increased statin since then, declines further lab today, pt to continue crestor 40 qd

## 2022-10-18 ENCOUNTER — Ambulatory Visit (INDEPENDENT_AMBULATORY_CARE_PROVIDER_SITE_OTHER): Payer: Medicare Other | Admitting: Nurse Practitioner

## 2022-10-18 ENCOUNTER — Encounter: Payer: Self-pay | Admitting: Nurse Practitioner

## 2022-10-18 VITALS — BP 124/84 | HR 54 | Temp 98.4°F | Resp 16 | Ht 71.0 in | Wt 193.2 lb

## 2022-10-18 DIAGNOSIS — J069 Acute upper respiratory infection, unspecified: Secondary | ICD-10-CM | POA: Diagnosis not present

## 2022-10-18 LAB — POC COVID19 BINAXNOW: SARS Coronavirus 2 Ag: NEGATIVE

## 2022-10-18 MED ORDER — FLUTICASONE PROPIONATE 50 MCG/ACT NA SUSP: 2.0000 | Freq: Every day | NASAL | 0 refills | Status: DC

## 2022-10-18 MED ORDER — BENZONATATE 100 MG PO CAPS: 100.0000 mg | ORAL_CAPSULE | Freq: Three times a day (TID) | ORAL | 0 refills | Status: DC | PRN

## 2022-10-18 MED ORDER — OXYMETAZOLINE HCL 0.05 % NA SOLN: 1.0000 | Freq: Two times a day (BID) | NASAL | 0 refills | Status: DC

## 2022-10-18 NOTE — Patient Instructions (Signed)
[facility] Instructions: Flonase and Afrin use: apply 1spray of afrin in each nare, wait , then apply 2sprays of flonase in each nare. Use both nasal spray consecutively x 3days, then flonase only for at least 14days. Encourage adequate oral hydration. Avoid decongestants if you have high blood pressure. Encourage adequate oral hydration. Use over-the-counter  cold" medicine  such as Dayquil/nyquil for cough and congestion.  Use mucinex DM or Robitussin  or delsym for cough.  You can use plain "Tylenol" or "Advil" for fever, chills and achyness. Use cool mist humidifier at bedtime to help with nasal congestion and cough.  Cold/cough medications may have tylenol or ibuprofen or guaifenesin or dextromethophan in them, so be careful not to take beyond the recommended dose for each of these medications.   "Common cold" symptoms are usually triggered by a virus.  The antibiotics are usually not necessary. On average, a" viral cold" illness may take 7-10 days to resolve. Please, make an appointment if you are not better or if you're worse.

## 2022-10-18 NOTE — Progress Notes (Signed)
Acute Office Visit  Subjective:    Patient ID: [name] [name], male    DOB: Oct 23, 1955, 67 y.o.   MRN: [id number]  Chief Complaint  Patient presents with   URI    Pt c/o Cough, congestion, sneezing, pnd, maxillary sinus pain and sensitive to touch top of head.  Started 10/13/22   URI  This is a new problem. The current episode started in the past 7 days. The problem has been unchanged. Maximum temperature: 99. The fever has been present for Less than 1 day. Associated symptoms include congestion, coughing, headaches, joint pain, rhinorrhea and sinus pain. Pertinent negatives include no abdominal pain, chest pain, diarrhea, dysuria, ear pain, joint swelling, nausea, neck pain, plugged ear sensation, rash, sneezing, sore throat, swollen glands, vomiting or wheezing. He has tried decongestant, acetaminophen, antihistamine and increased fluids for the symptoms. The treatment provided mild relief.   Outpatient Medications Prior to Visit  Medication Sig   aspirin 81 MG tablet Take 81 mg by mouth daily.   buPROPion (WELLBUTRIN XL) 300 MG 24 hr tablet Take 300 mg by mouth daily.   Cholecalciferol (VITAMIN D3) 100000 UNIT/GM POWD by Does not apply route.   fluconazole (DIFLUCAN) 200 MG tablet Take 200 mg by mouth once a week.   indapamide (LOZOL) 2.5 MG tablet Take 2.5 mg by mouth daily.   LORazepam (ATIVAN) 1 MG tablet Take 1 mg by mouth 2 (two) times daily.    omeprazole (PRILOSEC) 20 MG capsule TAKE 1 CAPSULE BY MOUTH EVERY DAY   rosuvastatin (CRESTOR) 40 MG tablet Take 1 tablet (40 mg total) by mouth daily.   sildenafil (VIAGRA) 100 MG tablet Take 0.5-1 tablets (50-100 mg total) by mouth daily as needed for erectile dysfunction.   No facility-administered medications prior to visit.   Reviewed past medical and social history.  Review of Systems  HENT:  Positive for congestion, rhinorrhea and sinus pain. Negative for ear pain, sneezing and sore throat.   Respiratory:  Positive for cough.  Negative for wheezing.   Cardiovascular:  Negative for chest pain.  Gastrointestinal:  Negative for abdominal pain, diarrhea, nausea and vomiting.  Genitourinary:  Negative for dysuria.  Musculoskeletal:  Positive for joint pain. Negative for neck pain.  Skin:  Negative for rash.  Neurological:  Positive for headaches.   Per HPI     Objective:    Physical Exam Vitals and nursing note reviewed.  Constitutional:      General: He is not in acute distress. HENT:     Right Ear: Tympanic membrane, ear canal and external ear normal.     Left Ear: Tympanic membrane, ear canal and external ear normal.     Nose: No nasal tenderness, mucosal edema, congestion or rhinorrhea.     Right Nostril: No occlusion.     Left Nostril: No occlusion.     Right Turbinates: Not enlarged, swollen or pale.     Left Turbinates: Not enlarged, swollen or pale.     Right Sinus: No maxillary sinus tenderness or frontal sinus tenderness.     Left Sinus: No maxillary sinus tenderness or frontal sinus tenderness.     Mouth/Throat:     Pharynx: Oropharynx is clear. Uvula midline.     Tonsils: No tonsillar exudate or tonsillar abscesses.  Eyes:     Extraocular Movements: Extraocular movements intact.     Conjunctiva/sclera: Conjunctivae normal.  Cardiovascular:     Rate and Rhythm: Normal rate and regular rhythm.     Pulses:  Normal pulses.     Heart sounds: Normal heart sounds.  Pulmonary:     Effort: Pulmonary effort is normal.     Breath sounds: Normal breath sounds.  Musculoskeletal:     Cervical back: Normal range of motion and neck supple.  Lymphadenopathy:     Cervical: No cervical adenopathy.  Neurological:     Mental Status: He is alert and oriented to person, place, and time.     BP 124/84 (BP Location: Right Arm, Patient Position: Sitting, Cuff Size: Large)   Pulse (!) 54   Temp 98.4 F (36.9 C) (Oral)   Resp 16   Ht 5\' 11"  (1.803 m)   Wt 193 lb 3.2 oz (87.6 kg)   SpO2 98%   BMI 26.95  kg/m    Results for orders placed or performed in visit on 10/18/22  POC COVID-19  Result Value Ref Range   SARS Coronavirus 2 Ag Negative Negative       Assessment & Plan:   Problem List Items Addressed This Visit   None Visit Diagnoses     Viral upper respiratory tract infection    -  Primary   Relevant Medications   fluticasone (FLONASE) 50 MCG/ACT nasal spray   oxymetazoline (AFRIN NASAL SPRAY) 0.05 % nasal spray   benzonatate (TESSALON) 100 MG capsule   Other Relevant Orders   POC COVID-19 (Completed)        Meds ordered this encounter  Medications   fluticasone (FLONASE) 50 MCG/ACT nasal spray    Sig: Place 2 sprays into both nostrils daily.    Dispense:  16 g    Refill:  0    Order Specific Question:   Supervising Provider    Answer:   Nadene Rubins ALFRED [[id number]]   oxymetazoline (AFRIN NASAL SPRAY) 0.05 % nasal spray    Sig: Place 1 spray into both nostrils 2 (two) times daily. Use only for 3days, then stop    Dispense:  30 mL    Refill:  0    Order Specific Question:   Supervising Provider    Answer:   Nadene Rubins ALFRED [[id number]]   benzonatate (TESSALON) 100 MG capsule    Sig: Take 1-2 capsules (100-200 mg total) by mouth 3 (three) times daily as needed.    Dispense:  20 capsule    Refill:  0    Order Specific Question:   Supervising Provider    Answer:   Mliss Sax [[id number]]   Return if symptoms worsen or fail to improve.    Alysia Penna, NP

## 2022-10-20 ENCOUNTER — Other Ambulatory Visit: Payer: Self-pay | Admitting: Nurse Practitioner

## 2022-10-20 DIAGNOSIS — J069 Acute upper respiratory infection, unspecified: Secondary | ICD-10-CM

## 2022-10-24 DIAGNOSIS — Z125 Encounter for screening for malignant neoplasm of prostate: Secondary | ICD-10-CM | POA: Diagnosis not present

## 2022-10-24 DIAGNOSIS — N200 Calculus of kidney: Secondary | ICD-10-CM | POA: Diagnosis not present

## 2022-10-31 DIAGNOSIS — Z125 Encounter for screening for malignant neoplasm of prostate: Secondary | ICD-10-CM | POA: Diagnosis not present

## 2022-10-31 DIAGNOSIS — N200 Calculus of kidney: Secondary | ICD-10-CM | POA: Diagnosis not present

## 2022-10-31 DIAGNOSIS — N5201 Erectile dysfunction due to arterial insufficiency: Secondary | ICD-10-CM | POA: Diagnosis not present

## 2022-11-01 ENCOUNTER — Other Ambulatory Visit: Payer: Self-pay | Admitting: Internal Medicine

## 2022-11-17 ENCOUNTER — Other Ambulatory Visit: Payer: Self-pay

## 2022-12-09 ENCOUNTER — Other Ambulatory Visit: Payer: Self-pay | Admitting: Nurse Practitioner

## 2022-12-09 DIAGNOSIS — J069 Acute upper respiratory infection, unspecified: Secondary | ICD-10-CM

## 2022-12-11 DIAGNOSIS — K080 Exfoliation of teeth due to systemic causes: Secondary | ICD-10-CM | POA: Diagnosis not present

## 2022-12-21 DIAGNOSIS — H52203 Unspecified astigmatism, bilateral: Secondary | ICD-10-CM | POA: Diagnosis not present

## 2023-01-03 ENCOUNTER — Encounter: Payer: Self-pay | Admitting: Internal Medicine

## 2023-01-15 ENCOUNTER — Encounter: Payer: Self-pay | Admitting: Internal Medicine

## 2023-02-04 ENCOUNTER — Other Ambulatory Visit: Payer: Self-pay | Admitting: Internal Medicine

## 2023-02-06 ENCOUNTER — Telehealth: Payer: Self-pay

## 2023-02-06 NOTE — Telephone Encounter (Signed)
Called patient for pre visit appointment at 11am.  No answer, but went to voicemail.  Left message that I would try back in a few minutes to try and reach him

## 2023-02-06 NOTE — Telephone Encounter (Signed)
Called back for pre visit with no answer.  Left another message letting him know that we needed to he needed to call and reschedule a pre visit with one of the nurses by 5pm or his colonoscopy would be cancelled.

## 2023-02-08 NOTE — Telephone Encounter (Signed)
Patient rescheduled on 02/09/23 for another pre visit.

## 2023-02-09 ENCOUNTER — Encounter: Payer: Self-pay | Admitting: Internal Medicine

## 2023-02-09 ENCOUNTER — Ambulatory Visit (AMBULATORY_SURGERY_CENTER): Payer: Medicare Other | Admitting: *Deleted

## 2023-02-09 VITALS — Ht 71.0 in | Wt 188.0 lb

## 2023-02-09 DIAGNOSIS — H524 Presbyopia: Secondary | ICD-10-CM | POA: Diagnosis not present

## 2023-02-09 DIAGNOSIS — Z86010 Personal history of colonic polyps: Secondary | ICD-10-CM

## 2023-02-09 MED ORDER — NA SULFATE-K SULFATE-MG SULF 17.5-3.13-1.6 GM/177ML PO SOLN
1.0000 | Freq: Once | ORAL | 0 refills | Status: AC
Start: 2023-02-09 — End: 2023-02-09

## 2023-02-09 NOTE — Progress Notes (Signed)
Pt's name and DOB verified at the beginning of the pre-visit.  Pt denies any difficulty with ambulating,sitting, laying down or rolling side to side Gave both [facility] main # and MD on call # prior to instructions.  No egg or soy allergy known to patient  No issues known to pt with past sedation with any surgeries or procedures Pt denies having issues being intubated Pt has no issues moving head neck or swallowing No FH of Malignant Hyperthermia Pt is not on diet pills Pt is not on home 02  Pt is not on blood thinners  Pt denies issues with constipation  Pt is not on dialysis Pt denise any abnormal heart rhythms  Pt denies any upcoming cardiac testing Pt encouraged to use to use Singlecare or Goodrx to reduce cost  Patient's chart reviewed by Cathlyn Parsons CNRA prior to pre-visit and patient appropriate for the [facility].  Pre-visit completed and red dot placed by patient's name on their procedure day (on provider's schedule).  . Visit by phone Pt states weight is 188 lb Instructed pt why it is important to and  to call if they have any changes in health or new medications. Directed them to the # given and on instructions.   Pt states they will.  Instructions reviewed with pt and pt states understanding. Instructed to review again prior to procedure. Pt states they will.  Instructions sent by mail with coupon and by my chart

## 2023-02-26 ENCOUNTER — Encounter: Payer: Self-pay | Admitting: Internal Medicine

## 2023-02-26 ENCOUNTER — Ambulatory Visit (AMBULATORY_SURGERY_CENTER): Payer: Medicare Other | Admitting: Internal Medicine

## 2023-02-26 VITALS — BP 122/74 | HR 69 | Temp 98.0°F | Resp 14 | Ht 71.0 in | Wt 188.0 lb

## 2023-02-26 DIAGNOSIS — Z86010 Personal history of colonic polyps: Secondary | ICD-10-CM

## 2023-02-26 DIAGNOSIS — Z09 Encounter for follow-up examination after completed treatment for conditions other than malignant neoplasm: Secondary | ICD-10-CM

## 2023-02-26 DIAGNOSIS — K635 Polyp of colon: Secondary | ICD-10-CM | POA: Diagnosis not present

## 2023-02-26 DIAGNOSIS — D125 Benign neoplasm of sigmoid colon: Secondary | ICD-10-CM | POA: Diagnosis not present

## 2023-02-26 MED ORDER — SODIUM CHLORIDE 0.9 % IV SOLN: 500.0000 mL | Freq: Once | INTRAVENOUS | Status: DC

## 2023-02-26 NOTE — Progress Notes (Signed)
Pt resting comfortably. VSS. Airway intact. SBAR complete to RN. All questions answered.   

## 2023-02-26 NOTE — Patient Instructions (Signed)
-  Handout on polyps and hemorrhoids provided -await pathology results -repeat colonoscopy for surveillance recommended. Date to be determined when pathology result become available   -Continue present medications   YOU HAD AN ENDOSCOPIC PROCEDURE TODAY AT THE [facility]:   Refer to the procedure report that was given to you for any specific questions about what was found during the examination.  If the procedure report does not answer your questions, please call your gastroenterologist to clarify.  If you requested that your care partner not be given the details of your procedure findings, then the procedure report has been included in a sealed envelope for you to review at your convenience later.  YOU SHOULD EXPECT: Some feelings of bloating in the abdomen. Passage of more gas than usual.  Walking can help get rid of the air that was put into your GI tract during the procedure and reduce the bloating. If you had a lower endoscopy (such as a colonoscopy or flexible sigmoidoscopy) you may notice spotting of blood in your stool or on the toilet paper. If you underwent a bowel prep for your procedure, you may not have a normal bowel movement for a few days.  Please Note:  You might notice some irritation and congestion in your nose or some drainage.  This is from the oxygen used during your procedure.  There is no need for concern and it should clear up in a day or so.  SYMPTOMS TO REPORT IMMEDIATELY:  Following lower endoscopy (colonoscopy or flexible sigmoidoscopy):  Excessive amounts of blood in the stool  Significant tenderness or worsening of abdominal pains  Swelling of the abdomen that is new, acute  Fever of 100F or higher   For urgent or emergent issues, a gastroenterologist can be reached at any hour by calling [phone]. Do not use MyChart messaging for urgent concerns.    DIET:  We do recommend a small meal at first, but then you may proceed to your regular diet.   Drink plenty of fluids but you should avoid alcoholic beverages for 24 hours.  ACTIVITY:  You should plan to take it easy for the rest of today and you should NOT DRIVE or use heavy machinery until tomorrow (because of the sedation medicines used during the test).    FOLLOW UP: Our staff will call the number listed on your records the next business day following your procedure.  We will call around 7:15- 8:00 am to check on you and address any questions or concerns that you may have regarding the information given to you following your procedure. If we do not reach you, we will leave a message.     If any biopsies were taken you will be contacted by phone or by letter within the next 1-3 weeks.  Please call us at [phone] if you have not heard about the biopsies in 3 weeks.    SIGNATURES/CONFIDENTIALITY: You and/or your care partner have signed paperwork which will be entered into your electronic medical record.  These signatures attest to the fact that that the information above on your After Visit Summary has been reviewed and is understood.  Full responsibility of the confidentiality of this discharge information lies with you and/or your care-partner.  

## 2023-02-26 NOTE — Progress Notes (Signed)
Called to room to assist during endoscopic procedure.  Patient ID and intended procedure confirmed with present staff. Received instructions for my participation in the procedure from the performing physician.  

## 2023-02-26 NOTE — Progress Notes (Signed)
Pt's states no medical or surgical changes since previsit or office visit. 

## 2023-02-26 NOTE — Progress Notes (Signed)
HISTORY OF PRESENT ILLNESS:  [name] [name] is a 67 y.o. male with a history of sessile serrated polyp and lymphocytic colitis.  Previous colonoscopy 2011 and 2019.  Now for surveillance.  REVIEW OF SYSTEMS:  All non-GI ROS negative except for  Past Medical History:  Diagnosis Date   Anxiety    Arthritis    hx in thumbs currently in shoulders and knees   Cancer (HCC)    Depression    GERD (gastroesophageal reflux disease)    History of colon polyps    History of kidney stones    History of skin cancer    squamous   Hyperlipidemia    Kidney stones    RBBB    NOTED IN 2006    Past Surgical History:  Procedure Laterality Date   CARDIOVASCULAR STRESS TEST  09-06-2007  DR Eden Emms   NORMAL NUCLEAR STUDY/  NO ISCHEMIA/  EF 53%   COLONOSCOPY  2007  POLYPECTOMY/   2012  NEGATIVE   neg 07/06/2099   CYSTOSCOPY W/ URETERAL STENT REMOVAL Bilateral 07/07/2013   Procedure: CYSTOSCOPY WITH STENT REMOVAL;  Surgeon: Sebastian Ache, MD;  Location: [facility];  Service: Urology;  Laterality: Bilateral;   CYSTOSCOPY WITH RETROGRADE PYELOGRAM, URETEROSCOPY AND STENT PLACEMENT Bilateral 06/18/2013   Procedure: CYSTOSCOPY WITH RETROGRADE PYELOGRAM, URETEROSCOPY AND STENT PLACEMENT;  Surgeon: Sebastian Ache, MD;  Location: [facility];  Service: Urology;  Laterality: Bilateral;   EXTRACORPOREAL SHOCK WAVE LITHOTRIPSY Left JUNE 2009   HOLMIUM LASER APPLICATION Bilateral 06/18/2013   Procedure: HOLMIUM LASER APPLICATION;  Surgeon: Sebastian Ache, MD;  Location: [facility];  Service: Urology;  Laterality: Bilateral;   KNEE ARTHROSCOPY W/ MENISCAL REPAIR Right MAY 2014   LEFT URETEROSCOPIC STONE EXTRACTION  04-04-2008   RETINAL TEAR REPAIR CRYOTHERAPY Left 09/2014   Repaired by laser surgery   SHOULDER SURGERY Right    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 Onalee Hua    TONSILLECTOMY AND ADENOIDECTOMY  AS CHILD   UPPER GASTROINTESTINAL ENDOSCOPY  2011   GERD    Social History [name] [name]  reports that he has never smoked. He has never used smokeless tobacco. He reports current alcohol use of about 14.0 standard drinks of alcohol per week. He reports that he does not use drugs.  family history includes Cancer in his mother; Crohn's disease in his cousin; Dementia in his mother; Diabetes in his father; Heart disease in his father, maternal grandfather, and maternal grandmother; Hypertension in his mother; Pancreatic cancer in his paternal aunt.  Allergies  Allergen Reactions   Codeine Nausea And Vomiting   Oxycodone Itching       PHYSICAL EXAMINATION: Vital signs: There were no vitals taken for this visit. General: Well-developed, well-nourished, no acute distress HEENT: Sclerae are anicteric, conjunctiva pink. Oral mucosa intact Lungs: Clear Heart: Regular Abdomen: soft, nontender, nondistended, no obvious ascites, no peritoneal signs, normal bowel sounds. No organomegaly. Extremities: No edema Psychiatric: alert and oriented x3. Cooperative     ASSESSMENT:  History of SSP History lymphocytic colitis   PLAN: Surveillance colonoscopy

## 2023-02-26 NOTE — Op Note (Signed)
[facility] Patient Name: [name] [name] Procedure Date: 02/26/2023 1:23 PM MRN: [id number] Endoscopist: Wilhemina Bonito. Marina Goodell , MD, [id number] Age: 67 Referring MD:  Date of Birth: 22-Oct-1955 Gender: Male Account #: [id number] Procedure:                Colonoscopy with cold snare polypectomy x 1 Indications:              High risk colon cancer surveillance: Personal                            history of sessile serrated colon polyp (less than                            10 mm in size) with no dysplasia. Previous                            examinations 2011, 2019. Also history of                            lymphocytic colitis. Asymptomatic at present Medicines:                Monitored Anesthesia Care Procedure:                Pre-Anesthesia Assessment:                           - Prior to the procedure, a History and Physical                            was performed, and patient medications and                            allergies were reviewed. The patient's tolerance of                            previous anesthesia was also reviewed. The risks                            and benefits of the procedure and the sedation                            options and risks were discussed with the patient.                            All questions were answered, and informed consent                            was obtained. Prior Anticoagulants: The patient has                            taken no anticoagulant or antiplatelet agents. ASA                            Grade Assessment: II - A patient with mild systemic  disease. After reviewing the risks and benefits,                            the patient was deemed in satisfactory condition to                            undergo the procedure.                           After obtaining informed consent, the colonoscope                            was passed under direct vision. Throughout the                             procedure, the patient's blood pressure, pulse, and                            oxygen saturations were monitored continuously. The                            CF [id number] #[id number] was introduced through the anus                            and advanced to the the cecum, identified by                            appendiceal orifice and ileocecal valve. The                            ileocecal valve, appendiceal orifice, and rectum                            were photographed. The quality of the bowel                            preparation was excellent. The colonoscopy was                            performed without difficulty. The patient tolerated                            the procedure well. The bowel preparation used was                            SUPREP via split dose instruction. Scope In: 1:32:06 PM Scope Out: 1:45:26 PM Scope Withdrawal Time: 0 hours 10 minutes 7 seconds  Total Procedure Duration: 0 hours 13 minutes 20 seconds  Findings:                 A 4 mm polyp was found in the sigmoid colon. The                            polyp was removed with a cold snare. Resection  and                            retrieval were complete.                           Internal hemorrhoids were found during                            retroflexion. The hemorrhoids were small.                           The exam was otherwise without abnormality on                            direct and retroflexion views. Complications:            No immediate complications. Estimated blood loss:                            None. Estimated Blood Loss:     Estimated blood loss: none. Impression:               - One 4 mm polyp in the sigmoid colon, removed with                            a cold snare. Resected and retrieved.                           - Internal hemorrhoids.                           - The examination was otherwise normal on direct                            and retroflexion views. Recommendation:            - Repeat colonoscopy in 7-10 years for surveillance.                           - Patient has a contact number available for                            emergencies. The signs and symptoms of potential                            delayed complications were discussed with the                            patient. Return to normal activities tomorrow.                            Written discharge instructions were provided to the                            patient.                           -  Resume previous diet.                           - Continue present medications.                           - Await pathology results. Wilhemina Bonito. Marina Goodell, MD 02/26/2023 1:49:21 PM This report has been signed electronically.

## 2023-02-27 ENCOUNTER — Telehealth: Payer: Self-pay

## 2023-02-27 NOTE — Telephone Encounter (Signed)
  Follow up Call-     02/26/2023    1:03 PM  Call back number  Post procedure Call Back phone  # [phone]  Permission to leave phone message Yes     Patient questions:  Do you have a fever, pain , or abdominal swelling? No. Pain Score  0 *  Have you tolerated food without any problems? Yes.    Have you been able to return to your normal activities? Yes.    Do you have any questions about your discharge instructions: Diet   No. Medications  No. Follow up visit  No.  Do you have questions or concerns about your Care? No.  Actions: * If pain score is 4 or above: No action needed, pain <4.

## 2023-03-01 ENCOUNTER — Encounter: Payer: Self-pay | Admitting: Internal Medicine

## 2023-03-19 ENCOUNTER — Other Ambulatory Visit: Payer: Self-pay | Admitting: Nurse Practitioner

## 2023-03-19 DIAGNOSIS — J069 Acute upper respiratory infection, unspecified: Secondary | ICD-10-CM

## 2023-03-27 DIAGNOSIS — F330 Major depressive disorder, recurrent, mild: Secondary | ICD-10-CM | POA: Diagnosis not present

## 2023-03-27 DIAGNOSIS — F411 Generalized anxiety disorder: Secondary | ICD-10-CM | POA: Diagnosis not present

## 2023-04-03 DIAGNOSIS — S96912A Strain of unspecified muscle and tendon at ankle and foot level, left foot, initial encounter: Secondary | ICD-10-CM | POA: Diagnosis not present

## 2023-04-03 DIAGNOSIS — M79672 Pain in left foot: Secondary | ICD-10-CM | POA: Diagnosis not present

## 2023-05-10 DIAGNOSIS — D225 Melanocytic nevi of trunk: Secondary | ICD-10-CM | POA: Diagnosis not present

## 2023-05-10 DIAGNOSIS — I781 Nevus, non-neoplastic: Secondary | ICD-10-CM | POA: Diagnosis not present

## 2023-05-10 DIAGNOSIS — B351 Tinea unguium: Secondary | ICD-10-CM | POA: Diagnosis not present

## 2023-05-10 DIAGNOSIS — Z85828 Personal history of other malignant neoplasm of skin: Secondary | ICD-10-CM | POA: Diagnosis not present

## 2023-05-10 DIAGNOSIS — L820 Inflamed seborrheic keratosis: Secondary | ICD-10-CM | POA: Diagnosis not present

## 2023-05-20 ENCOUNTER — Other Ambulatory Visit: Payer: Self-pay | Admitting: Internal Medicine

## 2023-06-13 DIAGNOSIS — K080 Exfoliation of teeth due to systemic causes: Secondary | ICD-10-CM | POA: Diagnosis not present

## 2023-06-20 DIAGNOSIS — K080 Exfoliation of teeth due to systemic causes: Secondary | ICD-10-CM | POA: Diagnosis not present

## 2023-08-07 ENCOUNTER — Other Ambulatory Visit: Payer: Self-pay | Admitting: Internal Medicine

## 2023-08-07 ENCOUNTER — Encounter: Payer: Self-pay | Admitting: Internal Medicine

## 2023-08-07 ENCOUNTER — Other Ambulatory Visit: Payer: Self-pay

## 2023-08-09 ENCOUNTER — Other Ambulatory Visit: Payer: Medicare Other

## 2023-08-09 DIAGNOSIS — E538 Deficiency of other specified B group vitamins: Secondary | ICD-10-CM

## 2023-08-09 DIAGNOSIS — Z125 Encounter for screening for malignant neoplasm of prostate: Secondary | ICD-10-CM

## 2023-08-09 DIAGNOSIS — E7849 Other hyperlipidemia: Secondary | ICD-10-CM

## 2023-08-09 DIAGNOSIS — E559 Vitamin D deficiency, unspecified: Secondary | ICD-10-CM | POA: Diagnosis not present

## 2023-08-09 DIAGNOSIS — R739 Hyperglycemia, unspecified: Secondary | ICD-10-CM | POA: Diagnosis not present

## 2023-08-09 LAB — URINALYSIS, ROUTINE W REFLEX MICROSCOPIC
Hgb urine dipstick: NEGATIVE
Ketones, ur: NEGATIVE
Nitrite: NEGATIVE
Specific Gravity, Urine: 1.025 (ref 1.000–1.030)
Total Protein, Urine: NEGATIVE
Urine Glucose: NEGATIVE
Urobilinogen, UA: 1 (ref 0.0–1.0)
pH: 6.5 (ref 5.0–8.0)

## 2023-08-09 LAB — BASIC METABOLIC PANEL
BUN: 24 mg/dL — ABNORMAL HIGH (ref 6–23)
CO2: 30 meq/L (ref 19–32)
Calcium: 9.5 mg/dL (ref 8.4–10.5)
Chloride: 99 meq/L (ref 96–112)
Creatinine, Ser: 0.88 mg/dL (ref 0.40–1.50)
GFR: 88.54 mL/min (ref >60.00)
Glucose, Bld: 87 mg/dL (ref 70–99)
Potassium: 3.8 meq/L (ref 3.5–5.1)
Sodium: 141 meq/L (ref 135–145)

## 2023-08-09 LAB — CBC WITH DIFFERENTIAL/PLATELET
Basophils Absolute: 0.1 10*3/uL (ref 0.0–0.1)
Basophils Relative: 0.4 % (ref 0.0–3.0)
Eosinophils Absolute: 0.2 10*3/uL (ref 0.0–0.7)
Eosinophils Relative: 1.1 % (ref 0.0–5.0)
HCT: 46.1 % (ref 39.0–52.0)
Hemoglobin: 15.2 g/dL (ref 13.0–17.0)
Lymphocytes Relative: 11.3 % — ABNORMAL LOW (ref 12.0–46.0)
Lymphs Abs: 1.8 10*3/uL (ref 0.7–4.0)
MCHC: 33.1 g/dL (ref 30.0–36.0)
MCV: 91.2 fL (ref 78.0–100.0)
Monocytes Absolute: 1.2 10*3/uL — ABNORMAL HIGH (ref 0.1–1.0)
Monocytes Relative: 7.5 % (ref 3.0–12.0)
Neutro Abs: 12.7 10*3/uL — ABNORMAL HIGH (ref 1.4–7.7)
Neutrophils Relative %: 79.7 % — ABNORMAL HIGH (ref 43.0–77.0)
Platelets: 256 10*3/uL (ref 150.0–400.0)
RBC: 5.06 Mil/uL (ref 4.22–5.81)
RDW: 13.1 % (ref 11.5–15.5)
WBC: 15.9 10*3/uL — ABNORMAL HIGH (ref 4.0–10.5)

## 2023-08-09 LAB — HEPATIC FUNCTION PANEL
ALT: 30 U/L (ref 0–53)
AST: 24 U/L (ref 0–37)
Albumin: 4.5 g/dL (ref 3.5–5.2)
Alkaline Phosphatase: 72 U/L (ref 39–117)
Bilirubin, Direct: 0.1 mg/dL (ref 0.0–0.3)
Total Bilirubin: 0.7 mg/dL (ref 0.2–1.2)
Total Protein: 6.7 g/dL (ref 6.0–8.3)

## 2023-08-09 LAB — LIPID PANEL
Cholesterol: 150 mg/dL (ref 0–200)
HDL: 59.7 mg/dL (ref >39.00)
LDL Cholesterol: 72 mg/dL (ref 0–99)
NonHDL: 90.14
Total CHOL/HDL Ratio: 3
Triglycerides: 90 mg/dL (ref 0.0–149.0)
VLDL: 18 mg/dL (ref 0.0–40.0)

## 2023-08-09 LAB — VITAMIN D 25 HYDROXY (VIT D DEFICIENCY, FRACTURES): VITD: 52.84 ng/mL (ref 30.00–100.00)

## 2023-08-09 LAB — TSH: TSH: 1.82 u[IU]/mL (ref 0.35–5.50)

## 2023-08-09 LAB — PSA: PSA: 1.5 ng/mL (ref 0.10–4.00)

## 2023-08-09 LAB — VITAMIN B12: Vitamin B-12: >1537 pg/mL — ABNORMAL HIGH (ref 211–911)

## 2023-08-09 LAB — HEMOGLOBIN A1C: Hgb A1c MFr Bld: 5.9 % (ref 4.6–6.5)

## 2023-08-14 ENCOUNTER — Ambulatory Visit (INDEPENDENT_AMBULATORY_CARE_PROVIDER_SITE_OTHER): Payer: Medicare Other | Admitting: Internal Medicine

## 2023-08-14 ENCOUNTER — Ambulatory Visit (INDEPENDENT_AMBULATORY_CARE_PROVIDER_SITE_OTHER): Payer: Medicare Other

## 2023-08-14 ENCOUNTER — Telehealth: Payer: Self-pay | Admitting: Internal Medicine

## 2023-08-14 VITALS — BP 122/74 | HR 77 | Temp 98.5°F | Ht 71.0 in | Wt 197.0 lb

## 2023-08-14 DIAGNOSIS — R0602 Shortness of breath: Secondary | ICD-10-CM | POA: Diagnosis not present

## 2023-08-14 DIAGNOSIS — Z125 Encounter for screening for malignant neoplasm of prostate: Secondary | ICD-10-CM

## 2023-08-14 DIAGNOSIS — R8290 Unspecified abnormal findings in urine: Secondary | ICD-10-CM

## 2023-08-14 DIAGNOSIS — E538 Deficiency of other specified B group vitamins: Secondary | ICD-10-CM

## 2023-08-14 DIAGNOSIS — Z0001 Encounter for general adult medical examination with abnormal findings: Secondary | ICD-10-CM | POA: Diagnosis not present

## 2023-08-14 DIAGNOSIS — E559 Vitamin D deficiency, unspecified: Secondary | ICD-10-CM

## 2023-08-14 DIAGNOSIS — N400 Enlarged prostate without lower urinary tract symptoms: Secondary | ICD-10-CM | POA: Diagnosis not present

## 2023-08-14 DIAGNOSIS — R0600 Dyspnea, unspecified: Secondary | ICD-10-CM | POA: Diagnosis not present

## 2023-08-14 DIAGNOSIS — R739 Hyperglycemia, unspecified: Secondary | ICD-10-CM

## 2023-08-14 DIAGNOSIS — E7849 Other hyperlipidemia: Secondary | ICD-10-CM | POA: Diagnosis not present

## 2023-08-14 DIAGNOSIS — R079 Chest pain, unspecified: Secondary | ICD-10-CM | POA: Diagnosis not present

## 2023-08-14 MED ORDER — EZETIMIBE 10 MG PO TABS: 10.0000 mg | ORAL_TABLET | Freq: Every day | ORAL | 3 refills | Status: DC

## 2023-08-14 MED ORDER — TAMSULOSIN HCL 0.4 MG PO CAPS: 0.4000 mg | ORAL_CAPSULE | Freq: Every day | ORAL | 3 refills | Status: DC

## 2023-08-14 NOTE — Progress Notes (Addendum)
 Patient ID: [name] [name], male   DOB: 11/17/1955, 68 y.o.   MRN: [id number]         Chief Complaint:: wellness exam and bph, low b12, hld, dyspnea chest pain with hx of elevated Card CT score       HPI:  [name] [name] is a 68 y.o. male here for wellness exam; for tdap at pharmacy, declines covid booster, o/w up to date                        Also Pt has had mild vague chest pain, increased sob or doe, but no wheezing, orthopnea, PND, increased LE swelling, palpitations, dizziness or syncope.   Pt denies polydipsia, polyuria, or new focal neuro s/s.    Pt denies fever, wt loss, night sweats, loss of appetite, or other constitutional symptoms  Has some urinary hesitancy and nocturia.  Is taking B12 1000 mcg per day.  Denies urinary symptoms such as dysuria, frequency, urgency, flank pain, hematuria or n/v, fever, chills.     Wt Readings from Last 3 Encounters:  08/14/23 197 lb (89.4 kg)  02/26/23 188 lb (85.3 kg)  02/09/23 188 lb (85.3 kg)   BP Readings from Last 3 Encounters:  08/14/23 122/74  02/26/23 122/74  10/18/22 124/84   Immunization History  Administered Date(s) Administered   Fluad Quad(high Dose 65+) 05/06/2022, 04/17/2023   Influenza Split 05/07/2012   Influenza Whole 03/27/2008, 06/23/2010   Influenza, High Dose Seasonal PF 07/06/2020   Influenza,inj,Quad PF,6+ Mos 06/19/2014, 04/29/2015, 05/25/2016, 04/13/2019   Influenza-Unspecified 04/09/2017, 04/09/2018, 04/18/2021   PFIZER(Purple Top)SARS-COV-2 Vaccination 11/22/2019   PNEUMOCOCCAL CONJUGATE-20 08/09/2021   Tdap 10/16/2011   Zoster Recombinant(Shingrix) 02/07/2017, 06/14/2017   Health Maintenance Due  Topic Date Due   Medicare Annual Wellness (AWV)  Never done   DTaP/Tdap/Td (2 - Td or Tdap) 10/15/2021      Past Medical History:  Diagnosis Date   Anxiety    Arthritis    hx in thumbs currently in shoulders and knees   Cancer (HCC)    Depression    GERD (gastroesophageal reflux disease)    History  of colon polyps    History of kidney stones    History of skin cancer    squamous   Hyperlipidemia    Kidney stones    RBBB    NOTED IN 2006   Past Surgical History:  Procedure Laterality Date   CARDIOVASCULAR STRESS TEST  09-06-2007  DR NISHAN   NORMAL NUCLEAR STUDY/  NO ISCHEMIA/  EF 53%   COLONOSCOPY  2007  POLYPECTOMY/   2012  NEGATIVE   neg 07/06/2099   CYSTOSCOPY W/ URETERAL STENT REMOVAL Bilateral 07/07/2013   Procedure: CYSTOSCOPY WITH STENT REMOVAL;  Surgeon: Ricardo Likens, MD;  Location: [facility];  Service: Urology;  Laterality: Bilateral;   CYSTOSCOPY WITH RETROGRADE PYELOGRAM, URETEROSCOPY AND STENT PLACEMENT Bilateral 06/18/2013   Procedure: CYSTOSCOPY WITH RETROGRADE PYELOGRAM, URETEROSCOPY AND STENT PLACEMENT;  Surgeon: Ricardo Likens, MD;  Location: [facility];  Service: Urology;  Laterality: Bilateral;   EXTRACORPOREAL SHOCK WAVE LITHOTRIPSY Left JUNE 2009   HOLMIUM LASER APPLICATION Bilateral 06/18/2013   Procedure: HOLMIUM LASER APPLICATION;  Surgeon: Ricardo Likens, MD;  Location: [facility];  Service: Urology;  Laterality: Bilateral;   KNEE ARTHROSCOPY W/ MENISCAL REPAIR Right MAY 2014   LEFT URETEROSCOPIC STONE EXTRACTION  04-04-2008   RETINAL TEAR REPAIR CRYOTHERAPY Left 09/2014   Repaired by laser  surgery   SHOULDER SURGERY Right    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 Zell Mussel   TONSILLECTOMY AND ADENOIDECTOMY  AS CHILD   UPPER GASTROINTESTINAL ENDOSCOPY  2011   GERD    reports that he has never smoked. He has never used smokeless tobacco. He reports current alcohol use of about 14.0 standard drinks of alcohol per week. He reports that he does not use drugs. family history includes Cancer in his mother; Crohn's disease in his cousin; Dementia in his mother; Diabetes in his father; Heart disease in his father, maternal  grandfather, and maternal grandmother; Hypertension in his mother; Pancreatic cancer in his paternal aunt. Allergies  Allergen Reactions   Codeine Nausea And Vomiting   Oxycodone  Itching   Current Outpatient Medications on File Prior to Visit  Medication Sig Dispense Refill   aspirin 81 MG tablet Take 81 mg by mouth daily.     buPROPion (WELLBUTRIN XL) 300 MG 24 hr tablet Take 300 mg by mouth daily.     Cholecalciferol (VITAMIN D3) 100000 UNIT/GM POWD by Does not apply route.     indapamide (LOZOL) 2.5 MG tablet Take 2.5 mg by mouth daily.     LORazepam (ATIVAN) 1 MG tablet Take 1 mg by mouth 2 (two) times daily.     omeprazole  (PRILOSEC) 20 MG capsule TAKE 1 CAPSULE BY MOUTH ONCE DAILY 90 capsule 3   sildenafil  (VIAGRA ) 100 MG tablet Take 0.5-1 tablets (50-100 mg total) by mouth daily as needed for erectile dysfunction. 5 tablet 11   terbinafine (LAMISIL) 250 MG tablet Take 250 mg by mouth daily.     diclofenac (VOLTAREN) 75 MG EC tablet Take 1 tablet by mouth 2 (two) times daily. (Patient not taking: Reported on 08/14/2023)     No current facility-administered medications on file prior to visit.        ROS:  All others reviewed and negative.  Objective        PE:  BP 122/74 (BP Location: Right Arm, Patient Position: Sitting, Cuff Size: Normal)   Pulse 77   Temp 98.5 F (36.9 C) (Oral)   Ht 5' 11 (1.803 m)   Wt 197 lb (89.4 kg)   SpO2 95%   BMI 27.48 kg/m                 Constitutional: Pt appears in NAD               HENT: Head: NCAT.                Right Ear: External ear normal.                 Left Ear: External ear normal.                Eyes: . Pupils are equal, round, and reactive to light. Conjunctivae and EOM are normal               Nose: without d/c or deformity               Neck: Neck supple. Gross normal ROM               Cardiovascular: Normal rate and regular rhythm.                 Pulmonary/Chest: Effort normal and breath sounds without rales or wheezing.  Abd:  Soft, NT, ND, + BS, no organomegaly               Neurological: Pt is alert. At baseline orientation, motor grossly intact               Skin: Skin is warm. No rashes, no other new lesions, LE edema - none               Psychiatric: Pt behavior is normal without agitation   Micro: none  Cardiac tracings I have personally interpreted today:  none  Pertinent Radiological findings (summarize): none   Lab Results  Component Value Date   WBC 15.9 (H) 08/09/2023   HGB 15.2 08/09/2023   HCT 46.1 08/09/2023   PLT 256.0 08/09/2023   GLUCOSE 87 08/09/2023   CHOL 150 08/09/2023   TRIG 90.0 08/09/2023   HDL 59.70 08/09/2023   LDLDIRECT 130.7 10/16/2011   LDLCALC 72 08/09/2023   ALT 30 08/09/2023   AST 24 08/09/2023   NA 141 08/09/2023   K 3.8 08/09/2023   CL 99 08/09/2023   CREATININE 0.88 08/09/2023   BUN 24 (H) 08/09/2023   CO2 30 08/09/2023   TSH 1.82 08/09/2023   PSA 1.50 08/09/2023   HGBA1C 5.9 08/09/2023   Assessment/Plan:  [name] [name] is a 68 y.o. White or Caucasian [1] male with  has a past medical history of Anxiety, Arthritis, Cancer (HCC), Depression, GERD (gastroesophageal reflux disease), History of colon polyps, History of kidney stones, History of skin cancer, Hyperlipidemia, Kidney stones, and RBBB.  Encounter for well adult exam with abnormal findings Age and sex appropriate education and counseling updated with regular exercise and diet Referrals for preventative services - none needed Immunizations addressed - declines covid booster, for tdap at pharmacy Smoking counseling  - none needed Evidence for depression or other mood disorder - none significant Most recent labs reviewed. I have personally reviewed and have noted: 1) the patient's medical and social history 2) The patient's current medications and supplements 3) The patient's height, weight, and BMI have been recorded in the chart   B12 deficiency Lab Results  Component  Value Date   VITAMINB12 >1537 (H) 08/09/2023   Overcontrolled, ok to decrease the b12 to mon - wed- Friday and  cont oral replacement - b12 1000 mcg   Dyspnea Etiology unclear, for cardaic stress test, also cxr   Hyperglycemia Lab Results  Component Value Date   HGBA1C 5.9 08/09/2023   Stable, pt to continue current medical treatment  - diet, wt control  Vitamin D  deficiency Last vitamin D  Lab Results  Component Value Date   VD25OH 52.84 08/09/2023   Stable, cont oral replacement   BPH (benign prostatic hyperplasia) With mild worsening symptoms - for trial flomax  0.4 mg daily  Chest pain Mild intermittent, etiology unclear, or cardiac stress testing  to f/u any worsening symptoms or concerns  HLD (hyperlipidemia) Lab Results  Component Value Date   LDLCALC 72 08/09/2023   uncontrolled, pt to start zetia  10 mg every day, cont crestor  40 qd   Followup: Return in about 1 year (around 08/13/2024).  [name] Rush, MD 08/20/2023 7:08 PM [facility]

## 2023-08-14 NOTE — Patient Instructions (Signed)
 Please take all new medication as prescribed  - the flomax  for prostate to see if this helps enough to continue taking,   Ok to decrease the B12 to mon - wed - fri  Please take all new medication as prescribed - the zetia  10 mg per day  Please continue all other medications as before, and refills have been done if requested.  Please have the pharmacy call with any other refills you may need.  Please continue your efforts at being more active, low cholesterol diet, and weight control.  You are otherwise up to date with prevention measures today.  Please keep your appointments with your specialists as you may have planned - Dr Delford soon  Please go to the XRAY Department in the first floor for the x-ray testing  You will be contacted regarding the referral for: stress test  Please make an Appointment to return for your 1 year visit, or sooner if needed, with Lab testing by Appointment as well, to be done about 3-5 days before at the FIRST FLOOR Lab (so this is for TWO appointments - please see the scheduling desk as you leave)

## 2023-08-14 NOTE — Telephone Encounter (Signed)
 Copied from CRM [id number]. Topic: Clinical - Medical Advice >> Aug 14, 2023  9:44 AM Curlee DEL wrote: Reason for CRM: Patient was seen today by Dr. Norleen - they discussed ordering an urine culture based on his last lab results - the patient left without doing the urine culture - he is not experiencing any fever or discomfort - Does the doctor still want to order that culture and have the patient come back?  I spoke with the CAL and they advised I send a message to the clinical pool.

## 2023-08-14 NOTE — Telephone Encounter (Signed)
Yes he is correct  Sorry for the oversight  Urine culture is ordered, to be done at anytime at his convenience

## 2023-08-15 ENCOUNTER — Encounter (HOSPITAL_COMMUNITY): Payer: Self-pay | Admitting: Internal Medicine

## 2023-08-16 ENCOUNTER — Telehealth (HOSPITAL_COMMUNITY): Payer: Self-pay | Admitting: *Deleted

## 2023-08-16 NOTE — Telephone Encounter (Signed)
 Patient given detailed instructions per Myocardial Perfusion Study Information Sheet for the test on 08/22/2023 at 10:00. Patient notified to arrive 15 minutes early and that it is imperative to arrive on time for appointment to keep from having the test rescheduled.  If you need to cancel or reschedule your appointment, please call the office within 24 hours of your appointment. . Patient verbalized understanding.[name] [name]

## 2023-08-17 NOTE — Progress Notes (Signed)
 CARDIOLOGY CONSULT NOTE       Patient ID: [name] [name] MRN: [id number] DOB/AGE: May 18, 1956 68 y.o.  Referring Physician: Jonny Ruiz Primary Physician: Corwin Levins, MD Primary Cardiologist: New Reason for Consultation: Dyspnea   HPI:  68 y.o. referred by Dr Jonny Ruiz for exertional dyspnea. History of GAD, HLD, chronic RBBB, HTN on Lozol and HLD on zetia and crestor. He is overweight and has gained about 10 lbs over the last 6 months. He does not smoke. He sees Dr Marina Goodell for lymphocytic colitis and polyps Echo done 09/12/18 EF 60-65% no significant valve dx Calcium score March 23/23 95 which was 51 st percentile for age/sex 3 vessel predominantly in LAD  CXR 08/14/23 not read yet. To my review normal   It appears that Dr Jonny Ruiz ordered a Myovue for 08/22/23 reviewed and normal with no ischemia EF 60%   Lab work done 08/14/23 Normal TSH, Hct 46.1, LDL 72  I had seen him back in 2010 for atypical chest pain with normal myovue. Noted chronic RBBB at that time. ? SSCP from GERD improved with prilosec. EGD ok  Has had a trainer 2-3 days/week. Dyspnea and tightness in chest with exertion Lasts minutes No wheezing or LE pain/edema Gravity/stairs makes him particularly winded  ROS All other systems reviewed and negative except as noted above  Past Medical History:  Diagnosis Date   Anxiety    Arthritis    hx in thumbs currently in shoulders and knees   Cancer (HCC)    Depression    GERD (gastroesophageal reflux disease)    History of colon polyps    History of kidney stones    History of skin cancer    squamous   Hyperlipidemia    Kidney stones    RBBB    NOTED IN 2006    Family History  Problem Relation Age of Onset   Hypertension Mother    Cancer Mother        breast & skin   Dementia Mother    Heart disease Father        MI in 40s, CBAG   Diabetes Father    Pancreatic cancer Paternal Aunt    Heart disease Maternal Grandmother        MI in 61s   Heart disease Maternal Grandfather         Mi in 57s   Crohn's disease Cousin        mother side  of the family   Stroke Neg Hx    Colon cancer Neg Hx    Liver cancer Neg Hx    Colon polyps Neg Hx    Rectal cancer Neg Hx    Stomach cancer Neg Hx     Social History   Socioeconomic History   Marital status: Married    Spouse name: Not on file   Number of children: 1   Years of education: Not on file   Highest education level: Master's degree (e.g., MA, MS, MEng, MEd, MSW, MBA)  Occupational History   Not on file  Tobacco Use   Smoking status: Never   Smokeless tobacco: Never  Vaping Use   Vaping status: Never Used  Substance and Sexual Activity   Alcohol use: Yes    Alcohol/week: 14.0 standard drinks of alcohol    Types: 14 Glasses of wine per week    Comment: 2 WINE DAILY   Drug use: No   Sexual activity: Not on file  Other Topics Concern  Not on file  Social History Narrative   Right handed   Lives with with family    [employer]   Social Drivers of Health   Financial Resource Strain: Low Risk  (10/17/2022)   Overall Financial Resource Strain (CARDIA)    Difficulty of Paying Living Expenses: Not hard at all  Food Insecurity: Unknown (10/17/2022)   Hunger Vital Sign    Worried About Running Out of Food in the Last Year: Not on file    Ran Out of Food in the Last Year: Never true  Transportation Needs: No Transportation Needs (10/17/2022)   PRAPARE - Administrator, Civil Service (Medical): No    Lack of Transportation (Non-Medical): No  Physical Activity: Insufficiently Active (10/17/2022)   Exercise Vital Sign    Days of Exercise per Week: 3 days    Minutes of Exercise per Session: 40 min  Stress: No Stress Concern Present (10/17/2022)   [employer] of Occupational Health - Occupational Stress Questionnaire    Feeling of Stress : Only a little  Social Connections: Socially Integrated (10/17/2022)   Social Connection and Isolation Panel [NHANES]    Frequency of Communication with  Friends and Family: Twice a week    Frequency of Social Gatherings with Friends and Family: Once a week    Attends Religious Services: 1 to 4 times per year    Active Member of [employer] or Organizations: Yes    Attends Banker Meetings: 1 to 4 times per year    Marital Status: Married  Catering manager Violence: Not on file    Past Surgical History:  Procedure Laterality Date   CARDIOVASCULAR STRESS TEST  09-06-2007  DR Eden Emms   NORMAL NUCLEAR STUDY/  NO ISCHEMIA/  EF 53%   COLONOSCOPY  2007  POLYPECTOMY/   2012  NEGATIVE   neg 07/06/2099   CYSTOSCOPY W/ URETERAL STENT REMOVAL Bilateral 07/07/2013   Procedure: CYSTOSCOPY WITH STENT REMOVAL;  Surgeon: Sebastian Ache, MD;  Location: [facility];  Service: Urology;  Laterality: Bilateral;   CYSTOSCOPY WITH RETROGRADE PYELOGRAM, URETEROSCOPY AND STENT PLACEMENT Bilateral 06/18/2013   Procedure: CYSTOSCOPY WITH RETROGRADE PYELOGRAM, URETEROSCOPY AND STENT PLACEMENT;  Surgeon: Sebastian Ache, MD;  Location: [facility];  Service: Urology;  Laterality: Bilateral;   EXTRACORPOREAL SHOCK WAVE LITHOTRIPSY Left JUNE 2009   HOLMIUM LASER APPLICATION Bilateral 06/18/2013   Procedure: HOLMIUM LASER APPLICATION;  Surgeon: Sebastian Ache, MD;  Location: [facility];  Service: Urology;  Laterality: Bilateral;   KNEE ARTHROSCOPY W/ MENISCAL REPAIR Right MAY 2014   LEFT URETEROSCOPIC STONE EXTRACTION  04-04-2008   RETINAL TEAR REPAIR CRYOTHERAPY Left 09/2014   Repaired by laser surgery   SHOULDER SURGERY Right    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 Onalee Hua   TONSILLECTOMY AND ADENOIDECTOMY  AS CHILD   UPPER GASTROINTESTINAL ENDOSCOPY  2011   GERD      Current Outpatient Medications:    aspirin 81 MG tablet, Take 81 mg by mouth daily., Disp: , Rfl:    buPROPion (WELLBUTRIN XL) 300 MG 24 hr tablet, Take 300  mg by mouth daily., Disp: , Rfl:    Cholecalciferol (VITAMIN D3) 100000 UNIT/GM POWD, by Does not apply route., Disp: , Rfl:    ezetimibe (ZETIA) 10 MG tablet, Take 1 tablet (10 mg total) by mouth daily., Disp: 90 tablet,  Rfl: 3   indapamide (LOZOL) 2.5 MG tablet, Take 2.5 mg by mouth daily., Disp: , Rfl:    LORazepam (ATIVAN) 1 MG tablet, Take 1 mg by mouth 2 (two) times daily., Disp: , Rfl:    omeprazole (PRILOSEC) 20 MG capsule, TAKE 1 CAPSULE BY MOUTH ONCE DAILY, Disp: 90 capsule, Rfl: 3   rosuvastatin (CRESTOR) 40 MG tablet, TAKE 1 TABLET BY MOUTH ONCE DAILY, Disp: 90 tablet, Rfl: 3   sildenafil (VIAGRA) 100 MG tablet, Take 0.5-1 tablets (50-100 mg total) by mouth daily as needed for erectile dysfunction., Disp: 5 tablet, Rfl: 11   tamsulosin (FLOMAX) 0.4 MG CAPS capsule, Take 1 capsule (0.4 mg total) by mouth daily., Disp: 90 capsule, Rfl: 3   terbinafine (LAMISIL) 250 MG tablet, Take 250 mg by mouth daily., Disp: , Rfl:     Physical Exam: Blood pressure 122/76, pulse 73, height 5\' 11"  (1.803 m), weight 194 lb 6.4 oz (88.2 kg), SpO2 96%.    Affect appropriate Healthy:  appears stated age HEENT: normal Neck supple with no adenopathy JVP normal no bruits no thyromegaly Lungs clear with no wheezing and good diaphragmatic motion Heart:  S1/S2 no murmur, no rub, gallop or click PMI normal Abdomen: benighn, BS positve, no tenderness, no AAA no bruit.  No HSM or HJR Distal pulses intact with no bruits No edema Neuro non-focal Skin warm and dry No muscular weakness   Labs:   Lab Results  Component Value Date   WBC 15.9 (H) 08/09/2023   HGB 15.2 08/09/2023   HCT 46.1 08/09/2023   MCV 91.2 08/09/2023   PLT 256.0 08/09/2023   No results for input(s): "NA", "K", "CL", "CO2", "BUN", "CREATININE", "CALCIUM", "PROT", "BILITOT", "ALKPHOS", "ALT", "AST", "GLUCOSE" in the last 168 hours.  Invalid input(s): "LABALBU" Lab Results  Component Value Date   CKTOTAL 76 05/07/2012   CKMB  3.3 08/05/2010    Lab Results  Component Value Date   CHOL 150 08/09/2023   CHOL 174 08/04/2022   CHOL 177 08/02/2021   Lab Results  Component Value Date   HDL 59.70 08/09/2023   HDL 51.30 08/04/2022   HDL 56.00 08/02/2021   Lab Results  Component Value Date   LDLCALC 72 08/09/2023   LDLCALC 90 08/04/2022   LDLCALC 84 08/02/2021   Lab Results  Component Value Date   TRIG 90.0 08/09/2023   TRIG 163.0 (H) 08/04/2022   TRIG 186.0 (H) 08/02/2021   Lab Results  Component Value Date   CHOLHDL 3 08/09/2023   CHOLHDL 3 08/04/2022   CHOLHDL 3 08/02/2021   Lab Results  Component Value Date   LDLDIRECT 130.7 10/16/2011      Radiology: DG Chest 2 View Result Date: 08/25/2023 CLINICAL DATA:  Shortness of breath with exertion EXAM: CHEST - 2 VIEW COMPARISON:  Chest radiograph 09/11/2018 FINDINGS: The heart size and mediastinal contours are within normal limits. Both lungs are clear. The visualized skeletal structures are unremarkable. IMPRESSION: No active cardiopulmonary disease. Electronically Signed   By: Annia Belt M.D.   On: 08/25/2023 21:24   Myocardial Perfusion Imaging Result Date: 08/22/2023   The study is normal. The study is low risk.   No ST deviation was noted.   LV perfusion is normal. There is no evidence of ischemia. There is no evidence of infarction.   Left ventricular function is normal. Nuclear stress EF: 60%. The left ventricular ejection fraction is normal (55-65%). End diastolic cavity size is normal. End systolic cavity size is  normal.   Prior study not available for comparison. Normal study.  Low risk study. Functional METS: 10.1.  This is a positive prognostic factor.    EKG: SR rate 73 S1Q3T3  chronic RBBB   ASSESSMENT AND PLAN:   Dyspnea:  CXR NAD, check BNP Echo to assess for structural heart dx. Non smoker with normal exam Myovue normal with no ischemia ECG with chronic RBBB but S1Q3T3 ? Seen with PE Check d dimer if elevated order CTA HLD:   continue crestor and zetia LDL at goal HTN:  continue Lozol per primary DASH diet Weight loss GAD: on wellbutrin and ativan GI:  f/u Marina Goodell history of polyps, colitis and GERD Prilosec CAD:  calcium score about average for age 82/23/23 normal myovue 08/22/23   TTE BNP D dimer   F/U 4-6 weeks after tests    Signed: Charlton Haws 08/28/2023, 10:04 AM

## 2023-08-17 NOTE — Telephone Encounter (Signed)
 Called and let Pt know

## 2023-08-19 ENCOUNTER — Other Ambulatory Visit: Payer: Self-pay | Admitting: Internal Medicine

## 2023-08-19 ENCOUNTER — Encounter: Payer: Self-pay | Admitting: Internal Medicine

## 2023-08-19 DIAGNOSIS — N400 Enlarged prostate without lower urinary tract symptoms: Secondary | ICD-10-CM | POA: Insufficient documentation

## 2023-08-19 DIAGNOSIS — R079 Chest pain, unspecified: Secondary | ICD-10-CM | POA: Insufficient documentation

## 2023-08-19 NOTE — Assessment & Plan Note (Signed)
 Lab Results  Component Value Date   HGBA1C 5.9 08/09/2023   Stable, pt to continue current medical treatment  - diet, wt control

## 2023-08-19 NOTE — Assessment & Plan Note (Signed)
 Mild intermittent, etiology unclear, or cardiac stress testing  to f/u any worsening symptoms or concerns

## 2023-08-19 NOTE — Assessment & Plan Note (Signed)
 Last vitamin D  Lab Results  Component Value Date   VD25OH 52.84 08/09/2023   Stable, cont oral replacement

## 2023-08-19 NOTE — Assessment & Plan Note (Addendum)
 Lab Results  Component Value Date   VITAMINB12 >1537 (H) 08/09/2023   Overcontrolled, ok to decrease the b12 to mon - wed- Friday and  cont oral replacement - b12 1000 mcg

## 2023-08-19 NOTE — Assessment & Plan Note (Signed)
 Etiology unclear, for cardaic stress test, also cxr

## 2023-08-19 NOTE — Assessment & Plan Note (Signed)
Age and sex appropriate education and counseling updated with regular exercise and diet Referrals for preventative services - none needed Immunizations addressed - declines covid booster, for tdap at pharmacy Smoking counseling  - none needed Evidence for depression or other mood disorder - none significant Most recent labs reviewed. I have personally reviewed and have noted: 1) the patient's medical and social history 2) The patient's current medications and supplements 3) The patient's height, weight, and BMI have been recorded in the chart  

## 2023-08-19 NOTE — Assessment & Plan Note (Signed)
 Lab Results  Component Value Date   LDLCALC 72 08/09/2023   uncontrolled, pt to start zetia  10 mg every day, cont crestor  40 qd

## 2023-08-19 NOTE — Assessment & Plan Note (Signed)
 With mild worsening symptoms - for trial flomax  0.4 mg daily

## 2023-08-20 ENCOUNTER — Other Ambulatory Visit: Payer: Self-pay

## 2023-08-22 ENCOUNTER — Encounter: Payer: Self-pay | Admitting: Internal Medicine

## 2023-08-22 ENCOUNTER — Ambulatory Visit (HOSPITAL_COMMUNITY): Payer: Medicare Other | Attending: Internal Medicine

## 2023-08-22 DIAGNOSIS — R0600 Dyspnea, unspecified: Secondary | ICD-10-CM | POA: Diagnosis not present

## 2023-08-22 DIAGNOSIS — R079 Chest pain, unspecified: Secondary | ICD-10-CM | POA: Diagnosis not present

## 2023-08-22 LAB — MYOCARDIAL PERFUSION IMAGING
Angina Index: 0
Duke Treadmill Score: 8
Estimated workload: 10.1
Exercise duration (min): 8 min
Exercise duration (sec): 0 s
LV dias vol: 101 mL (ref 62–150)
LV sys vol: 40 mL
MPHR: 152 {beats}/min
Nuc Stress EF: 60 %
Peak HR: 150 {beats}/min
Percent HR: 98 %
Rest HR: 71 {beats}/min
Rest Nuclear Isotope Dose: 9.8 mCi
SDS: 0
SRS: 0
SSS: 0
ST Depression (mm): 0 mm
Stress Nuclear Isotope Dose: 31.8 mCi
TID: 0.88

## 2023-08-22 MED ORDER — TECHNETIUM TC 99M TETROFOSMIN IV KIT
31.8000 | PACK | Freq: Once | INTRAVENOUS | Status: AC | PRN
  Administered 2023-08-22: 31.8 via INTRAVENOUS

## 2023-08-22 MED ORDER — TECHNETIUM TC 99M TETROFOSMIN IV KIT
9.8000 | PACK | Freq: Once | INTRAVENOUS | Status: AC | PRN
Start: 2023-08-22 — End: 2023-08-22
  Administered 2023-08-22: 9.8 via INTRAVENOUS

## 2023-08-25 ENCOUNTER — Encounter: Payer: Self-pay | Admitting: Internal Medicine

## 2023-08-27 ENCOUNTER — Encounter: Payer: Self-pay | Admitting: Internal Medicine

## 2023-08-27 ENCOUNTER — Other Ambulatory Visit (INDEPENDENT_AMBULATORY_CARE_PROVIDER_SITE_OTHER): Payer: Medicare Other

## 2023-08-27 DIAGNOSIS — E7849 Other hyperlipidemia: Secondary | ICD-10-CM | POA: Diagnosis not present

## 2023-08-27 DIAGNOSIS — R300 Dysuria: Secondary | ICD-10-CM

## 2023-08-27 LAB — URINALYSIS, ROUTINE W REFLEX MICROSCOPIC
Bilirubin Urine: NEGATIVE
Hgb urine dipstick: NEGATIVE
Nitrite: NEGATIVE
Specific Gravity, Urine: 1.025 (ref 1.000–1.030)
Total Protein, Urine: NEGATIVE
Urine Glucose: NEGATIVE
Urobilinogen, UA: 0.2 (ref 0.0–1.0)
pH: 6.5 (ref 5.0–8.0)

## 2023-08-28 ENCOUNTER — Ambulatory Visit: Payer: Medicare Other | Attending: Cardiovascular Disease | Admitting: Cardiovascular Disease

## 2023-08-28 ENCOUNTER — Other Ambulatory Visit: Payer: Medicare Other

## 2023-08-28 ENCOUNTER — Encounter: Payer: Self-pay | Admitting: Cardiovascular Disease

## 2023-08-28 VITALS — BP 122/76 | HR 73 | Ht 71.0 in | Wt 194.4 lb

## 2023-08-28 DIAGNOSIS — I4510 Unspecified right bundle-branch block: Secondary | ICD-10-CM | POA: Diagnosis not present

## 2023-08-28 DIAGNOSIS — R079 Chest pain, unspecified: Secondary | ICD-10-CM

## 2023-08-28 DIAGNOSIS — R300 Dysuria: Secondary | ICD-10-CM | POA: Diagnosis not present

## 2023-08-28 DIAGNOSIS — R0600 Dyspnea, unspecified: Secondary | ICD-10-CM | POA: Diagnosis not present

## 2023-08-28 DIAGNOSIS — I10 Essential (primary) hypertension: Secondary | ICD-10-CM

## 2023-08-28 NOTE — Patient Instructions (Addendum)
 Medication Instructions:  Your physician recommends that you continue on your current medications as directed. Please refer to the Current Medication list given to you today.  *If you need a refill on your cardiac medications before your next appointment, please call your pharmacy*  Lab Work: Your physician recommends that you lab work done at [facility] If you have labs (blood work) drawn today and your tests are completely normal, you will receive your results only by: [employer] (if you have MyChart) OR A paper copy in the mail If you have any lab test that is abnormal or we need to change your treatment, we will call you to review the results.  Testing/Procedures: Your physician has requested that you have an echocardiogram. Echocardiography is a painless test that uses sound waves to create images of your heart. It provides your doctor with information about the size and shape of your heart and how well your heart's chambers and valves are working. This procedure takes approximately one hour. There are no restrictions for this procedure. Please do NOT wear cologne, perfume, aftershave, or lotions (deodorant is allowed). Please arrive 15 minutes prior to your appointment time.  Please note: We ask at that you not bring children with you during ultrasound (echo/ vascular) testing. Due to room size and safety concerns, children are not allowed in the ultrasound rooms during exams. Our front office staff cannot provide observation of children in our lobby area while testing is being conducted. An adult accompanying a patient to their appointment will only be allowed in the ultrasound room at the discretion of the ultrasound technician under special circumstances. We apologize for any inconvenience. Follow-Up: At [facility], you and your health needs are our priority.  As part of our continuing mission to provide you with exceptional heart care, we have created  designated Provider Care Teams.  These Care Teams include your primary Cardiologist (physician) and Advanced Practice Providers (APPs -  Physician Assistants and Nurse Practitioners) who all work together to provide you with the care you need, when you need it.  We recommend signing up for the patient portal called "MyChart".  Sign up information is provided on this After Visit Summary.  MyChart is used to connect with patients for Virtual Visits (Telemedicine).  Patients are able to view lab/test results, encounter notes, upcoming appointments, etc.  Non-urgent messages can be sent to your provider as well.   To learn more about what you can do with MyChart, go to [email or web address].    Your next appointment:   4 to 6 weeks  Provider:   Charlton Haws, MD     Other Instructions    1st Floor: - Lobby - Registration  - Pharmacy  - Lab - Cafe  2nd Floor: - PV Lab - Diagnostic Testing (echo, CT, nuclear med)  3rd Floor: - Vacant  4th Floor: - TCTS (cardiothoracic surgery) - AFib Clinic - Structural Heart Clinic - Vascular Surgery  - Vascular Ultrasound  5th Floor: - [facility] (general and EP) - Clinical Pharmacy for coumadin, hypertension, lipid, weight-loss medications, and med management appointments    Valet parking services will be available as well.

## 2023-08-28 NOTE — Addendum Note (Signed)
 Addended by: Marcia Brash on: 08/28/2023 10:59 AM   Modules accepted: Orders

## 2023-08-29 ENCOUNTER — Encounter: Payer: Self-pay | Admitting: Internal Medicine

## 2023-08-29 LAB — URINE CULTURE
MICRO NUMBER:: 16097997
Result:: NO GROWTH
SPECIMEN QUALITY:: ADEQUATE

## 2023-08-29 LAB — PRO B NATRIURETIC PEPTIDE: NT-Pro BNP: 68 pg/mL (ref 0–376)

## 2023-08-29 LAB — D-DIMER, QUANTITATIVE: D-DIMER: 0.29 mg{FEU}/L (ref 0.00–0.49)

## 2023-09-18 ENCOUNTER — Ambulatory Visit (INDEPENDENT_AMBULATORY_CARE_PROVIDER_SITE_OTHER): Payer: Medicare Other

## 2023-09-18 VITALS — Ht 71.0 in | Wt 192.0 lb

## 2023-09-18 DIAGNOSIS — Z0000 Encounter for general adult medical examination without abnormal findings: Secondary | ICD-10-CM

## 2023-09-18 NOTE — Progress Notes (Signed)
 Subjective:   [name] [name] is a 68 y.o. who presents for a Medicare Wellness preventive visit.  Visit Complete: Virtual I connected with  Brita Romp on 09/18/23 by a video and audio enabled telemedicine application and verified that I am speaking with the correct person using two identifiers.  Patient Location: Home  Provider Location: Home Office  I discussed the limitations of evaluation and management by telemedicine. The patient expressed understanding and agreed to proceed.  Vital Signs: Because this visit was a virtual/telehealth visit, some criteria may be missing or patient reported. Any vitals not documented were not able to be obtained and vitals that have been documented are patient reported.   AWV Questionnaire: No: Patient Medicare AWV questionnaire was not completed prior to this visit.  Cardiac Risk Factors include: male gender;Other (see comment);dyslipidemia, Risk factor comments: Coronary artery disease     Objective:    Today's Vitals   09/18/23 1008  Weight: 192 lb (87.1 kg)  Height: 5\' 11"  (1.803 m)   Body mass index is 26.78 kg/m.     09/18/2023   10:14 AM 08/16/2021    8:19 AM 09/14/2016   10:32 AM 07/07/2013    7:32 AM 06/18/2013   12:24 PM  Advanced Directives  Does Patient Have a Medical Advance Directive? No No No Patient does not have advance directive;Patient would not like information Patient does not have advance directive;Patient would not like information  Pre-existing out of facility DNR order (yellow form or pink MOST form)    No No    Current Medications (verified) Outpatient Encounter Medications as of 09/18/2023  Medication Sig   aspirin 81 MG tablet Take 81 mg by mouth daily.   buPROPion (WELLBUTRIN XL) 300 MG 24 hr tablet Take 300 mg by mouth daily.   Cholecalciferol (VITAMIN D3) 100000 UNIT/GM POWD by Does not apply route.   ezetimibe (ZETIA) 10 MG tablet Take 1 tablet (10 mg total) by mouth daily.   indapamide (LOZOL)  2.5 MG tablet Take 2.5 mg by mouth daily.   LORazepam (ATIVAN) 1 MG tablet Take 1 mg by mouth 2 (two) times daily.   omeprazole (PRILOSEC) 20 MG capsule TAKE 1 CAPSULE BY MOUTH ONCE DAILY   rosuvastatin (CRESTOR) 40 MG tablet TAKE 1 TABLET BY MOUTH ONCE DAILY   sildenafil (VIAGRA) 100 MG tablet Take 0.5-1 tablets (50-100 mg total) by mouth daily as needed for erectile dysfunction.   tamsulosin (FLOMAX) 0.4 MG CAPS capsule Take 1 capsule (0.4 mg total) by mouth daily.   terbinafine (LAMISIL) 250 MG tablet Take 250 mg by mouth daily.   No facility-administered encounter medications on file as of 09/18/2023.    Allergies (verified) Codeine and Oxycodone   History: Past Medical History:  Diagnosis Date   Anxiety    Arthritis    hx in thumbs currently in shoulders and knees   Cancer (HCC)    Depression    GERD (gastroesophageal reflux disease)    History of colon polyps    History of kidney stones    History of skin cancer    squamous   Hyperlipidemia    Kidney stones    RBBB    NOTED IN 2006   Past Surgical History:  Procedure Laterality Date   CARDIOVASCULAR STRESS TEST  09-06-2007  DR NISHAN   NORMAL NUCLEAR STUDY/  NO ISCHEMIA/  EF 53%   COLONOSCOPY  2007  POLYPECTOMY/   2012  NEGATIVE   neg 07/06/2099   CYSTOSCOPY  W/ URETERAL STENT REMOVAL Bilateral 07/07/2013   Procedure: CYSTOSCOPY WITH STENT REMOVAL;  Surgeon: Sebastian Ache, MD;  Location: [facility];  Service: Urology;  Laterality: Bilateral;   CYSTOSCOPY WITH RETROGRADE PYELOGRAM, URETEROSCOPY AND STENT PLACEMENT Bilateral 06/18/2013   Procedure: CYSTOSCOPY WITH RETROGRADE PYELOGRAM, URETEROSCOPY AND STENT PLACEMENT;  Surgeon: Sebastian Ache, MD;  Location: [facility];  Service: Urology;  Laterality: Bilateral;   EXTRACORPOREAL SHOCK WAVE LITHOTRIPSY Left JUNE 2009   HOLMIUM LASER APPLICATION Bilateral 06/18/2013   Procedure: HOLMIUM LASER APPLICATION;  Surgeon: Sebastian Ache, MD;   Location: [facility];  Service: Urology;  Laterality: Bilateral;   KNEE ARTHROSCOPY W/ MENISCAL REPAIR Right MAY 2014   LEFT URETEROSCOPIC STONE EXTRACTION  04-04-2008   RETINAL TEAR REPAIR CRYOTHERAPY Left 09/2014   Repaired by laser surgery   SHOULDER SURGERY Right    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 Onalee Hua   TONSILLECTOMY AND ADENOIDECTOMY  AS CHILD   UPPER GASTROINTESTINAL ENDOSCOPY  2011   GERD   Family History  Problem Relation Age of Onset   Hypertension Mother    Cancer Mother        breast & skin   Dementia Mother    Heart disease Father        MI in 60s, CBAG   Diabetes Father    Pancreatic cancer Paternal Aunt    Heart disease Maternal Grandmother        MI in 76s   Heart disease Maternal Grandfather        Mi in 21s   Crohn's disease Cousin        mother side  of the family   Stroke Neg Hx    Colon cancer Neg Hx    Liver cancer Neg Hx    Colon polyps Neg Hx    Rectal cancer Neg Hx    Stomach cancer Neg Hx    Social History   Socioeconomic History   Marital status: Married    Spouse name: Elease Hashimoto   Number of children: 1   Years of education: Not on file   Highest education level: Master's degree (e.g., MA, MS, MEng, MEd, MSW, MBA)  Occupational History   Occupation: Retired  Tobacco Use   Smoking status: Never   Smokeless tobacco: Never  Vaping Use   Vaping status: Never Used  Substance and Sexual Activity   Alcohol use: Yes    Alcohol/week: 14.0 standard drinks of alcohol    Types: 14 Glasses of wine per week    Comment: 2 WINE DAILY   Drug use: No   Sexual activity: Not on file  Other Topics Concern   Not on file  Social History Narrative   Right handed   Lives with with wife and one cat/2025   [employer]   Social Drivers of Health   Financial Resource Strain: Low Risk  (09/18/2023)   Overall Financial Resource Strain  (CARDIA)    Difficulty of Paying Living Expenses: Not hard at all  Food Insecurity: No Food Insecurity (09/18/2023)   Hunger Vital Sign    Worried About Running Out of Food in the Last Year: Never true    Ran Out of Food in the Last Year: Never true  Transportation Needs: No Transportation Needs (09/18/2023)   PRAPARE - Administrator, Civil Service (Medical): No  Lack of Transportation (Non-Medical): No  Physical Activity: Insufficiently Active (09/18/2023)   Exercise Vital Sign    Days of Exercise per Week: 3 days    Minutes of Exercise per Session: 30 min  Stress: No Stress Concern Present (09/18/2023)   [employer] of Occupational Health - Occupational Stress Questionnaire    Feeling of Stress : Not at all  Social Connections: Socially Integrated (09/18/2023)   Social Connection and Isolation Panel [NHANES]    Frequency of Communication with Friends and Family: Three times a week    Frequency of Social Gatherings with Friends and Family: Once a week    Attends Religious Services: More than 4 times per year    Active Member of [employer] or Organizations: Yes    Attends Banker Meetings: 1 to 4 times per year    Marital Status: Married    Tobacco Counseling Counseling given: Not Answered    Clinical Intake:  Pre-visit preparation completed: Yes  Pain : No/denies pain     BMI - recorded: 26.78 Nutritional Status: BMI 25 -29 Overweight Nutritional Risks: None  How often do you need to have someone help you when you read instructions, pamphlets, or other written materials from your doctor or pharmacy?: 1 - Never  Interpreter Needed?: No  Information entered by :: Issiac Jamar, RMA   Activities of Daily Living     09/18/2023   10:09 AM  In your present state of health, do you have any difficulty performing the following activities:  Hearing? 0  Vision? 0  Difficulty concentrating or making decisions? 0  Walking or climbing stairs? 0   Dressing or bathing? 0  Doing errands, shopping? 0  Preparing Food and eating ? N  Using the Toilet? N  In the past six months, have you accidently leaked urine? N  Do you have problems with loss of bowel control? N  Managing your Medications? N  Managing your Finances? N  Housekeeping or managing your Housekeeping? N    Patient Care Team: Corwin Levins, MD as PCP - General (Internal Medicine) Wendall Stade, MD as PCP - Cardiology (Cardiology) Maris Berger, MD as Consulting Physician (Ophthalmology)  Indicate any recent Medical Services you may have received from other than [facility] providers in the past year (date may be approximate).     Assessment:   This is a routine wellness examination for [place].  Hearing/Vision screen Hearing Screening - Comments:: Denies hearing difficulties   Vision Screening - Comments:: Wears eyeglasses   Goals Addressed             This Visit's Progress    <enter goal here>   On track    Maintain healthy life       Depression Screen     09/18/2023   10:17 AM 08/14/2023    8:21 AM 08/10/2022   10:15 AM 08/09/2021    2:34 PM 08/09/2021    2:09 PM 08/06/2020    9:48 AM 08/06/2020    8:59 AM  PHQ 2/9 Scores  PHQ - 2 Score 0 0 0 0 0 0 0  PHQ- 9 Score 0  0        Fall Risk     09/18/2023   10:15 AM 08/14/2023    8:28 AM 08/10/2022   10:15 AM 08/16/2021    8:19 AM 08/09/2021    2:34 PM  Fall Risk   Falls in the past year? 0 0 0 0 0  Number falls in  past yr: 0 0 0 0 0  Injury with Fall? 0 0 0 0 0  Risk for fall due to : No Fall Risks No Fall Risks No Fall Risks    Follow up Falls prevention discussed;Falls evaluation completed Falls evaluation completed Falls evaluation completed      MEDICARE RISK AT HOME:  Medicare Risk at Home If so, are there any without handrails?: Yes Home free of loose throw rugs in walkways, pet beds, electrical cords, etc?: Yes Adequate lighting in your home to reduce risk of falls?: Yes Life alert?:  No Use of a cane, walker or w/c?: No Grab bars in the bathroom?: No Shower chair or bench in shower?: Yes Elevated toilet seat or a handicapped toilet?: Yes  TIMED UP AND GO:  Was the test performed?  No  Cognitive Function: 6CIT completed        Immunizations Immunization History  Administered Date(s) Administered   Fluad Quad(high Dose 65+) 05/06/2022, 04/17/2023   Influenza Split 05/07/2012   Influenza Whole 03/27/2008, 06/23/2010   Influenza, High Dose Seasonal PF 07/06/2020   Influenza,inj,Quad PF,6+ Mos 06/19/2014, 04/29/2015, 05/25/2016, 04/13/2019   Influenza-Unspecified 04/09/2017, 04/09/2018, 04/18/2021   PFIZER(Purple Top)SARS-COV-2 Vaccination 11/22/2019   PNEUMOCOCCAL CONJUGATE-20 08/09/2021   Tdap 10/16/2011   Zoster Recombinant(Shingrix) 02/07/2017, 06/14/2017    Screening Tests Health Maintenance  Topic Date Due   Medicare Annual Wellness (AWV)  Never done   DTaP/Tdap/Td (2 - Td or Tdap) 10/15/2021   COVID-19 Vaccine (2 - 2024-25 season) 03/11/2023   Colonoscopy  02/25/2033   Pneumonia Vaccine 63+ Years old  Completed   INFLUENZA VACCINE  Completed   Hepatitis C Screening  Completed   Zoster Vaccines- Shingrix  Completed   HPV VACCINES  Aged Out    Health Maintenance  Health Maintenance Due  Topic Date Due   Medicare Annual Wellness (AWV)  Never done   DTaP/Tdap/Td (2 - Td or Tdap) 10/15/2021   COVID-19 Vaccine (2 - 2024-25 season) 03/11/2023   Health Maintenance Items Addressed: Tdap due.  Additional Screening:  Vision Screening: Recommended annual ophthalmology exams for early detection of glaucoma and other disorders of the eye.  Dental Screening: Recommended annual dental exams for proper oral hygiene  Community Resource Referral / Chronic Care Management: CRR required this visit?  No   CCM required this visit?  No     Plan:     I have personally reviewed and noted the following in the patient's chart:   Medical and social  history Use of alcohol, tobacco or illicit drugs  Current medications and supplements including opioid prescriptions. Patient is not currently taking opioid prescriptions. Functional ability and status Nutritional status Physical activity Advanced directives List of other physicians Hospitalizations, surgeries, and ER visits in previous 12 months Vitals Screenings to include cognitive, depression, and falls Referrals and appointments  In addition, I have reviewed and discussed with patient certain preventive protocols, quality metrics, and best practice recommendations. A written personalized care plan for preventive services as well as general preventive health recommendations were provided to patient.     Damarco Keysor L Mariaha Ellington, CMA   09/18/2023   After Visit Summary: (MyChart) Due to this being a telephonic visit, the after visit summary with patients personalized plan was offered to patient via MyChart   Notes: Nothing significant to report at this time.

## 2023-09-18 NOTE — Patient Instructions (Addendum)
 [name] , Thank you for taking time to come for your Medicare Wellness Visit. I appreciate your ongoing commitment to your health goals. Please review the following plan we discussed and let me know if I can assist you in the future.   Referrals/Orders/Follow-Ups/Clinician Recommendations: It was nice talking with you today.  You are due for a Tetanus vaccine.  Keep up the good work.  This is a list of the screening recommended for you and due dates:  Health Maintenance  Topic Date Due   Medicare Annual Wellness Visit  Never done   DTaP/Tdap/Td vaccine (2 - Td or Tdap) 10/15/2021   COVID-19 Vaccine (2 - 2024-25 season) 03/11/2023   Colon Cancer Screening  02/25/2033   Pneumonia Vaccine  Completed   Flu Shot  Completed   Hepatitis C Screening  Completed   Zoster (Shingles) Vaccine  Completed   HPV Vaccine  Aged Out    Advanced directives: (Declined) Advance directive discussed with you today. Even though you declined this today, please call our office should you change your mind, and we can give you the proper paperwork for you to fill out.  Next Medicare Annual Wellness Visit scheduled for next year: Yes

## 2023-09-19 DIAGNOSIS — F411 Generalized anxiety disorder: Secondary | ICD-10-CM | POA: Diagnosis not present

## 2023-09-19 DIAGNOSIS — F330 Major depressive disorder, recurrent, mild: Secondary | ICD-10-CM | POA: Diagnosis not present

## 2023-09-20 ENCOUNTER — Encounter: Payer: Self-pay | Admitting: Internal Medicine

## 2023-09-21 ENCOUNTER — Ambulatory Visit (HOSPITAL_COMMUNITY): Payer: Medicare Other | Attending: Cardiovascular Disease

## 2023-09-21 DIAGNOSIS — I4510 Unspecified right bundle-branch block: Secondary | ICD-10-CM

## 2023-09-21 DIAGNOSIS — R0600 Dyspnea, unspecified: Secondary | ICD-10-CM | POA: Diagnosis not present

## 2023-09-21 LAB — ECHOCARDIOGRAM COMPLETE
Area-P 1/2: 2.22 cm2
Est EF: 55
S' Lateral: 3.4 cm

## 2023-09-28 NOTE — Progress Notes (Signed)
 CARDIOLOGY CONSULT NOTE       Patient ID: [name] [name] MRN: [id number] DOB/AGE: 11-07-1955 68 y.o.  Referring Physician: Jonny Ruiz Primary Physician: Corwin Levins, MD Primary Cardiologist: Eden Emms Reason for Consultation: Dyspnea   HPI:  68 y.o. referred by Dr Jonny Ruiz for exertional dyspnea. First seen by me 08/28/23 History of GAD, HLD, chronic RBBB, HTN on Lozol and HLD on zetia and crestor. He is overweight and has gained about 10 lbs over the last 6 months. He does not smoke. He sees Dr Marina Goodell for lymphocytic colitis and polyps Echo done 09/12/18 EF 60-65% no significant valve dx Calcium score March 23/23 95 which was 51 st percentile for age/sex 3 vessel predominantly in LAD  CXR 08/14/23 not read yet. To my review normal   It appears that Dr Jonny Ruiz ordered a Myovue for 08/22/23 reviewed and normal with no ischemia EF 60%   TTE done 09/21/23 reviewed EF 55% mild MR   Lab work done 08/14/23 Normal TSH, Hct 46.1, LDL 72  I had seen him back in 2010 for atypical chest pain with normal myovue. Noted chronic RBBB at that time. ? SSCP from GERD improved with prilosec. EGD ok  Has had a trainer 2-3 days/week. Dyspnea and tightness in chest with exertion Lasts minutes No wheezing or LE pain/edema Gravity/stairs makes him particularly winded  His cardiac testing has been normal.   ROS All other systems reviewed and negative except as noted above  Past Medical History:  Diagnosis Date   Anxiety    Arthritis    hx in thumbs currently in shoulders and knees   Cancer (HCC)    Depression    GERD (gastroesophageal reflux disease)    History of colon polyps    History of kidney stones    History of skin cancer    squamous   Hyperlipidemia    Kidney stones    RBBB    NOTED IN 2006    Family History  Problem Relation Age of Onset   Hypertension Mother    Cancer Mother        breast & skin   Dementia Mother    Heart disease Father        MI in 10s, CBAG   Diabetes Father    Pancreatic  cancer Paternal Aunt    Heart disease Maternal Grandmother        MI in 27s   Heart disease Maternal Grandfather        Mi in 46s   Crohn's disease Cousin        mother side  of the family   Stroke Neg Hx    Colon cancer Neg Hx    Liver cancer Neg Hx    Colon polyps Neg Hx    Rectal cancer Neg Hx    Stomach cancer Neg Hx     Social History   Socioeconomic History   Marital status: Married    Spouse name: Elease Hashimoto   Number of children: 1   Years of education: Not on file   Highest education level: Master's degree (e.g., MA, MS, MEng, MEd, MSW, MBA)  Occupational History   Occupation: Retired  Tobacco Use   Smoking status: Never   Smokeless tobacco: Never  Vaping Use   Vaping status: Never Used  Substance and Sexual Activity   Alcohol use: Yes    Alcohol/week: 14.0 standard drinks of alcohol    Types: 14 Glasses of wine per week    Comment: 2  WINE DAILY   Drug use: No   Sexual activity: Not on file  Other Topics Concern   Not on file  Social History Narrative   Right handed   Lives with with wife and one cat/2025   [employer]   Social Drivers of Health   Financial Resource Strain: Low Risk  (09/18/2023)   Overall Financial Resource Strain (CARDIA)    Difficulty of Paying Living Expenses: Not hard at all  Food Insecurity: No Food Insecurity (09/18/2023)   Hunger Vital Sign    Worried About Running Out of Food in the Last Year: Never true    Ran Out of Food in the Last Year: Never true  Transportation Needs: No Transportation Needs (09/18/2023)   PRAPARE - Administrator, Civil Service (Medical): No    Lack of Transportation (Non-Medical): No  Physical Activity: Insufficiently Active (09/18/2023)   Exercise Vital Sign    Days of Exercise per Week: 3 days    Minutes of Exercise per Session: 30 min  Stress: No Stress Concern Present (09/18/2023)   [employer] of Occupational Health - Occupational Stress Questionnaire    Feeling of Stress :  Not at all  Social Connections: Socially Integrated (09/18/2023)   Social Connection and Isolation Panel [NHANES]    Frequency of Communication with Friends and Family: Three times a week    Frequency of Social Gatherings with Friends and Family: Once a week    Attends Religious Services: More than 4 times per year    Active Member of Clubs or Organizations: Yes    Attends Banker Meetings: 1 to 4 times per year    Marital Status: Married  Catering manager Violence: Not At Risk (09/18/2023)   Humiliation, Afraid, Rape, and Kick questionnaire    Fear of Current or Ex-Partner: No    Emotionally Abused: No    Physically Abused: No    Sexually Abused: No    Past Surgical History:  Procedure Laterality Date   CARDIOVASCULAR STRESS TEST  09-06-2007  DR Eden Emms   NORMAL NUCLEAR STUDY/  NO ISCHEMIA/  EF 53%   COLONOSCOPY  2007  POLYPECTOMY/   2012  NEGATIVE   neg 07/06/2099   CYSTOSCOPY W/ URETERAL STENT REMOVAL Bilateral 07/07/2013   Procedure: CYSTOSCOPY WITH STENT REMOVAL;  Surgeon: Sebastian Ache, MD;  Location: [facility];  Service: Urology;  Laterality: Bilateral;   CYSTOSCOPY WITH RETROGRADE PYELOGRAM, URETEROSCOPY AND STENT PLACEMENT Bilateral 06/18/2013   Procedure: CYSTOSCOPY WITH RETROGRADE PYELOGRAM, URETEROSCOPY AND STENT PLACEMENT;  Surgeon: Sebastian Ache, MD;  Location: [facility];  Service: Urology;  Laterality: Bilateral;   EXTRACORPOREAL SHOCK WAVE LITHOTRIPSY Left JUNE 2009   HOLMIUM LASER APPLICATION Bilateral 06/18/2013   Procedure: HOLMIUM LASER APPLICATION;  Surgeon: Sebastian Ache, MD;  Location: [facility];  Service: Urology;  Laterality: Bilateral;   KNEE ARTHROSCOPY W/ MENISCAL REPAIR Right MAY 2014   LEFT URETEROSCOPIC STONE EXTRACTION  04-04-2008   RETINAL TEAR REPAIR CRYOTHERAPY Left 09/2014   Repaired by laser surgery   SHOULDER SURGERY Right    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 Onalee Hua   TONSILLECTOMY AND ADENOIDECTOMY  AS CHILD   UPPER GASTROINTESTINAL ENDOSCOPY  2011   GERD      Current Outpatient Medications:    aspirin 81 MG tablet, Take 81 mg by mouth daily.,  Disp: , Rfl:    buPROPion (WELLBUTRIN XL) 300 MG 24 hr tablet, Take 300 mg by mouth daily., Disp: , Rfl:    Cholecalciferol (VITAMIN D3) 100000 UNIT/GM POWD, by Does not apply route., Disp: , Rfl:    ezetimibe (ZETIA) 10 MG tablet, Take 1 tablet (10 mg total) by mouth daily., Disp: 90 tablet, Rfl: 3   indapamide (LOZOL) 2.5 MG tablet, Take 2.5 mg by mouth daily., Disp: , Rfl:    LORazepam (ATIVAN) 1 MG tablet, Take 1 mg by mouth 2 (two) times daily., Disp: , Rfl:    omeprazole (PRILOSEC) 20 MG capsule, TAKE 1 CAPSULE BY MOUTH ONCE DAILY, Disp: 90 capsule, Rfl: 3   rosuvastatin (CRESTOR) 40 MG tablet, TAKE 1 TABLET BY MOUTH ONCE DAILY, Disp: 90 tablet, Rfl: 3   sildenafil (VIAGRA) 100 MG tablet, Take 0.5-1 tablets (50-100 mg total) by mouth daily as needed for erectile dysfunction., Disp: 5 tablet, Rfl: 11   tamsulosin (FLOMAX) 0.4 MG CAPS capsule, Take 1 capsule (0.4 mg total) by mouth daily., Disp: 90 capsule, Rfl: 3   terbinafine (LAMISIL) 250 MG tablet, Take 250 mg by mouth daily., Disp: , Rfl:     Physical Exam: Blood pressure 100/66, pulse 78, height 5\' 11"  (1.803 m), weight 190 lb (86.2 kg), SpO2 97%.    Affect appropriate Healthy:  appears stated age HEENT: normal Neck supple with no adenopathy JVP normal no bruits no thyromegaly Lungs clear with no wheezing and good diaphragmatic motion Heart:  S1/S2 no murmur, no rub, gallop or click PMI normal Abdomen: benighn, BS positve, no tenderness, no AAA no bruit.  No HSM or HJR Distal pulses intact with no bruits No edema Neuro non-focal Skin warm and dry No muscular weakness   Labs:   Lab Results  Component Value Date   WBC 15.9 (H) 08/09/2023   HGB 15.2  08/09/2023   HCT 46.1 08/09/2023   MCV 91.2 08/09/2023   PLT 256.0 08/09/2023   No results for input(s): "NA", "K", "CL", "CO2", "BUN", "CREATININE", "CALCIUM", "PROT", "BILITOT", "ALKPHOS", "ALT", "AST", "GLUCOSE" in the last 168 hours.  Invalid input(s): "LABALBU" Lab Results  Component Value Date   CKTOTAL 76 05/07/2012   CKMB 3.3 08/05/2010    Lab Results  Component Value Date   CHOL 150 08/09/2023   CHOL 174 08/04/2022   CHOL 177 08/02/2021   Lab Results  Component Value Date   HDL 59.70 08/09/2023   HDL 51.30 08/04/2022   HDL 56.00 08/02/2021   Lab Results  Component Value Date   LDLCALC 72 08/09/2023   LDLCALC 90 08/04/2022   LDLCALC 84 08/02/2021   Lab Results  Component Value Date   TRIG 90.0 08/09/2023   TRIG 163.0 (H) 08/04/2022   TRIG 186.0 (H) 08/02/2021   Lab Results  Component Value Date   CHOLHDL 3 08/09/2023   CHOLHDL 3 08/04/2022   CHOLHDL 3 08/02/2021   Lab Results  Component Value Date   LDLDIRECT 130.7 10/16/2011      Radiology: ECHOCARDIOGRAM COMPLETE Result Date: 09/21/2023    ECHOCARDIOGRAM REPORT   Patient Name:   [name] [name] Date of Exam: 09/21/2023 Medical Rec #:  [id number]        Height:       71.0 in Accession #:    [id number]       Weight:       192.0 lb Date of Birth:  [birth year]       BSA:  2.072 m Patient Age:    67 years         BP:           122/76 mmHg Patient Gender: M                HR:           71 bpm. Exam Location:  [address] Procedure: 2D Echo, Cardiac Doppler, Color Doppler, 3D Echo and Strain Analysis            (Both Spectral and Color Flow Doppler were utilized during            procedure). Indications:    R06.00 SOB  History:        Patient has prior history of Echocardiogram examinations, most                 recent 09/12/2018. Arrythmias:RBBB, Signs/Symptoms:Shortness of                 Breath and Chest Pain; Risk Factors:Dyslipidemia and                 Hypertension.  Sonographer:    Samule Ohm  RDCS Referring Phys: [id number]ade IMPRESSIONS  1. Left ventricular ejection fraction, by estimation, is 55%. The left ventricle has normal function. The left ventricle has no regional wall motion abnormalities. There is mild left ventricular hypertrophy. Left ventricular diastolic parameters were normal. The average left ventricular global longitudinal strain is -14.4 %. The global longitudinal strain is normal.  2. Right ventricular systolic function is normal. The right ventricular size is normal.  3. Right atrial size was mildly dilated.  4. The mitral valve is abnormal. Mild mitral valve regurgitation. No evidence of mitral stenosis.  5. The aortic valve is tricuspid. Aortic valve regurgitation is not visualized. No aortic stenosis is present.  6. The inferior vena cava is normal in size with greater than 50% respiratory variability, suggesting right atrial pressure of 3 mmHg. FINDINGS  Left Ventricle: Left ventricular ejection fraction, by estimation, is 55%. The left ventricle has normal function. The left ventricle has no regional wall motion abnormalities. The average left ventricular global longitudinal strain is -14.4 %. Strain was performed and the global longitudinal strain is normal. The left ventricular internal cavity size was normal in size. There is mild left ventricular hypertrophy. Left ventricular diastolic parameters were normal. Right Ventricle: The right ventricular size is normal. No increase in right ventricular wall thickness. Right ventricular systolic function is normal. Left Atrium: Left atrial size was normal in size. Right Atrium: Right atrial size was mildly dilated. Pericardium: There is no evidence of pericardial effusion. Mitral Valve: The mitral valve is abnormal. There is mild thickening of the mitral valve leaflet(s). Mild mitral valve regurgitation. No evidence of mitral valve stenosis. Tricuspid Valve: The tricuspid valve is normal in structure. Tricuspid valve  regurgitation is trivial. No evidence of tricuspid stenosis. Aortic Valve: The aortic valve is tricuspid. Aortic valve regurgitation is not visualized. No aortic stenosis is present. Pulmonic Valve: The pulmonic valve was normal in structure. Pulmonic valve regurgitation is mild. No evidence of pulmonic stenosis. Aorta: The aortic root is normal in size and structure. Venous: The inferior vena cava is normal in size with greater than 50% respiratory variability, suggesting right atrial pressure of 3 mmHg. IAS/Shunts: No atrial level shunt detected by color flow Doppler. Additional Comments: 3D was performed not requiring image post processing on an independent workstation and was normal.  LEFT VENTRICLE PLAX 2D LVIDd:         5.00 cm   Diastology LVIDs:         3.40 cm   LV e' medial:    8.38 cm/s LV PW:         1.10 cm   LV E/e' medial:  7.3 LV IVS:        1.30 cm   LV e' lateral:   17.00 cm/s LVOT diam:     2.20 cm   LV E/e' lateral: 3.6 LV SV:         72 LV SV Index:   35        2D Longitudinal Strain LVOT Area:     3.80 cm  2D Strain GLS (A4C):   -14.5 %                          2D Strain GLS (A3C):   -14.8 %                          2D Strain GLS (A2C):   -13.8 %                          2D Strain GLS Avg:     -14.4 %                           3D Volume EF:                          3D EF:        55 %                          LV EDV:       125 ml                          LV ESV:       56 ml                          LV SV:        69 ml RIGHT VENTRICLE             IVC RV S prime:     15.60 cm/s  IVC diam: 1.40 cm TAPSE (M-mode): 2.0 cm RVSP:           22.0 mmHg LEFT ATRIUM             Index        RIGHT ATRIUM           Index LA diam:        3.50 cm 1.69 cm/m   RA Pressure: 3.00 mmHg LA Vol (A2C):   45.1 ml 21.76 ml/m  RA Area:     19.60 cm LA Vol (A4C):   39.5 ml 19.06 ml/m  RA Volume:   53.90 ml  26.01 ml/m LA Biplane Vol: 43.0 ml 20.75 ml/m  AORTIC VALVE LVOT Vmax:   108.00 cm/s LVOT Vmean:  66.000 cm/s  LVOT VTI:    0.189 m  AORTA Ao Root diam: 3.70 cm Ao Asc diam:  3.60 cm MITRAL VALVE  TRICUSPID VALVE MV Area (PHT): 2.22 cm    TR Peak grad:   19.0 mmHg MV Decel Time: 341 msec    TR Vmax:        218.00 cm/s MV E velocity: 60.80 cm/s  Estimated RAP:  3.00 mmHg MV A velocity: 73.90 cm/s  RVSP:           22.0 mmHg MV E/A ratio:  0.82                            SHUNTS                            Systemic VTI:  0.19 m                            Systemic Diam: 2.20 cm Charlton Haws MD Electronically signed by Charlton Haws MD Signature Date/Time: 09/21/2023/10:51:48 AM    Final     EKG: SR rate 73 S1Q3T3  chronic RBBB   ASSESSMENT AND PLAN:   Dyspnea:  CXR NAD, BNP normal 68 08/28/23 Echo 09/21/23 normal EF 55% low normal strain and mild MR  Non smoker with normal exam Myovue normal with no ischemia ECG with chronic RBBB but S1Q3T3 ? Seen with PE but D dimer normal Observe for now  HLD:  continue crestor and zetia LDL at goal HTN:  continue Lozol per primary DASH diet Weight loss GAD: on wellbutrin and ativan GI:  f/u Marina Goodell history of polyps, colitis and GERD Prilosec CAD:  calcium score about average for age 69/23/23 normal myovue 08/22/23   With normal lung exam will leave it up to his primary Dr Jonny Ruiz as to any further pulmonary testing   F/U 6 months   Signed: Charlton Haws 10/09/2023, 9:34 AM

## 2023-10-09 ENCOUNTER — Ambulatory Visit: Payer: Medicare Other | Attending: Cardiovascular Disease | Admitting: Cardiovascular Disease

## 2023-10-09 ENCOUNTER — Encounter: Payer: Self-pay | Admitting: Cardiovascular Disease

## 2023-10-09 VITALS — BP 100/66 | HR 78 | Ht 71.0 in | Wt 190.0 lb

## 2023-10-09 DIAGNOSIS — I10 Essential (primary) hypertension: Secondary | ICD-10-CM | POA: Diagnosis not present

## 2023-10-09 DIAGNOSIS — I4510 Unspecified right bundle-branch block: Secondary | ICD-10-CM

## 2023-10-09 DIAGNOSIS — R0600 Dyspnea, unspecified: Secondary | ICD-10-CM | POA: Diagnosis not present

## 2023-10-09 DIAGNOSIS — R079 Chest pain, unspecified: Secondary | ICD-10-CM | POA: Diagnosis not present

## 2023-10-09 NOTE — Patient Instructions (Addendum)
 Medication Instructions:  Your physician recommends that you continue on your current medications as directed. Please refer to the Current Medication list given to you today.  *If you need a refill on your cardiac medications before your next appointment, please call your pharmacy*  Lab Work: If you have labs (blood work) drawn today and your tests are completely normal, you will receive your results only by: MyChart Message (if you have MyChart) OR A paper copy in the mail If you have any lab test that is abnormal or we need to change your treatment, we will call you to review the results.  Testing/Procedures: None ordered today.  Follow-Up: At [facility], you and your health needs are our priority.  As part of our continuing mission to provide you with exceptional heart care, our providers are all part of one team.  This team includes your primary Cardiologist (physician) and Advanced Practice Providers or APPs (Physician Assistants and Nurse Practitioners) who all work together to provide you with the care you need, when you need it.  Your next appointment:   12 month(s)  Provider:   Charlton Haws, MD    We recommend signing up for the patient portal called "MyChart".  Sign up information is provided on this After Visit Summary.  MyChart is used to connect with patients for Virtual Visits (Telemedicine).  Patients are able to view lab/test results, encounter notes, upcoming appointments, etc.  Non-urgent messages can be sent to your provider as well.   To learn more about what you can do with MyChart, go to [email or web address].   Other Instructions       1st Floor: - Lobby - Registration  - Pharmacy  - Lab - Cafe  2nd Floor: - PV Lab - Diagnostic Testing (echo, CT, nuclear med)  3rd Floor: - Vacant  4th Floor: - TCTS (cardiothoracic surgery) - AFib Clinic - Structural Heart Clinic - Vascular Surgery  - Vascular Ultrasound  5th Floor: - HeartCare  Cardiology (general and EP) - Clinical Pharmacy for coumadin, hypertension, lipid, weight-loss medications, and med management appointments    Valet parking services will be available as well.

## 2023-10-24 DIAGNOSIS — Z125 Encounter for screening for malignant neoplasm of prostate: Secondary | ICD-10-CM | POA: Diagnosis not present

## 2023-10-30 DIAGNOSIS — Z125 Encounter for screening for malignant neoplasm of prostate: Secondary | ICD-10-CM | POA: Diagnosis not present

## 2023-10-30 DIAGNOSIS — L309 Dermatitis, unspecified: Secondary | ICD-10-CM | POA: Diagnosis not present

## 2023-10-30 DIAGNOSIS — L508 Other urticaria: Secondary | ICD-10-CM | POA: Diagnosis not present

## 2023-11-06 DIAGNOSIS — Z125 Encounter for screening for malignant neoplasm of prostate: Secondary | ICD-10-CM | POA: Diagnosis not present

## 2023-11-06 DIAGNOSIS — N5201 Erectile dysfunction due to arterial insufficiency: Secondary | ICD-10-CM | POA: Diagnosis not present

## 2023-11-06 DIAGNOSIS — N200 Calculus of kidney: Secondary | ICD-10-CM | POA: Diagnosis not present

## 2023-12-18 DIAGNOSIS — K080 Exfoliation of teeth due to systemic causes: Secondary | ICD-10-CM | POA: Diagnosis not present

## 2023-12-25 DIAGNOSIS — S46012A Strain of muscle(s) and tendon(s) of the rotator cuff of left shoulder, initial encounter: Secondary | ICD-10-CM | POA: Diagnosis not present

## 2024-01-02 DIAGNOSIS — M25512 Pain in left shoulder: Secondary | ICD-10-CM | POA: Diagnosis not present

## 2024-01-15 DIAGNOSIS — M25512 Pain in left shoulder: Secondary | ICD-10-CM | POA: Diagnosis not present

## 2024-02-13 ENCOUNTER — Telehealth: Payer: Self-pay

## 2024-02-13 NOTE — Telephone Encounter (Signed)
   Pre-operative Risk Assessment    Patient Name: [name] [name]  DOB: [birth year] MRN: [id number]   Date of last office visit: 10/09/23 MAUDE EMMER, MD Date of next office visit: NONE   Request for Surgical Clearance    Procedure:  LT SHOULDER SCOPE ROTATOR CUFF REPAIR  Date of Surgery:  Clearance 04/28/24                                Surgeon:  BONNER HAIR, MD Surgeon's Group or Practice Name:  [facility] Phone number:  [phone]  EXT [phone] Fax number:  [phone]   ATTN: MAEOLA DIVERS   Type of Clearance Requested:   - Medical  - Pharmacy:  Hold Aspirin     Type of Anesthesia:  General    Additional requests/questions:    SignedLucie DELENA Ku   02/13/2024, 3:12 PM

## 2024-02-13 NOTE — Telephone Encounter (Signed)
 S/W pt and at this time pt unable to schedule TELE Preop appt. Pt was driving, stated that he will call back to schedule that appt tomorrow 02/14/24. Told pt that will be fine just ask for the preop team to get scheduled.

## 2024-02-13 NOTE — Telephone Encounter (Signed)
   Name: [name] [name]  DOB: 23-Jul-1955  MRN: [id number]  Primary Cardiologist: Maude Emmer, MD   Preoperative team, please contact this patient and set up a phone call appointment for further preoperative risk assessment. Please obtain consent and complete medication review. Last seen by Dr. Emmer on 10/09/2023. Thank you for your help.  I confirm that guidance regarding antiplatelet and oral anticoagulation therapy has been completed and, if necessary, noted below.  Per office protocol, if patient is without any new symptoms or concerns at the time of their virtual visit, he may hold ASA for 7 days prior to procedure. Please resume ASA as soon as possible postprocedure, at the discretion of the surgeon.    I also confirmed the patient resides in the state of [place] . As per [facility] Medical Board telemedicine laws, the patient must reside in the state in which the provider is licensed.   Lamarr Satterfield, NP 02/13/2024, 3:24 PM [facility]

## 2024-02-15 NOTE — Telephone Encounter (Signed)
 Called patient, NA, LMAM to contact our office to schedule Telehealth appointment.

## 2024-02-19 ENCOUNTER — Telehealth: Payer: Self-pay

## 2024-02-19 NOTE — Telephone Encounter (Signed)
  Patient Consent for Virtual Visit        [name] [name] has provided verbal consent on 02/19/2024 for a virtual visit (video or telephone).   CONSENT FOR VIRTUAL VISIT FOR:  [name] [name]  By participating in this virtual visit I agree to the following:  I hereby voluntarily request, consent and authorize [facility] and its employed or contracted physicians, physician assistants, nurse practitioners or other licensed health care professionals (the Practitioner), to provide me with telemedicine health care services (the "Services) as deemed necessary by the treating Practitioner. I acknowledge and consent to receive the Services by the Practitioner via telemedicine. I understand that the telemedicine visit will involve communicating with the Practitioner through live audiovisual communication technology and the disclosure of certain medical information by electronic transmission. I acknowledge that I have been given the opportunity to request an in-person assessment or other available alternative prior to the telemedicine visit and am voluntarily participating in the telemedicine visit.  I understand that I have the right to withhold or withdraw my consent to the use of telemedicine in the course of my care at any time, without affecting my right to future care or treatment, and that the Practitioner or I may terminate the telemedicine visit at any time. I understand that I have the right to inspect all information obtained and/or recorded in the course of the telemedicine visit and may receive copies of available information for a reasonable fee.  I understand that some of the potential risks of receiving the Services via telemedicine include:  Delay or interruption in medical evaluation due to technological equipment failure or disruption; Information transmitted may not be sufficient (e.g. poor resolution of images) to allow for appropriate medical decision making by the  Practitioner; and/or  In rare instances, security protocols could fail, causing a breach of personal health information.  Furthermore, I acknowledge that it is my responsibility to provide information about my medical history, conditions and care that is complete and accurate to the best of my ability. I acknowledge that Practitioner's advice, recommendations, and/or decision may be based on factors not within their control, such as incomplete or inaccurate data provided by me or distortions of diagnostic images or specimens that may result from electronic transmissions. I understand that the practice of medicine is not an exact science and that Practitioner makes no warranties or guarantees regarding treatment outcomes. I acknowledge that a copy of this consent can be made available to me via my patient portal [facility] MyChart), or I can request a printed copy by calling the office of [facility].    I understand that my insurance will be billed for this visit.   I have read or had this consent read to me. I understand the contents of this consent, which adequately explains the benefits and risks of the Services being provided via telemedicine.  I have been provided ample opportunity to ask questions regarding this consent and the Services and have had my questions answered to my satisfaction. I give my informed consent for the services to be provided through the use of telemedicine in my medical care

## 2024-02-19 NOTE — Telephone Encounter (Signed)
 Pt scheduled for VV on 04/14/24.

## 2024-03-05 DIAGNOSIS — F411 Generalized anxiety disorder: Secondary | ICD-10-CM | POA: Diagnosis not present

## 2024-03-05 DIAGNOSIS — F330 Major depressive disorder, recurrent, mild: Secondary | ICD-10-CM | POA: Diagnosis not present

## 2024-04-11 ENCOUNTER — Telehealth: Payer: Self-pay | Admitting: Cardiovascular Disease

## 2024-04-11 NOTE — Telephone Encounter (Signed)
 Will need to have surgeon resend clearance request please

## 2024-04-11 NOTE — Telephone Encounter (Signed)
 Patient cancelled 10/06 pre-op appointment. He says he plans to postpone his procedure until February.

## 2024-04-14 ENCOUNTER — Ambulatory Visit

## 2024-05-08 DIAGNOSIS — L820 Inflamed seborrheic keratosis: Secondary | ICD-10-CM | POA: Diagnosis not present

## 2024-05-08 DIAGNOSIS — Z08 Encounter for follow-up examination after completed treatment for malignant neoplasm: Secondary | ICD-10-CM | POA: Diagnosis not present

## 2024-05-08 DIAGNOSIS — L578 Other skin changes due to chronic exposure to nonionizing radiation: Secondary | ICD-10-CM | POA: Diagnosis not present

## 2024-05-08 DIAGNOSIS — L814 Other melanin hyperpigmentation: Secondary | ICD-10-CM | POA: Diagnosis not present

## 2024-05-08 DIAGNOSIS — Z85828 Personal history of other malignant neoplasm of skin: Secondary | ICD-10-CM | POA: Diagnosis not present

## 2024-05-27 ENCOUNTER — Encounter: Payer: Self-pay | Admitting: Family Medicine

## 2024-05-27 ENCOUNTER — Ambulatory Visit: Admitting: Family Medicine

## 2024-05-27 ENCOUNTER — Ambulatory Visit: Payer: Self-pay

## 2024-05-27 VITALS — BP 122/60 | HR 55 | Temp 98.6°F | Wt 185.3 lb

## 2024-05-27 DIAGNOSIS — U071 COVID-19: Secondary | ICD-10-CM

## 2024-05-27 MED ORDER — NIRMATRELVIR/RITONAVIR (PAXLOVID)TABLET: 3.0000 | ORAL_TABLET | Freq: Two times a day (BID) | ORAL | 0 refills | Status: AC

## 2024-05-27 NOTE — Progress Notes (Signed)
 Established Patient Office Visit  Subjective   Patient ID: [name] [name], male    DOB: 14-Feb-1956  Age: 68 y.o. MRN: [id number]  Chief Complaint  Patient presents with   Cough   Sore Throat        Headache   Generalized Body Aches    HPI   [name] [name] is seen today as a work in with positive COVID test by home test.  Started yesterday with some headache, sore throat, nasal congestion, body ache.  Relatively mild cough.  Denies any nausea, vomiting, or diarrhea.  No known sick contacts.  He states this be the second time he had COVID.  He does not have any chronic lung or heart problems.  Normal renal function with last GFR 88  Past Medical History:  Diagnosis Date   Anxiety    Arthritis    hx in thumbs currently in shoulders and knees   Cancer (HCC)    Depression    GERD (gastroesophageal reflux disease)    History of colon polyps    History of kidney stones    History of skin cancer    squamous   Hyperlipidemia    Kidney stones    RBBB    NOTED IN 2006   Past Surgical History:  Procedure Laterality Date   CARDIOVASCULAR STRESS TEST  09-06-2007  DR NISHAN   NORMAL NUCLEAR STUDY/  NO ISCHEMIA/  EF 53%   COLONOSCOPY  2007  POLYPECTOMY/   2012  NEGATIVE   neg 07/06/2099   CYSTOSCOPY W/ URETERAL STENT REMOVAL Bilateral 07/07/2013   Procedure: CYSTOSCOPY WITH STENT REMOVAL;  Surgeon: Ricardo Likens, MD;  Location: [facility];  Service: Urology;  Laterality: Bilateral;   CYSTOSCOPY WITH RETROGRADE PYELOGRAM, URETEROSCOPY AND STENT PLACEMENT Bilateral 06/18/2013   Procedure: CYSTOSCOPY WITH RETROGRADE PYELOGRAM, URETEROSCOPY AND STENT PLACEMENT;  Surgeon: Ricardo Likens, MD;  Location: [facility];  Service: Urology;  Laterality: Bilateral;   EXTRACORPOREAL SHOCK WAVE LITHOTRIPSY Left JUNE 2009   HOLMIUM LASER APPLICATION Bilateral 06/18/2013   Procedure: HOLMIUM LASER APPLICATION;  Surgeon: Ricardo Likens, MD;  Location: [facility];  Service: Urology;  Laterality: Bilateral;   KNEE ARTHROSCOPY W/ MENISCAL REPAIR Right MAY 2014   LEFT URETEROSCOPIC STONE EXTRACTION  04-04-2008   RETINAL TEAR REPAIR CRYOTHERAPY Left 09/2014   Repaired by laser surgery   SHOULDER SURGERY Right    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 Zell Mussel   TONSILLECTOMY AND ADENOIDECTOMY  AS CHILD   UPPER GASTROINTESTINAL ENDOSCOPY  2011   GERD    reports that he has never smoked. He has never used smokeless tobacco. He reports current alcohol use of about 14.0 standard drinks of alcohol per week. He reports that he does not use drugs. family history includes Cancer in his mother; Crohn's disease in his cousin; Dementia in his mother; Diabetes in his father; Heart disease in his father, maternal grandfather, and maternal grandmother; Hypertension in his mother; Pancreatic cancer in his paternal aunt. Allergies  Allergen Reactions   Codeine Nausea And Vomiting   Oxycodone  Itching    Review of Systems  Constitutional:  Positive for malaise/fatigue.  HENT:  Positive for congestion and sore throat.   Respiratory:  Positive for cough.   Gastrointestinal:  Negative for diarrhea, nausea and vomiting.  Musculoskeletal:  Positive for myalgias.  Skin:  Negative for  rash.      Objective:     BP 122/60   Pulse (!) 55   Temp 98.6 F (37 C) (Oral)   Wt 185 lb 4.8 oz (84.1 kg)   SpO2 99%   BMI 25.84 kg/m  BP Readings from Last 3 Encounters:  05/27/24 122/60  10/09/23 100/66  08/28/23 122/76   Wt Readings from Last 3 Encounters:  05/27/24 185 lb 4.8 oz (84.1 kg)  10/09/23 190 lb (86.2 kg)  09/18/23 192 lb (87.1 kg)      Physical Exam Vitals reviewed.  Constitutional:      General: He is not in acute distress.    Appearance: He is not ill-appearing.  HENT:     Right Ear: Tympanic membrane normal.     Left Ear: Tympanic membrane  normal.  Cardiovascular:     Rate and Rhythm: Normal rate and regular rhythm.  Pulmonary:     Effort: Pulmonary effort is normal.     Breath sounds: Normal breath sounds. No wheezing or rales.  Musculoskeletal:     Cervical back: Neck supple.  Neurological:     Mental Status: He is alert.      No results found for any visits on 05/27/24.  Last CBC Lab Results  Component Value Date   WBC 15.9 (H) 08/09/2023   HGB 15.2 08/09/2023   HCT 46.1 08/09/2023   MCV 91.2 08/09/2023   MCH 30.8 10/05/2012   RDW 13.1 08/09/2023   PLT 256.0 08/09/2023   Last metabolic panel Lab Results  Component Value Date   GLUCOSE 87 08/09/2023   NA 141 08/09/2023   K 3.8 08/09/2023   CL 99 08/09/2023   CO2 30 08/09/2023   BUN 24 (H) 08/09/2023   CREATININE 0.88 08/09/2023   GFR 88.54 08/09/2023   CALCIUM  9.5 08/09/2023   PROT 6.7 08/09/2023   ALBUMIN 4.5 08/09/2023   BILITOT 0.7 08/09/2023   ALKPHOS 72 08/09/2023   AST 24 08/09/2023   ALT 30 08/09/2023      The 10-year ASCVD risk score (Arnett DK, et al., 2019) is: 14.3%    Assessment & Plan:     COVID-19 infection by home test.  Patient nontoxic in appearance.  No major underlying comorbidities.  O2 sats 99% room air.  Nonfocal lung exam.  Discussed option of antiviral therapy and he would like to proceed.  He does take Crestor  and we recommend he hold the Crestor  or at least half dose of Crestor  during time on Paxlovid.  Watch for any increased myalgias.  Plenty fluids and rest.  Follow-up for any persistent or worsening symptoms.  Wolm Scarlet, MD

## 2024-05-27 NOTE — Telephone Encounter (Signed)
 FYI Only or Action Required?: FYI only for provider: appointment scheduled on 05/27/24.  Patient was last seen in primary care on 08/14/2023 by Norleen Lynwood ORN, MD.  Called Nurse Triage reporting Sore Throat.  Symptoms began yesterday.  Interventions attempted: Nothing.  Symptoms are: unchanged.  Triage Disposition: See Physician Within 24 Hours  Patient/caregiver understands and will follow disposition?: Yes   Copied from CRM #[id number]. Topic: Clinical - Red Word Triage >> May 27, 2024 11:18 AM Terri MATSU wrote: [facility] that prompted transfer to Nurse Triage: Patient has a headache, sore throat, bad cough and a runny nose since last night and teeth was aching Reason for Disposition  SEVERE throat pain (e.g., excruciating)  Answer Assessment - Initial Assessment Questions No available appts with pcp. Scheduled appt with alternative provider.  Advised call back or ED if symptoms worsen.    At home Covid positive  1. ONSET: When did the throat start hurting? (Hours or days ago)      Last night;  2. SEVERITY: How bad is the sore throat? (Scale 1-10; mild, moderate or severe)     Moderate to severe, able to swallow  fluids and salvia, denies drooling 3. STREP EXPOSURE: Has there been any exposure to strep within the past week? If Yes, ask: What type of contact occurred?      unsure 4.  VIRAL SYMPTOMS: Are there any symptoms of a cold, such as a runny nose, cough, hoarse voice or red eyes?      Nasal and chest congestion, yellowish phlegm, runny nose, cough 5. FEVER: Do you have a fever? If Yes, ask: What is your temperature, how was it measured, and when did it start?     Denies fever chills vomiting; reports nausea from coughing spell 6. PUS ON THE TONSILS: Is there pus on the tonsils in the back of your throat?     no 7. OTHER SYMPTOMS: Do you have any other symptoms? (e.g., difficulty breathing, headache, rash)     Ha, sore throat, congestion runny nose,  cough, low energy, aching  Protocols used: Sore Throat-A-AH

## 2024-06-11 ENCOUNTER — Other Ambulatory Visit: Payer: Self-pay | Admitting: Medical Genetics

## 2024-06-18 ENCOUNTER — Telehealth: Admitting: Internal Medicine

## 2024-06-18 ENCOUNTER — Encounter: Payer: Self-pay | Admitting: Internal Medicine

## 2024-06-18 DIAGNOSIS — R739 Hyperglycemia, unspecified: Secondary | ICD-10-CM | POA: Diagnosis not present

## 2024-06-18 DIAGNOSIS — E559 Vitamin D deficiency, unspecified: Secondary | ICD-10-CM | POA: Diagnosis not present

## 2024-06-18 DIAGNOSIS — E7849 Other hyperlipidemia: Secondary | ICD-10-CM | POA: Diagnosis not present

## 2024-06-18 DIAGNOSIS — E538 Deficiency of other specified B group vitamins: Secondary | ICD-10-CM | POA: Diagnosis not present

## 2024-06-18 MED ORDER — EZETIMIBE 10 MG PO TABS: 10.0000 mg | ORAL_TABLET | Freq: Every day | ORAL | 3 refills | Status: AC

## 2024-06-18 MED ORDER — ROSUVASTATIN CALCIUM 40 MG PO TABS: 40.0000 mg | ORAL_TABLET | Freq: Every day | ORAL | 3 refills | Status: AC

## 2024-06-18 NOTE — Assessment & Plan Note (Signed)
 Lab Results  Component Value Date   LDLCALC 72 08/09/2023   Uncontrolled mildly, pt to continue current statin crestor  40 mg every day, and zetia  10 every day and lower cholesterol diet, declines other change for now

## 2024-06-18 NOTE — Patient Instructions (Signed)
Please continue all other medications as before, and refills have been done if requested.  Please have the pharmacy call with any other refills you may need.  Please continue your efforts at being more active, low cholesterol diet, and weight control.  Please keep your appointments with your specialists as you may have planned     

## 2024-06-18 NOTE — Assessment & Plan Note (Signed)
 Lab Results  Component Value Date   VITAMINB12 >1537 (H) 08/09/2023   Stable, cont oral replacement - b12 1000 mcg qd

## 2024-06-18 NOTE — Assessment & Plan Note (Signed)
 Last vitamin D  Lab Results  Component Value Date   VD25OH 52.84 08/09/2023   Stable, cont oral replacement

## 2024-06-18 NOTE — Progress Notes (Signed)
 Patient ID: [name] [name], male   DOB: 05-20-1956, 68 y.o.   MRN: [id number]  Virtual Visit via Video Note  I connected with [name] [name] on 06/18/24 at 10:00 AM EST by a video enabled telemedicine application and verified that I am speaking with the correct person using two identifiers.  Location of all participants today Patient: at home Provider: at office   I discussed the limitations of evaluation and management by telemedicine and the availability of in person appointments. The patient expressed understanding and agreed to proceed.  History of Present Illness: Here to f/u overall doing ok, has some questions about my coming retirement and need for refills today; Pt denies chest pain, increased sob or doe, wheezing, orthopnea, PND, increased LE swelling, palpitations, dizziness or syncope.   Pt denies polydipsia, polyuria, or new focal neuro s/s.    Pt denies fever, wt loss, night sweats, loss of appetite, or other constitutional symptoms     Observations/Objective: Alert, NAD, appropriate mood and affect, resps normal, cn 2-12 intact, moves all 4s, no visible rash or swelling Lab Results  Component Value Date   WBC 15.9 (H) 08/09/2023   HGB 15.2 08/09/2023   HCT 46.1 08/09/2023   PLT 256.0 08/09/2023   GLUCOSE 87 08/09/2023   CHOL 150 08/09/2023   TRIG 90.0 08/09/2023   HDL 59.70 08/09/2023   LDLDIRECT 130.7 10/16/2011   LDLCALC 72 08/09/2023   ALT 30 08/09/2023   AST 24 08/09/2023   NA 141 08/09/2023   K 3.8 08/09/2023   CL 99 08/09/2023   CREATININE 0.88 08/09/2023   BUN 24 (H) 08/09/2023   CO2 30 08/09/2023   TSH 1.82 08/09/2023   PSA 1.50 08/09/2023   HGBA1C 5.9 08/09/2023    Assessment and Plan: See notes  Follow Up Instructions: See notes   I discussed the assessment and treatment plan with the patient. The patient was provided an opportunity to ask questions and all were answered. The patient agreed with the plan and demonstrated an understanding of  the instructions.   The patient was advised to call back or seek an in-person evaluation if the symptoms worsen or if the condition fails to improve as anticipated.   Lynwood Rush, MD

## 2024-06-18 NOTE — Assessment & Plan Note (Signed)
 Lab Results  Component Value Date   HGBA1C 5.9 08/09/2023   Stable, pt to continue current medical treatment  - diet, wt control

## 2024-06-24 DIAGNOSIS — K080 Exfoliation of teeth due to systemic causes: Secondary | ICD-10-CM | POA: Diagnosis not present

## 2024-06-26 ENCOUNTER — Telehealth: Payer: Self-pay | Admitting: Internal Medicine

## 2024-06-26 NOTE — Telephone Encounter (Signed)
 Copied from CRM #[id number]. Topic: Appointments - Scheduling Inquiry for Clinic >> Jun 26, 2024  2:59 PM China J wrote: Reason for CRM: Patient is calling to see if Dr. Garald or Dr. Joshua would be willing to accept him as a new patient. He was previously seen by Dr. Norleen  Please call patient at [phone] for an update.

## 2024-06-27 NOTE — Telephone Encounter (Signed)
 Patient has been made aware that Dr.  Joshua is not taking anymore new patient at this time

## 2024-07-07 ENCOUNTER — Ambulatory Visit: Payer: Self-pay

## 2024-07-07 NOTE — Telephone Encounter (Signed)
 FYI Only or Action Required?: FYI only for provider: appointment scheduled on 07/08/24.  Patient was last seen in primary care on 06/18/2024 by Norleen Lynwood ORN, MD.  Called Nurse Triage reporting Mass. He suspected enlarged lymph node.  Symptoms began yesterday.  Interventions attempted: OTC medications: ibuprofen and heat.  Symptoms are: unchanged.  Triage Disposition: See Physician Within 24 Hours  Patient/caregiver understands and will follow disposition?: Yes Reason for Disposition  [1] Swelling is painful to touch AND [2] no fever  Answer Assessment - Initial Assessment Questions Tenderness and bump behind right ear that he first noticed yesterday. Has been applying heat and taking ibuprofen with mild improvement, but still present. Last illness was covid over one month ago.  1. APPEARANCE of SWELLING: What does it look like?     Behind ear, not visible 2. SIZE: How large is the swelling? (e.g., inches, cm; or compare to size of pinhead, tip of pen, eraser, coin, pea, grape, ping pong ball)      Dime 3. LOCATION: Where is the swelling located?     Behind right ear 4. ONSET: When did the swelling start?     07/06/24 5. COLOR: What color is it? Is there more than one color?     Unknown 6. PAIN: Is there any pain? If Yes, ask: How bad is the pain? (Scale 1-10; or mild, moderate, severe)       Tender 9 OTHER SYMPTOMS: Do you have any other symptoms? (e.g., fever)     Denies  Protocols used: Skin Lump or Localized Swelling-A-AH Copied from CRM #[id number]. Topic: Clinical - Red Word Triage >> Jul 07, 2024  9:33 AM Suzen RAMAN wrote: Red Word that prompted transfer to Nurse Triage:  painful swollen lymp nodules under right ear/jaw,  feels firm apply heat and take ibprofen but unresolved;  not sure if he needs an appt.

## 2024-07-08 ENCOUNTER — Encounter: Payer: Self-pay | Admitting: Internal Medicine

## 2024-07-08 ENCOUNTER — Ambulatory Visit: Admitting: Internal Medicine

## 2024-07-08 VITALS — BP 120/82 | HR 73 | Temp 98.3°F | Ht 71.0 in | Wt 190.0 lb

## 2024-07-08 DIAGNOSIS — E559 Vitamin D deficiency, unspecified: Secondary | ICD-10-CM

## 2024-07-08 DIAGNOSIS — R739 Hyperglycemia, unspecified: Secondary | ICD-10-CM

## 2024-07-08 DIAGNOSIS — L040 Acute lymphadenitis of face, head and neck: Secondary | ICD-10-CM

## 2024-07-08 MED ORDER — AMOXICILLIN-POT CLAVULANATE 875-125 MG PO TABS: 1.0000 | ORAL_TABLET | Freq: Two times a day (BID) | ORAL | 0 refills | Status: AC

## 2024-07-08 NOTE — Patient Instructions (Signed)
 Please take all new medication as prescribed  - the antibiotic  Please continue all other medications as before, and refills have been done if requested.  Please have the pharmacy call with any other refills you may need  Please keep your appointments with your specialists as you may have planned  We may need to consider CT scan or ENT referral if not improved

## 2024-07-08 NOTE — Progress Notes (Unsigned)
 Patient ID: [name] [name], male   DOB: 1955-10-24, 68 y.o.   MRN: [id number]        Chief Complaint: follow up acute lymphadenitis, hyperglycemia, low vit d       HPI:  [name] [name] is a 68 y.o. male here with c/o new onset 3 days tender knot just under the skin at the right angle of jaw, but denies HA, ear pain, sinus pain, cough, ST, dental problems, and Pt denies chest pain, increased sob or doe, wheezing, orthopnea, PND, increased LE swelling, palpitations, dizziness or syncope.  No sick contacts.  Has not occurred in the past like this.  No high fever, chills.   Pt denies polydipsia, polyuria, or new focal neuro s/s.        Wt Readings from Last 3 Encounters:  07/08/24 190 lb (86.2 kg)  05/27/24 185 lb 4.8 oz (84.1 kg)  10/09/23 190 lb (86.2 kg)   BP Readings from Last 3 Encounters:  07/08/24 120/82  05/27/24 122/60  10/09/23 100/66         Past Medical History:  Diagnosis Date   Anxiety    Arthritis    hx in thumbs currently in shoulders and knees   Cancer (HCC)    Depression    GERD (gastroesophageal reflux disease)    History of colon polyps    History of kidney stones    History of skin cancer    squamous   Hyperlipidemia    Kidney stones    RBBB    NOTED IN 2006   Past Surgical History:  Procedure Laterality Date   CARDIOVASCULAR STRESS TEST  09-06-2007  DR NISHAN   NORMAL NUCLEAR STUDY/  NO ISCHEMIA/  EF 53%   COLONOSCOPY  2007  POLYPECTOMY/   2012  NEGATIVE   neg 07/06/2099   CYSTOSCOPY W/ URETERAL STENT REMOVAL Bilateral 07/07/2013   Procedure: CYSTOSCOPY WITH STENT REMOVAL;  Surgeon: Ricardo Likens, MD;  Location: [facility];  Service: Urology;  Laterality: Bilateral;   CYSTOSCOPY WITH RETROGRADE PYELOGRAM, URETEROSCOPY AND STENT PLACEMENT Bilateral 06/18/2013   Procedure: CYSTOSCOPY WITH RETROGRADE PYELOGRAM, URETEROSCOPY AND STENT PLACEMENT;  Surgeon: Ricardo Likens, MD;  Location: [facility];  Service: Urology;   Laterality: Bilateral;   EXTRACORPOREAL SHOCK WAVE LITHOTRIPSY Left JUNE 2009   HOLMIUM LASER APPLICATION Bilateral 06/18/2013   Procedure: HOLMIUM LASER APPLICATION;  Surgeon: Ricardo Likens, MD;  Location[facility];  Service: Urology;  Laterality: Bilateral;   KNEE ARTHROSCOPY W/ MENISCAL REPAIR Right MAY 2014   LEFT URETEROSCOPIC STONE EXTRACTION  04-04-2008   RETINAL TEAR REPAIR CRYOTHERAPY Left 09/2014   Repaired by laser surgery   SHOULDER SURGERY Right    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 Zell Mussel   TONSILLECTOMY AND ADENOIDECTOMY  AS CHILD   UPPER GASTROINTESTINAL ENDOSCOPY  2011   GERD    reports that he has never smoked. He has never used smokeless tobacco. He reports current alcohol use of about 14.0 standard drinks of alcohol per week. He reports that he does not use drugs. family history includes Cancer in his mother; Crohn's disease in his cousin; Dementia in his mother; Diabetes in his father; Heart disease in his father, maternal grandfather, and maternal grandmother; Hypertension in his mother; Pancreatic cancer in his paternal aunt. Allergies[1] Medications Ordered Prior to Encounter[2]      ROS:  All others reviewed and negative.  Objective        PE:  BP 120/82 (BP Location: Right Arm, Patient Position: Sitting, Cuff Size: Normal)   Pulse 73   Temp 98.3 F (36.8 C) (Oral)   Ht 5' 11 (1.803 m)   Wt 190 lb (86.2 kg)   SpO2 98%   BMI 26.50 kg/m                 Constitutional: Pt appears non toxic, mildly ill               HENT: Head: NCAT.                Right Ear: External ear normal.                 Left Ear: External ear normal.                Eyes: . Pupils are equal, round, and reactive to light. Conjunctivae and EOM are normal.  TM's clear bilateral               Nose: without d/c or deformity               Neck: Neck supple. Gross normal ROM,  but has at least 2 tender probable lymphadenopathy at the right angle of jaw and near at the start of the post chain to the right SCM, no overlying skin change               Cardiovascular: Normal rate and regular rhythm.                 Pulmonary/Chest: Effort normal and breath sounds without rales or wheezing.                               Neurological: Pt is alert. At baseline orientation, motor grossly intact               Skin: Skin is warm. No rashes, no other new lesions, LE edema - none               Psychiatric: Pt behavior is normal without agitation   Micro: none  Cardiac tracings I have personally interpreted today:  none  Pertinent Radiological findings (summarize): none   Lab Results  Component Value Date   WBC 15.9 (H) 08/09/2023   HGB 15.2 08/09/2023   HCT 46.1 08/09/2023   PLT 256.0 08/09/2023   GLUCOSE 87 08/09/2023   CHOL 150 08/09/2023   TRIG 90.0 08/09/2023   HDL 59.70 08/09/2023   LDLDIRECT 130.7 10/16/2011   LDLCALC 72 08/09/2023   ALT 30 08/09/2023   AST 24 08/09/2023   NA 141 08/09/2023   K 3.8 08/09/2023   CL 99 08/09/2023   CREATININE 0.88 08/09/2023   BUN 24 (H) 08/09/2023   CO2 30 08/09/2023   TSH 1.82 08/09/2023   PSA 1.50 08/09/2023   HGBA1C 5.9 08/09/2023   Assessment/Plan:  [name] [name] is a 68 y.o. White or Caucasian [1] male with  has a past medical history of Anxiety, Arthritis, Cancer (HCC), Depression, GERD (gastroesophageal reflux disease), History of colon polyps, History of kidney stones, History of skin cancer, Hyperlipidemia, Kidney stones, and RBBB.  Acute lymphadenitis of neck Etiology unclear, pt non toxic and afeb, declines lab or xray, but will need trial antibiotic augmentin  875 mg bid course, but consider referral ENT or  even CT neck for persistence or worsening  Hyperglycemia Lab Results  Component Value Date   HGBA1C 5.9 08/09/2023   Stable, pt to continue current medical treatment  - diet, ,wt  control   Vitamin D  deficiency Last vitamin D  Lab Results  Component Value Date   VD25OH 52.84 08/09/2023   Stable, cont oral replacement  Followup: Return if symptoms worsen or fail to improve.  Lynwood Rush, MD 07/09/2024 9:44 AM [facility]    [1]  Allergies Allergen Reactions   Codeine Nausea And Vomiting   Oxycodone  Itching  [2]  Current Outpatient Medications on File Prior to Visit  Medication Sig Dispense Refill   aspirin 81 MG tablet Take 81 mg by mouth daily.     buPROPion (WELLBUTRIN XL) 300 MG 24 hr tablet Take 300 mg by mouth daily.     Cholecalciferol (VITAMIN D3) 100000 UNIT/GM POWD by Does not apply route.     ezetimibe  (ZETIA ) 10 MG tablet Take 1 tablet (10 mg total) by mouth daily. 90 tablet 3   indapamide (LOZOL) 2.5 MG tablet Take 2.5 mg by mouth daily.     LORazepam (ATIVAN) 1 MG tablet Take 1 mg by mouth 2 (two) times daily.     omeprazole  (PRILOSEC) 20 MG capsule TAKE 1 CAPSULE BY MOUTH ONCE DAILY 90 capsule 3   rosuvastatin  (CRESTOR ) 40 MG tablet Take 1 tablet (40 mg total) by mouth daily. 90 tablet 3   sildenafil  (VIAGRA ) 100 MG tablet Take 0.5-1 tablets (50-100 mg total) by mouth daily as needed for erectile dysfunction. 5 tablet 11   No current facility-administered medications on file prior to visit.

## 2024-07-09 ENCOUNTER — Encounter: Payer: Self-pay | Admitting: Internal Medicine

## 2024-07-09 DIAGNOSIS — L040 Acute lymphadenitis of face, head and neck: Secondary | ICD-10-CM | POA: Insufficient documentation

## 2024-07-09 NOTE — Assessment & Plan Note (Signed)
 Lab Results  Component Value Date   HGBA1C 5.9 08/09/2023   Stable, pt to continue current medical treatment  - diet, ,wt control

## 2024-07-09 NOTE — Assessment & Plan Note (Signed)
 Last vitamin D  Lab Results  Component Value Date   VD25OH 52.84 08/09/2023   Stable, cont oral replacement

## 2024-07-09 NOTE — Assessment & Plan Note (Signed)
 Etiology unclear, pt non toxic and afeb, declines lab or xray, but will need trial antibiotic augmentin  875 mg bid course, but consider referral ENT or even CT neck for persistence or worsening

## 2024-09-18 ENCOUNTER — Ambulatory Visit

## 2024-09-19 ENCOUNTER — Ambulatory Visit
